# Patient Record
Sex: Female | Born: 1970 | Race: White | Hispanic: No | Marital: Married | Smoking: Current every day smoker
Health system: Southern US, Community
[De-identification: ages and names within clinical notes are randomized; demographics above are authoritative.]

## PROBLEM LIST (undated history)

## (undated) ENCOUNTER — Inpatient Hospital Stay (HOSPITAL_COMMUNITY): Payer: Self-pay

## (undated) DIAGNOSIS — T8859XA Other complications of anesthesia, initial encounter: Secondary | ICD-10-CM

## (undated) DIAGNOSIS — N879 Dysplasia of cervix uteri, unspecified: Secondary | ICD-10-CM

## (undated) DIAGNOSIS — O24419 Gestational diabetes mellitus in pregnancy, unspecified control: Secondary | ICD-10-CM

## (undated) DIAGNOSIS — Z8669 Personal history of other diseases of the nervous system and sense organs: Secondary | ICD-10-CM

## (undated) DIAGNOSIS — M1990 Unspecified osteoarthritis, unspecified site: Secondary | ICD-10-CM

## (undated) DIAGNOSIS — L719 Rosacea, unspecified: Secondary | ICD-10-CM

## (undated) DIAGNOSIS — Z720 Tobacco use: Secondary | ICD-10-CM

## (undated) DIAGNOSIS — L80 Vitiligo: Secondary | ICD-10-CM

## (undated) DIAGNOSIS — K141 Geographic tongue: Secondary | ICD-10-CM

## (undated) DIAGNOSIS — F429 Obsessive-compulsive disorder, unspecified: Secondary | ICD-10-CM

## (undated) DIAGNOSIS — A630 Anogenital (venereal) warts: Secondary | ICD-10-CM

## (undated) DIAGNOSIS — R011 Cardiac murmur, unspecified: Secondary | ICD-10-CM

## (undated) DIAGNOSIS — C801 Malignant (primary) neoplasm, unspecified: Secondary | ICD-10-CM

## (undated) DIAGNOSIS — T4145XA Adverse effect of unspecified anesthetic, initial encounter: Secondary | ICD-10-CM

## (undated) DIAGNOSIS — Z8489 Family history of other specified conditions: Secondary | ICD-10-CM

## (undated) DIAGNOSIS — F419 Anxiety disorder, unspecified: Secondary | ICD-10-CM

## (undated) DIAGNOSIS — R0981 Nasal congestion: Secondary | ICD-10-CM

## (undated) DIAGNOSIS — G4730 Sleep apnea, unspecified: Secondary | ICD-10-CM

## (undated) DIAGNOSIS — Z87442 Personal history of urinary calculi: Secondary | ICD-10-CM

## (undated) DIAGNOSIS — E559 Vitamin D deficiency, unspecified: Secondary | ICD-10-CM

## (undated) DIAGNOSIS — N6310 Unspecified lump in the right breast, unspecified quadrant: Secondary | ICD-10-CM

## (undated) HISTORY — PX: GYNECOLOGIC CRYOSURGERY: SHX857

## (undated) HISTORY — PX: OTHER SURGICAL HISTORY: SHX169

## (undated) HISTORY — PX: MICRODISCECTOMY LUMBAR: SUR864

## (undated) HISTORY — DX: Personal history of other diseases of the nervous system and sense organs: Z86.69

## (undated) HISTORY — DX: Anogenital (venereal) warts: A63.0

## (undated) HISTORY — DX: Vitiligo: L80

## (undated) HISTORY — DX: Obsessive-compulsive disorder, unspecified: F42.9

## (undated) HISTORY — DX: Cardiac murmur, unspecified: R01.1

## (undated) HISTORY — DX: Sleep apnea, unspecified: G47.30

## (undated) HISTORY — DX: Dysplasia of cervix uteri, unspecified: N87.9

## (undated) HISTORY — PX: REDUCTION MAMMAPLASTY: SUR839

## (undated) HISTORY — DX: Unspecified osteoarthritis, unspecified site: M19.90

## (undated) HISTORY — DX: Anxiety disorder, unspecified: F41.9

## (undated) HISTORY — PX: BREAST EXCISIONAL BIOPSY: SUR124

## (undated) HISTORY — DX: Vitamin D deficiency, unspecified: E55.9

## (undated) HISTORY — DX: Geographic tongue: K14.1

## (undated) HISTORY — DX: Tobacco use: Z72.0

## (undated) HISTORY — DX: Rosacea, unspecified: L71.9

## (undated) HISTORY — PX: BREAST SURGERY: SHX581

---
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
## 1998-08-26 HISTORY — PX: TONSILLECTOMY: SUR1361

## 2006-08-26 HISTORY — PX: ABDOMINOPLASTY: SUR9

## 2007-01-28 ENCOUNTER — Encounter: Admission: RE | Admit: 2007-01-28 | Discharge: 2007-01-28 | Payer: Self-pay | Admitting: Family Medicine

## 2007-02-02 ENCOUNTER — Encounter: Admission: RE | Admit: 2007-02-02 | Discharge: 2007-02-02 | Payer: Self-pay | Admitting: Family Medicine

## 2007-07-07 ENCOUNTER — Other Ambulatory Visit: Admission: RE | Admit: 2007-07-07 | Discharge: 2007-07-07 | Payer: Self-pay | Admitting: Obstetrics and Gynecology

## 2008-04-04 ENCOUNTER — Encounter: Admission: RE | Admit: 2008-04-04 | Discharge: 2008-04-04 | Payer: Self-pay | Admitting: Family Medicine

## 2008-07-13 ENCOUNTER — Other Ambulatory Visit: Admission: RE | Admit: 2008-07-13 | Discharge: 2008-07-13 | Payer: Self-pay | Admitting: Family Medicine

## 2008-08-09 ENCOUNTER — Encounter: Admission: RE | Admit: 2008-08-09 | Discharge: 2008-08-09 | Payer: Self-pay | Admitting: Family Medicine

## 2009-05-30 ENCOUNTER — Encounter: Admission: RE | Admit: 2009-05-30 | Discharge: 2009-05-30 | Payer: Self-pay | Admitting: Family Medicine

## 2009-08-03 ENCOUNTER — Other Ambulatory Visit: Admission: RE | Admit: 2009-08-03 | Discharge: 2009-08-03 | Payer: Self-pay | Admitting: Family Medicine

## 2009-08-14 ENCOUNTER — Ambulatory Visit: Payer: Self-pay | Admitting: Gynecology

## 2009-09-13 ENCOUNTER — Ambulatory Visit: Payer: Self-pay | Admitting: Gynecology

## 2009-10-11 ENCOUNTER — Ambulatory Visit: Payer: Self-pay | Admitting: Gynecology

## 2009-10-12 ENCOUNTER — Ambulatory Visit: Payer: Self-pay | Admitting: Gynecology

## 2010-01-31 ENCOUNTER — Ambulatory Visit: Payer: Self-pay | Admitting: Gynecology

## 2010-03-09 ENCOUNTER — Ambulatory Visit: Payer: Self-pay | Admitting: Gynecology

## 2010-03-22 ENCOUNTER — Ambulatory Visit: Payer: Self-pay | Admitting: Gynecology

## 2010-09-16 ENCOUNTER — Encounter: Payer: Self-pay | Admitting: Family Medicine

## 2010-10-01 ENCOUNTER — Encounter: Payer: Self-pay | Admitting: Gynecology

## 2010-10-01 ENCOUNTER — Other Ambulatory Visit (HOSPITAL_COMMUNITY)
Admission: RE | Admit: 2010-10-01 | Discharge: 2010-10-01 | Disposition: A | Discharge status: Home / Self Care | Payer: BC Managed Care – PPO | Source: Ambulatory Visit | Attending: Gynecology | Admitting: Gynecology

## 2010-10-01 ENCOUNTER — Other Ambulatory Visit: Payer: Self-pay | Admitting: Gynecology

## 2010-10-01 DIAGNOSIS — R5383 Other fatigue: Secondary | ICD-10-CM

## 2010-10-01 DIAGNOSIS — Z833 Family history of diabetes mellitus: Secondary | ICD-10-CM

## 2010-10-01 DIAGNOSIS — Z01419 Encounter for gynecological examination (general) (routine) without abnormal findings: Secondary | ICD-10-CM

## 2010-10-01 DIAGNOSIS — Z13220 Encounter for screening for lipoid disorders: Secondary | ICD-10-CM

## 2010-10-01 DIAGNOSIS — R5381 Other malaise: Secondary | ICD-10-CM

## 2010-10-01 DIAGNOSIS — Z124 Encounter for screening for malignant neoplasm of cervix: Secondary | ICD-10-CM | POA: Insufficient documentation

## 2010-11-26 ENCOUNTER — Other Ambulatory Visit: Payer: BC Managed Care – PPO

## 2011-03-25 ENCOUNTER — Encounter: Payer: Self-pay | Admitting: *Deleted

## 2011-03-26 ENCOUNTER — Ambulatory Visit (INDEPENDENT_AMBULATORY_CARE_PROVIDER_SITE_OTHER): Payer: BC Managed Care – PPO | Admitting: Gynecology

## 2011-03-26 ENCOUNTER — Encounter: Payer: Self-pay | Admitting: Gynecology

## 2011-03-26 DIAGNOSIS — Z113 Encounter for screening for infections with a predominantly sexual mode of transmission: Secondary | ICD-10-CM

## 2011-03-26 DIAGNOSIS — E559 Vitamin D deficiency, unspecified: Secondary | ICD-10-CM

## 2011-03-26 DIAGNOSIS — N898 Other specified noninflammatory disorders of vagina: Secondary | ICD-10-CM

## 2011-03-26 DIAGNOSIS — B373 Candidiasis of vulva and vagina: Secondary | ICD-10-CM

## 2011-03-26 DIAGNOSIS — K649 Unspecified hemorrhoids: Secondary | ICD-10-CM

## 2011-03-26 MED ORDER — FLUCONAZOLE 150 MG PO TABS: 150.0000 mg | ORAL_TABLET | Freq: Once | ORAL | Status: AC

## 2011-03-26 NOTE — Progress Notes (Signed)
Patient presentscomplaining of vaginal discharge for several days with itching think she has a yeast infection. She also requested STD screening. She has no known exposure but wants to be checked for STD. She also has been noted to have a low vitamin D asked if she could have her vitamin D level rechecked. She had been on 50,000 units weekly through the winter but then never followed up for a recheck. Lastly she is complaining of hemorrhoid comes and goes.  Exam today: External BUS the vagina with white discharge KOH wet prep done cervix grossly normal GC Chlamydia screen done IUD string not visualized bimanual uterus normal size midline mobile nontender adnexa without masses or tenderness rectal exam shows small hemorrhoidal skin tag digital rectal exam is normal.  Assessment #1 Vaginal discharge KOH were prepped is positive for yeast we'll treat with Diflucan 150x1 dose I gave her 3 refills as she does have recurrences.  #2 Patient requests STD screening no known exposure will check GC chlamydia RPR hep B hep C HIV. #3 IUD management IUD string was not visualized but it has not been visualized in the past and she had an ultrasound in February of last year that showed intrauterine placement. She remains amenorrheic. Do not feel any further evaluation necessary. #4 Complaints of hemorrhoid exam today shows a small hemorrhoidal tag no overt hemorrhoids. Patient notes that it seems to pop in and out is bothersome to her I offered her a general surgical referral and she wants to go ahead and do this I went ahead and arrange this today. She knows if she does not hear from [place] as far as a referral within 2 weeks to call us. #5 Patient's been known to have low vitamin D in the past has been taking a vitamin D supplement that I went ahead and recheck a vitamin D level today she'll followup with these results.

## 2011-03-27 ENCOUNTER — Encounter: Payer: Self-pay | Admitting: Gynecology

## 2011-03-27 ENCOUNTER — Encounter: Payer: Self-pay | Admitting: *Deleted

## 2011-03-27 ENCOUNTER — Other Ambulatory Visit: Payer: Self-pay | Admitting: Gynecology

## 2011-03-27 LAB — HIV ANTIBODY (ROUTINE TESTING W REFLEX): HIV: NONREACTIVE

## 2011-03-27 LAB — RPR

## 2011-03-27 LAB — HEPATITIS C ANTIBODY: HCV Ab: NEGATIVE

## 2011-03-27 LAB — HEPATITIS B SURFACE ANTIGEN: Hepatitis B Surface Ag: NEGATIVE

## 2011-03-27 LAB — VITAMIN D 25 HYDROXY (VIT D DEFICIENCY, FRACTURES): Vit D, 25-Hydroxy: 35 ng/mL (ref 30–89)

## 2011-03-27 NOTE — Progress Notes (Unsigned)
  Patient was notified of appointment with [facility].  She will see Dr. Zachery Dakins on 04/04/11 @3 :50pm.

## 2011-03-27 NOTE — Progress Notes (Signed)
PT INFORMED WITH THE BELOW RE:VIT.D

## 2011-04-04 ENCOUNTER — Ambulatory Visit (INDEPENDENT_AMBULATORY_CARE_PROVIDER_SITE_OTHER): Payer: Self-pay | Admitting: General Surgery

## 2011-04-16 ENCOUNTER — Ambulatory Visit (INDEPENDENT_AMBULATORY_CARE_PROVIDER_SITE_OTHER): Payer: BC Managed Care – PPO | Admitting: General Surgery

## 2011-05-13 ENCOUNTER — Telehealth: Payer: Self-pay | Admitting: *Deleted

## 2011-05-13 DIAGNOSIS — N898 Other specified noninflammatory disorders of vagina: Secondary | ICD-10-CM

## 2011-05-13 MED ORDER — METRONIDAZOLE 500 MG PO TABS: 500.0000 mg | ORAL_TABLET | Freq: Two times a day (BID) | ORAL | Status: AC

## 2011-05-13 NOTE — Telephone Encounter (Signed)
Pt informed with the below note and make appointment if s/s continue after taking medications.

## 2011-05-13 NOTE — Telephone Encounter (Signed)
Flagyl 500 mg twice a Pepin x7 days. Alcohol avoidance. Office visit if symptoms persist

## 2011-05-13 NOTE — Telephone Encounter (Signed)
Pt called c/o foul odor x 1 Goble. Pt would like rx for this, she states that you give this to her when she has recurrent infections. Please advise if pt can have rx for this. Last office visit 10/01/10.

## 2011-05-15 ENCOUNTER — Encounter (INDEPENDENT_AMBULATORY_CARE_PROVIDER_SITE_OTHER): Payer: Self-pay | Admitting: General Surgery

## 2011-05-15 ENCOUNTER — Ambulatory Visit (INDEPENDENT_AMBULATORY_CARE_PROVIDER_SITE_OTHER): Payer: BC Managed Care – PPO | Admitting: General Surgery

## 2011-05-15 VITALS — BP 122/82 | HR 60 | Temp 97.6°F | Resp 16 | Ht 64.0 in | Wt 172.0 lb

## 2011-05-15 DIAGNOSIS — K644 Residual hemorrhoidal skin tags: Secondary | ICD-10-CM

## 2011-05-15 NOTE — Progress Notes (Signed)
Subjective:     Patient ID: Mackenzie Meyers, female   DOB: 1971-07-15, 40 y.o.   MRN: [id number]  HPIThe patient is a 40 year old mother of 2 and said to her second pregnancy approximately 5 years ago she started having mild episodes of problems with hemorrhoids. At present she does have an episode of prolapse and hemorrhoids intermittently that usually spontaneously reduces. She is interested whether this could be surgically managed and I reviewed the booklet on hemorrhoids with her. She's never had a previous colonoscopy and bleeding is not a significant problem   Review of Systems Current Outpatient Prescriptions  Medication Sig Dispense Refill  . Citalopram Hydrobromide (CELEXA PO) Take by mouth.        . levonorgestrel (MIRENA) 20 MCG/24HR IUD 1 each by Intrauterine route once. Inserted 09/13/09       . Multiple Vitamin (MULTIVITAMIN) capsule Take 1 capsule by mouth daily.        . metroNIDAZOLE (FLAGYL) 500 MG tablet Take 1 tablet (500 mg total) by mouth 2 (two) times daily. For 7 days.  Avoid alcohol while taking  14 tablet  0   Allergies  Allergen Reactions  . Ampicillin   . Erythrocin    Past Surgical History  Procedure Date  . Cesarean section 2004 and 2007  . Tonsillectomy 2000  . Breast enhancement surgery 2008  . Abdominoplasty   . Intrauterine device insertion 08/2009    Mirena        Objective:   Physical Exam On physical exam predominantly to the anus she is not have any obvious external thrombosed own large hemorrhoids at a careful anoscopic exam she's got mild internal hemorrhoids in all 3 quadrants noted larger than the others and she says that the area of the left is probably the hemorrhoid the intermittently she says she does home of [place] [employer] straining that sometimes diuretics her hemorrhoids. Her present weight is about 170 pounds her maximum weight was after her pregnancy of about 200 to    Assessment:    Mild internal hemorrhoid with intermittent prolapse.  She does have a history of vaginal infection and has full prescription for Diflucan and was given a prescription 200 mg 2 tablets one takedown repeated approximately 4 days.   Plan:      Patient has a humeral booklet will try to avoid prolonged strain in keep her bowels regular and return to see [place] if she is having problems with intermittent prolapse and if this is an extra L. hemorrhoids it could possibly be removed here in the office and I don't see anything as far as a candidate for rubber band ligation at this time

## 2011-05-15 NOTE — Patient Instructions (Signed)
Regulate her stools to avoid constipation also avoid prolonged sitting on the toilet. If the hemorrhoid continues to intermittent prolapse but he reexamine here in the office. Possible all for surgery to correct the problem if it becomes worse

## 2011-10-08 ENCOUNTER — Encounter: Payer: BC Managed Care – PPO | Admitting: Gynecology

## 2011-10-15 ENCOUNTER — Encounter: Payer: BC Managed Care – PPO | Admitting: Gynecology

## 2011-10-17 ENCOUNTER — Other Ambulatory Visit (HOSPITAL_COMMUNITY)
Admission: RE | Admit: 2011-10-17 | Discharge: 2011-10-17 | Disposition: A | Discharge status: Home / Self Care | Payer: PRIVATE HEALTH INSURANCE | Source: Ambulatory Visit | Attending: Gynecology | Admitting: Gynecology

## 2011-10-17 ENCOUNTER — Encounter: Payer: Self-pay | Admitting: Gynecology

## 2011-10-17 ENCOUNTER — Ambulatory Visit (INDEPENDENT_AMBULATORY_CARE_PROVIDER_SITE_OTHER): Payer: PRIVATE HEALTH INSURANCE | Admitting: Gynecology

## 2011-10-17 VITALS — BP 120/74 | Ht 64.0 in | Wt 174.0 lb

## 2011-10-17 DIAGNOSIS — Z13220 Encounter for screening for lipoid disorders: Secondary | ICD-10-CM

## 2011-10-17 DIAGNOSIS — L80 Vitiligo: Secondary | ICD-10-CM

## 2011-10-17 DIAGNOSIS — E559 Vitamin D deficiency, unspecified: Secondary | ICD-10-CM

## 2011-10-17 DIAGNOSIS — Z01419 Encounter for gynecological examination (general) (routine) without abnormal findings: Secondary | ICD-10-CM | POA: Insufficient documentation

## 2011-10-17 DIAGNOSIS — F419 Anxiety disorder, unspecified: Secondary | ICD-10-CM | POA: Insufficient documentation

## 2011-10-17 DIAGNOSIS — Z113 Encounter for screening for infections with a predominantly sexual mode of transmission: Secondary | ICD-10-CM

## 2011-10-17 DIAGNOSIS — Z131 Encounter for screening for diabetes mellitus: Secondary | ICD-10-CM

## 2011-10-17 LAB — LIPID PANEL
Cholesterol: 163 mg/dL (ref 0–200)
HDL: 61 mg/dL (ref >39)
LDL Cholesterol: 93 mg/dL (ref 0–99)
Total CHOL/HDL Ratio: 2.7 Ratio
Triglycerides: 45 mg/dL (ref <150)
VLDL: 9 mg/dL (ref 0–40)

## 2011-10-17 LAB — CBC WITH DIFFERENTIAL/PLATELET
Basophils Absolute: 0 10*3/uL (ref 0.0–0.1)
Basophils Relative: 0 % (ref 0–1)
Eosinophils Absolute: 0.1 10*3/uL (ref 0.0–0.7)
Eosinophils Relative: 1 % (ref 0–5)
HCT: 44.3 % (ref 36.0–46.0)
Hemoglobin: 14.4 g/dL (ref 12.0–15.0)
Lymphocytes Relative: 23 % (ref 12–46)
Lymphs Abs: 2.1 10*3/uL (ref 0.7–4.0)
MCH: 29.8 pg (ref 26.0–34.0)
MCHC: 32.5 g/dL (ref 30.0–36.0)
MCV: 91.7 fL (ref 78.0–100.0)
Monocytes Absolute: 0.7 10*3/uL (ref 0.1–1.0)
Monocytes Relative: 8 % (ref 3–12)
Neutro Abs: 6.1 10*3/uL (ref 1.7–7.7)
Neutrophils Relative %: 68 % (ref 43–77)
Platelets: 273 10*3/uL (ref 150–400)
RBC: 4.83 MIL/uL (ref 3.87–5.11)
RDW: 14.2 % (ref 11.5–15.5)
WBC: 9 10*3/uL (ref 4.0–10.5)

## 2011-10-17 LAB — GLUCOSE, RANDOM: Glucose, Bld: 88 mg/dL (ref 70–99)

## 2011-10-17 MED ORDER — CITALOPRAM HYDROBROMIDE 20 MG PO TABS: 20.0000 mg | ORAL_TABLET | Freq: Every day | ORAL | Status: DC

## 2011-10-17 NOTE — Patient Instructions (Signed)
Call for lab work. Return in one year for annual gynecologic exam.  Consider Stop Smoking.  Help is available at [facility] smoking cessation program @ [email or web address] or 308 608 7637. OR 1-800-QUIT-NOW (715)869-7039) for free smoking cessation counseling.   Smoking Hazards Smoking cigarettes is extremely bad for your health. Tobacco smoke has over 200 known poisons in it. There are over 60 chemicals in tobacco smoke that cause cancer. Some of the chemicals found in cigarette smoke include:  Cyanide.  Benzene.  Formaldehyde.  Methanol (wood alcohol).  Acetylene (fuel used in welding torches).  Ammonia.  Cigarette smoke also contains the poisonous gases nitrogen oxide and carbon monoxide.  Cigarette smokers have an increased risk of many serious medical problems, including: Lung cancer.  Lung disease (such as pneumonia, bronchitis, and emphysema).  Heart attack and chest pain due to the heart not getting enough oxygen (angina).  Heart disease and peripheral blood vessel disease.  Hypertension.  Stroke.  Oral cancer (cancer of the lip, mouth, or voice box).  Bladder cancer.  Pancreatic cancer.  Cervical cancer.  Pregnancy complications, including premature birth.  Low birthweight babies.  Early menopause.  Lower estrogen level for women.  Infertility.  Facial wrinkles.  Blindness.  Increased risk of broken bones (fractures).  Senile dementia.  Stillbirths and smaller newborn babies, birth defects, and genetic damage to sperm.  Stomach ulcers and internal bleeding.  Children of smokers have an increased risk of the following, because of secondhand smoke exposure:  Sudden infant death syndrome (SIDS).  Respiratory infections.  Lung cancer.  Heart disease.  Ear infections.  Smoking causes approximately: 90% of all lung cancer deaths in men.  80% of all lung cancer deaths in women.  90% of deaths from chronic obstructive lung disease.  Compared with  nonsmokers, smoking increases the risk of: Coronary heart disease by 2 to 4 times.  Stroke by 2 to 4 times.  Men developing lung cancer by 23 times.  Women developing lung cancer by 13 times.  Dying from chronic obstructive lung diseases by 12 times.  Someone who smokes 2 packs a [place] loses about 8 years of his or her life. Even smoking lightly shortens your life expectancy by several years. You can greatly reduce the risk of medical problems for you and your family by stopping now. Smoking is the most preventable cause of death and disease in our society. Within days of quitting smoking, your circulation returns to normal, you decrease the risk of having a heart attack, and your lung capacity improves. There may be some increased phlegm in the first few days after quitting, and it may take months for your lungs to clear up completely. Quitting for 10 years cuts your lung cancer risk to almost that of a nonsmoker. WHY IS SMOKING ADDICTIVE? Nicotine is the chemical agent in tobacco that is capable of causing addiction or dependence.  When you smoke and inhale, nicotine is absorbed rapidly into the bloodstream through your lungs. Nicotine absorbed through the lungs is capable of creating a powerful addiction. Both inhaled and non-inhaled nicotine may be addictive.  Addiction studies of cigarettes and spit tobacco show that addiction to nicotine occurs mainly during the teen years, when young people begin using tobacco products.  WHAT ARE THE BENEFITS OF QUITTING?  There are many health benefits to quitting smoking.  Likelihood of developing cancer and heart disease decreases. Health improvements are seen almost immediately.  Blood pressure, pulse rate, and breathing patterns start returning to normal soon after  quitting.  People who quit may see an improvement in their overall quality of life.  Some people choose to quit all at once. Other options include nicotine replacement products, such as patches,  gum, and nasal sprays. Do not use these products without first checking with your caregiver. QUITTING SMOKING It is not easy to quit smoking. Nicotine is addicting, and longtime habits are hard to change. To start, you can write down all your reasons for quitting, tell your family and friends you want to quit, and ask for their help. Throw your cigarettes away, chew gum or cinnamon sticks, keep your hands busy, and drink extra water or juice. Go for walks and practice deep breathing to relax. Think of all the money you are saving: around $1,000 a year, for the average pack-a-Veloso smoker. Nicotine patches and gum have been shown to improve success at efforts to stop smoking. Zyban (bupropion) is an anti-depressant drug that can be prescribed to reduce nicotine withdrawal symptoms and to suppress the urge to smoke. Smoking is an addiction with both physical and psychological effects. Joining a stop-smoking support group can help you cope with the emotional issues. For more information and advice on programs to stop smoking, call your doctor, your local hospital, or these organizations: American Lung Association - 1-800-LUNGUSA  American Cancer Society - 1-800-ACS-2345  Document Released: 09/19/2004 Document Revised: 04/24/2011 Document Reviewed: 05/24/2009 [facility] Patient Information 2012 [place], [place].  Smoking Cessation This document explains the best ways for you to quit smoking and new treatments to help. It lists new medicines that can double or triple your chances of quitting and quitting for good. It also considers ways to avoid relapses and concerns you may have about quitting, including weight gain. NICOTINE: A POWERFUL ADDICTION If you have tried to quit smoking, you know how hard it can be. It is hard because nicotine is a very addictive drug. For some people, it can be as addictive as heroin or cocaine. Usually, people make 2 or 3 tries, or more, before finally being able to quit. Each time  you try to quit, you can learn about what helps and what hurts. Quitting takes hard work and a lot of effort, but you can quit smoking. QUITTING SMOKING IS ONE OF THE MOST IMPORTANT THINGS YOU WILL EVER DO.  You will live longer, feel better, and live better.   The impact on your body of quitting smoking is felt almost immediately:   Within 20 minutes, blood pressure decreases. Pulse returns to its normal level.   After 8 hours, carbon monoxide levels in the blood return to normal. Oxygen level increases.   After 24 hours, chance of heart attack starts to decrease. Breath, hair, and body stop smelling like smoke.   After 48 hours, damaged nerve endings begin to recover. Sense of taste and smell improve.   After 72 hours, the body is virtually free of nicotine. Bronchial tubes relax and breathing becomes easier.   After 2 to 12 weeks, lungs can hold more air. Exercise becomes easier and circulation improves.   Quitting will reduce your risk of having a heart attack, stroke, cancer, or lung disease:   After 1 year, the risk of coronary heart disease is cut in half.   After 5 years, the risk of stroke falls to the same as a nonsmoker.   After 10 years, the risk of lung cancer is cut in half and the risk of other cancers decreases significantly.   After 15 years, the  risk of coronary heart disease drops, usually to the level of a nonsmoker.   If you are pregnant, quitting smoking will improve your chances of having a healthy baby.   The people you live with, especially your children, will be healthier.   You will have extra money to spend on things other than cigarettes.  FIVE KEYS TO QUITTING Studies have shown that these 5 steps will help you quit smoking and quit for good. You have the best chances of quitting if you use them together: 1. Get ready.  2. Get support and encouragement.  3. Learn new skills and behaviors.  4. Get medicine to reduce your nicotine addiction and use  it correctly.  5. Be prepared for relapse or difficult situations. Be determined to continue trying to quit, even if you do not succeed at first.  1. GET READY  Set a quit date.   Change your environment.   Get rid of ALL cigarettes, ashtrays, matches, and lighters in your home, car, and place of work.   Do not let people smoke in your home.   Review your past attempts to quit. Think about what worked and what did not.   Once you quit, do not smoke. NOT EVEN A PUFF!  2. GET SUPPORT AND ENCOURAGEMENT Studies have shown that you have a better chance of being successful if you have help. You can get support in many ways.  Tell your family, friends, and coworkers that you are going to quit and need their support. Ask them not to smoke around you.   Talk to your caregivers (doctor, dentist, nurse, pharmacist, psychologist, and/or smoking counselor).   Get individual, group, or telephone counseling and support. The more counseling you have, the better your chances are of quitting. Programs are available at [employer] and health centers. Call your local health department for information about programs in your area.   Spiritual beliefs and practices may help some smokers quit.   Quit meters are Photographer that keep track of quit statistics, such as amount of "quit-time," cigarettes not smoked, and money saved.   Many smokers find one or more of the many self-help books available useful in helping them quit and stay off tobacco.  3. LEARN NEW SKILLS AND BEHAVIORS  Try to distract yourself from urges to smoke. Talk to someone, go for a walk, or occupy your time with a task.   When you first try to quit, change your routine. Take a different route to work. Drink tea instead of coffee. Eat breakfast in a different place.   Do something to reduce your stress. Take a hot bath, exercise, or read a book.   Plan something enjoyable to do every [place]. Reward  yourself for not smoking.   Explore interactive web-based programs that specialize in helping you quit.  4. GET MEDICINE AND USE IT CORRECTLY Medicines can help you stop smoking and decrease the urge to smoke. Combining medicine with the above behavioral methods and support can quadruple your chances of successfully quitting smoking. The U.S. Food and Drug Administration (FDA) has approved 7 medicines to help you quit smoking. These medicines fall into 3 categories.  Nicotine replacement therapy (delivers nicotine to your body without the negative effects and risks of smoking):   Nicotine gum: Available over-the-counter.   Nicotine lozenges: Available over-the-counter.   Nicotine inhaler: Available by prescription.   Nicotine nasal spray: Available by prescription.   Nicotine skin patches (transdermal): Available by  prescription and over-the-counter.   Antidepressant medicine (helps people abstain from smoking, but how this works is unknown):   Bupropion sustained-release (SR) tablets: Available by prescription.   Nicotinic receptor partial agonist (simulates the effect of nicotine in your brain):   Varenicline tartrate tablets: Available by prescription.   Ask your caregiver for advice about which medicines to use and how to use them. Carefully read the information on the package.   Everyone who is trying to quit may benefit from using a medicine. If you are pregnant or trying to become pregnant, nursing an infant, you are under age 77, or you smoke fewer than 10 cigarettes per [place], talk to your caregiver before taking any nicotine replacement medicines.   You should stop using a nicotine replacement product and call your caregiver if you experience nausea, dizziness, weakness, vomiting, fast or irregular heartbeat, mouth problems with the lozenge or gum, or redness or swelling of the skin around the patch that does not go away.   Do not use any other product containing nicotine  while using a nicotine replacement product.   Talk to your caregiver before using these products if you have diabetes, heart disease, asthma, stomach ulcers, you had a recent heart attack, you have high blood pressure that is not controlled with medicine, a history of irregular heartbeat, or you have been prescribed medicine to help you quit smoking.  5. BE PREPARED FOR RELAPSE OR DIFFICULT SITUATIONS  Most relapses occur within the first 3 months after quitting. Do not be discouraged if you start smoking again. Remember, most people try several times before they finally quit.   You may have symptoms of withdrawal because your body is used to nicotine. You may crave cigarettes, be irritable, feel very hungry, cough often, get headaches, or have difficulty concentrating.   The withdrawal symptoms are only temporary. They are strongest when you first quit, but they will go away within 10 to 14 days.  Here are some difficult situations to watch for:  Alcohol. Avoid drinking alcohol. Drinking lowers your chances of successfully quitting.   Caffeine. Try to reduce the amount of caffeine you consume. It also lowers your chances of successfully quitting.   Other smokers. Being around smoking can make you want to smoke. Avoid smokers.   Weight gain. Many smokers will gain weight when they quit, usually less than 10 pounds. Eat a healthy diet and stay active. Do not let weight gain distract you from your main goal, quitting smoking. Some medicines that help you quit smoking may also help delay weight gain. You can always lose the weight gained after you quit.   Bad mood or depression. There are a lot of ways to improve your mood other than smoking.  If you are having problems with any of these situations, talk to your caregiver. SPECIAL SITUATIONS AND CONDITIONS Studies suggest that everyone can quit smoking. Your situation or condition can give you a special reason to quit.  Pregnant women/new  mothers: By quitting, you protect your baby's health and your own.   Hospitalized patients: By quitting, you reduce health problems and help healing.   Heart attack patients: By quitting, you reduce your risk of a second heart attack.   Lung, head, and neck cancer patients: By quitting, you reduce your chance of a second cancer.   Parents of children and adolescents: By quitting, you protect your children from illnesses caused by secondhand smoke.  QUESTIONS TO THINK ABOUT Think about the following questions before  you try to stop smoking. You may want to talk about your answers with your caregiver.  Why do you want to quit?   If you tried to quit in the past, what helped and what did not?   What will be the most difficult situations for you after you quit? How will you plan to handle them?   Who can help you through the tough times? Your family? Friends? Caregiver?   What pleasures do you get from smoking? What ways can you still get pleasure if you quit?  Here are some questions to ask your caregiver:  How can you help me to be successful at quitting?   What medicine do you think would be best for me and how should I take it?   What should I do if I need more help?   What is smoking withdrawal like? How can I get information on withdrawal?  Quitting takes hard work and a lot of effort, but you can quit smoking. FOR MORE INFORMATION  Smokefree.gov (http://www.davis-[place].com/) provides free, accurate, evidence-based information and professional assistance to help support the immediate and long-term needs of people trying to quit smoking. Document Released: 08/06/2001 Document Revised: 04/24/2011 Document Reviewed: 05/29/2009 [facility] Patient Information 2012 [place], [place].

## 2011-10-17 NOTE — Progress Notes (Signed)
Mackenzie Meyers March 01, 1971 [id number]        41 y.o.  for annual exam.  Doing well.  Past medical history,surgical history, medications, allergies, family history and social history were all reviewed and documented in the EPIC chart. ROS:  Was performed and pertinent positives and negatives are included in the history.  Exam: [name] chaperone present Filed Vitals:   10/17/11 1048  BP: 120/74   General appearance  Normal Skin vitiligo changes Head/Neck normal with no cervical or supraclavicular adenopathy thyroid normal Lungs  clear Cardiac RR, without RMG Abdominal  soft, nontender, without masses, organomegaly or hernia Breasts  examined lying and sitting.  Left without masses, retractions, discharge or axillary adenopathy. Well healed lift scars.  Right with nodularity in the tail of Spence, without retractions discharge or axillary adenopathy. Well-healed lift scars. Pelvic  Ext/BUS/vagina  normal   Cervix  normal  Pap done, IUD string not visualized  Uterus  anteverted, normal size, shape and contour, midline and mobile nontender   Adnexa  Without masses or tenderness    Anus and perineum  normal   Rectovaginal  normal sphincter tone without palpated masses or tenderness.    Assessment/Plan:  41 y.o. female for annual exam.    1. IUD. Patient has Mirena IUD placed 09/13/2009 and is amenorrheic. I did not see the string as I have not seen in the past. She had an ultrasound that documented intrauterine placement. I reviewed this with her we'll plan expectant management as nothing has changed symptom-wise. 2. Right breast nodularity. Patient has stable nodularity in the right tail of Spence. 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 It has remained stable on self breast exam and she will continue to follow as long it remains stable then we'll follow this. She'll repeat her mammogram which is due at age 51  per radiology recommendation on last mammogram. 3. Pap smear. Pap was done today. 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 Assuming this Pap is normal then I discussed possible decreased frequency was every 3 years per current screening guidelines. 4. Anxiety. Patient was diagnosed with anxiety by her primary and is on Celexa. She asked about refill this as she's doing well with this I refilled her 20 mg daily x1 year. 5. STD screening. Patient asked for STD screening. She has no known exposure history but just wants to be screened. Baseline GC Chlamydia HIV RPR hepatitis B. Hepatitis C ordered. 6. Vitiligo. Patient asked about checking a TSH as this apparently associated with vitiligo and I ordered this for her. 7. Vitamin D deficiency. She is known to have a low vitamin D has not extra vitamin D now. I'm the level ordered. 8. Health maintenance. Baseline CBC glucose urinalysis and lipid profile ordered in addition to the above labs. Assuming patient continues well from a gynecologic standpoint she will see me in a year, sooner as needed.    Dara Lords MD, 11:16 AM 10/17/2011

## 2011-10-18 LAB — URINALYSIS W MICROSCOPIC + REFLEX CULTURE
Bacteria, UA: NONE SEEN
Bilirubin Urine: NEGATIVE
Casts: NONE SEEN
Crystals: NONE SEEN
Glucose, UA: NEGATIVE mg/dL
Hgb urine dipstick: NEGATIVE
Ketones, ur: NEGATIVE mg/dL
Leukocytes, UA: NEGATIVE
Nitrite: NEGATIVE
Protein, ur: NEGATIVE mg/dL
Specific Gravity, Urine: 1.008 (ref 1.005–1.030)
Squamous Epithelial / LPF: NONE SEEN
Urobilinogen, UA: 0.2 mg/dL (ref 0.0–1.0)
pH: 7 (ref 5.0–8.0)

## 2011-10-18 LAB — VITAMIN D 25 HYDROXY (VIT D DEFICIENCY, FRACTURES): Vit D, 25-Hydroxy: 56 ng/mL (ref 30–89)

## 2011-10-18 LAB — HIV ANTIBODY (ROUTINE TESTING W REFLEX): HIV: NONREACTIVE

## 2011-10-18 LAB — TSH: TSH: 1.239 u[IU]/mL (ref 0.350–4.500)

## 2011-10-18 LAB — GC/CHLAMYDIA PROBE AMP, GENITAL
Chlamydia, DNA Probe: NEGATIVE
GC Probe Amp, Genital: NEGATIVE

## 2011-10-18 LAB — RPR

## 2011-10-18 LAB — HEPATITIS B SURFACE ANTIGEN: Hepatitis B Surface Ag: NEGATIVE

## 2011-10-18 LAB — HEPATITIS C ANTIBODY: HCV Ab: NEGATIVE

## 2011-10-25 DIAGNOSIS — Z87442 Personal history of urinary calculi: Secondary | ICD-10-CM

## 2011-10-25 HISTORY — DX: Personal history of urinary calculi: Z87.442

## 2011-11-04 ENCOUNTER — Emergency Department (HOSPITAL_COMMUNITY)
Admission: EM | Admit: 2011-11-04 | Discharge: 2011-11-05 | Disposition: A | Discharge status: Home / Self Care | Payer: PRIVATE HEALTH INSURANCE | Attending: Emergency Medicine | Admitting: Emergency Medicine

## 2011-11-04 ENCOUNTER — Encounter (HOSPITAL_COMMUNITY): Payer: Self-pay | Admitting: Emergency Medicine

## 2011-11-04 ENCOUNTER — Emergency Department (HOSPITAL_COMMUNITY): Payer: PRIVATE HEALTH INSURANCE

## 2011-11-04 DIAGNOSIS — R109 Unspecified abdominal pain: Secondary | ICD-10-CM | POA: Insufficient documentation

## 2011-11-04 DIAGNOSIS — N201 Calculus of ureter: Secondary | ICD-10-CM | POA: Insufficient documentation

## 2011-11-04 MED ORDER — SODIUM CHLORIDE 0.9 % IV BOLUS (SEPSIS)
1000.0000 mL | Freq: Once | INTRAVENOUS | Status: AC
Start: 2011-11-04 — End: 2011-11-05
  Administered 2011-11-05: 1000 mL via INTRAVENOUS

## 2011-11-04 MED ORDER — HYDROMORPHONE HCL PF 1 MG/ML IJ SOLN
1.0000 mg | Freq: Once | INTRAMUSCULAR | Status: AC
  Administered 2011-11-05: 1 mg via INTRAVENOUS
  Filled 2011-11-04: qty 1

## 2011-11-04 MED ORDER — ONDANSETRON HCL 4 MG/2ML IJ SOLN
4.0000 mg | Freq: Once | INTRAMUSCULAR | Status: AC
  Administered 2011-11-05: 4 mg via INTRAVENOUS
  Filled 2011-11-04: qty 2

## 2011-11-04 NOTE — ED Notes (Signed)
Pt states she started having flank pain and diarrhea around 3pm today  Pt states the vomiting started around 5pm  No hx of kidney stones  Pt states the onset of pain was sudden and sharp  Pain is on the right  Actively vomiting in triage

## 2011-11-05 LAB — URINALYSIS, ROUTINE W REFLEX MICROSCOPIC
Bilirubin Urine: NEGATIVE
Glucose, UA: 250 mg/dL — AB
Ketones, ur: 15 mg/dL — AB
Leukocytes, UA: NEGATIVE
Nitrite: NEGATIVE
Protein, ur: NEGATIVE mg/dL
Specific Gravity, Urine: 1.013 (ref 1.005–1.030)
Urobilinogen, UA: 0.2 mg/dL (ref 0.0–1.0)
pH: 7 (ref 5.0–8.0)

## 2011-11-05 LAB — BASIC METABOLIC PANEL
BUN: 15 mg/dL (ref 6–23)
CO2: 22 mEq/L (ref 19–32)
Calcium: 10 mg/dL (ref 8.4–10.5)
Chloride: 99 mEq/L (ref 96–112)
Creatinine, Ser: 0.82 mg/dL (ref 0.50–1.10)
GFR calc Af Amer: >90 mL/min (ref >90)
GFR calc non Af Amer: 90 mL/min — ABNORMAL LOW (ref >90)
Glucose, Bld: 213 mg/dL — ABNORMAL HIGH (ref 70–99)
Potassium: 3.2 mEq/L — ABNORMAL LOW (ref 3.5–5.1)
Sodium: 138 mEq/L (ref 135–145)

## 2011-11-05 LAB — CBC
HCT: 42.5 % (ref 36.0–46.0)
Hemoglobin: 14.6 g/dL (ref 12.0–15.0)
MCH: 30 pg (ref 26.0–34.0)
MCHC: 34.4 g/dL (ref 30.0–36.0)
MCV: 87.3 fL (ref 78.0–100.0)
Platelets: 282 10*3/uL (ref 150–400)
RBC: 4.87 MIL/uL (ref 3.87–5.11)
RDW: 13.4 % (ref 11.5–15.5)
WBC: 20 10*3/uL — ABNORMAL HIGH (ref 4.0–10.5)

## 2011-11-05 LAB — PREGNANCY, URINE: Preg Test, Ur: NEGATIVE

## 2011-11-05 LAB — URINE MICROSCOPIC-ADD ON

## 2011-11-05 MED ORDER — OXYCODONE-ACETAMINOPHEN 5-325 MG PO TABS: 1.0000 | ORAL_TABLET | ORAL | Status: AC | PRN

## 2011-11-05 MED ORDER — ONDANSETRON 8 MG PO TBDP: 8.0000 mg | ORAL_TABLET | Freq: Three times a day (TID) | ORAL | Status: AC | PRN

## 2011-11-05 MED ORDER — KETOROLAC TROMETHAMINE 30 MG/ML IJ SOLN
30.0000 mg | Freq: Once | INTRAMUSCULAR | Status: AC
  Administered 2011-11-05: 30 mg via INTRAVENOUS
  Filled 2011-11-05: qty 1

## 2011-11-05 NOTE — Discharge Instructions (Signed)

## 2011-11-05 NOTE — ED Provider Notes (Signed)
History     CSN: [id number]  Arrival date & time 11/04/11  2142   First MD Initiated Contact with Patient 11/04/11 2312      No chief complaint on file.    The history is provided by the patient.   Patient presents with severe right flank pain with associated nausea and vomiting.  She reports for flank pain radiates around to the right side of her abdomen.  She has no prior history kidney stones.  She reports the pain onset was acute.  Pain is moderate to severe at this time.  She was vomiting in the triage bay.  She also reports a relative sense of diarrhea.  She's had no fevers or chills.  She denies dysuria and urinary frequency.  Nothing worsens her symptoms.  Nothing improves her symptoms.  Her symptoms are constant.   Past Medical History  Diagnosis Date  . Rectal bleeding   . Rectal pain   . Hemorrhoids   . 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 Vitiligo   . Anxiety     OCD    Past Surgical History  Procedure Date  . Cesarean section 2004 and 2007  . Tonsillectomy 2000  . Abdominoplasty   . Augmentation mammaplasty   . Intrauterine device insertion 09/13/2009  . Cryosurgery cervix age 73    Family History  Problem Relation Age of Onset  . Lung cancer Maternal Grandmother   . Cancer Maternal Grandfather   . Cancer Paternal Grandfather     History  Substance Use Topics  . Smoking status: Current Everyday Smoker -- 1.0 packs/Proto for 24 years    Types: Cigarettes  . Smokeless tobacco: Never Used  . Alcohol Use: Yes     rare    OB History    Grav Para Term Preterm Abortions TAB SAB Ect Mult Living                  Review of Systems  All other systems reviewed and are negative.    Allergies  Ampicillin and Erythromycin  Home Medications   Current Outpatient Rx  Name Route Sig Dispense Refill  . VITAMIN D PO Oral Take 1,000 Units by mouth.    Marland Kitchen CITALOPRAM HYDROBROMIDE 20 MG PO TABS Oral Take 1 tablet (20 mg total) by mouth daily. 30 tablet 11  .  LEVONORGESTREL 20 MCG/24HR IU IUD Intrauterine 1 each by Intrauterine route once. Inserted 09/13/09     . LYSINE PO Oral Take by mouth.    . METRONIDAZOLE 500 MG PO TABS Oral Take 500 mg by mouth 2 (two) times daily. For 14 days.    . MULTIVITAMINS PO CAPS Oral Take 1 capsule by mouth daily.      Marland Kitchen ONDANSETRON 8 MG PO TBDP Oral Take 1 tablet (8 mg total) by mouth every 8 (eight) hours as needed for nausea. 10 tablet 0  . OXYCODONE-ACETAMINOPHEN 5-325 MG PO TABS Oral Take 1 tablet by mouth every 4 (four) hours as needed for pain. 25 tablet 0    BP 107/54  Pulse 75  Temp(Src) 98 F (36.7 C) (Oral)  Resp 18  SpO2 99%  Physical Exam  Nursing note and vitals reviewed. Constitutional: She is oriented to person, place, and time. She appears well-developed and well-nourished. No distress.       Uncomfortable appearing  HENT:  Head: Normocephalic and atraumatic.  Eyes: EOM are normal.  Neck: Normal range of motion.  Cardiovascular: Normal rate, regular rhythm and normal heart  sounds.   Pulmonary/Chest: Effort normal and breath sounds normal.  Abdominal: Soft. She exhibits no distension. There is no tenderness.  Genitourinary:       No CVA tenderness  Musculoskeletal: Normal range of motion.  Neurological: She is alert and oriented to person, place, and time.  Skin: Skin is warm and dry.  Psychiatric: She has a normal mood and affect. Judgment normal.    ED Course  Procedures (including critical care time)  Labs Reviewed  CBC - Abnormal; Notable for the following:    WBC 20.0 (*)    All other components within normal limits  BASIC METABOLIC PANEL - Abnormal; Notable for the following:    Potassium 3.2 (*)    Glucose, Bld 213 (*)    GFR calc non Af Amer 90 (*)    All other components within normal limits  URINALYSIS, ROUTINE W REFLEX MICROSCOPIC - Abnormal; Notable for the following:    Glucose, UA 250 (*)    Hgb urine dipstick LARGE (*)    Ketones, ur 15 (*)    All other  components within normal limits  URINE MICROSCOPIC-ADD ON - Abnormal; Notable for the following:    Squamous Epithelial / LPF FEW (*)    All other components within normal limits  PREGNANCY, URINE   Ct Abdomen Pelvis Wo Contrast  11/05/2011  *RADIOLOGY REPORT*  Clinical Data: Right flank pain  CT ABDOMEN AND PELVIS WITHOUT CONTRAST  Technique:  Multidetector CT imaging of the abdomen and pelvis was performed following the standard protocol without intravenous contrast.  Comparison: None.  Findings: 4 mm right ureteral vesicle junction calculus.  Tiny calculus in the upper pole of the right kidney.  Mild right hydronephrosis.  Liver, gallbladder, spleen, pancreas, adrenal glands are within normal limits.  No free fluid.  Normal appendix.  IUD is in the endometrium extending to the fundus.  Adnexa are within normal limits.  IMPRESSION: Right hydronephrosis and 4 mm right ureteral vesicle junction calculus.  Right nephrolithiasis.  Original Report Authenticated By: Donavan Burnet, M.D.   I personally reviewed the CT scan   1. Ureteral stone       MDM  4 mm right ureteral stone.  Pain control the time of discharge.  DC home with urology followup.  Patient understands return to the ER for new or worsening symptoms.  Her white count 20,000 is noted that her urine demonstrates no evidence of infection she is well appearing.  Urine cultures been sent        Lyanne Co, MD 11/05/11 [phone]

## 2011-11-05 NOTE — ED Notes (Signed)
Pt. Discharged to home, pt. Alert and oriented, NAD noted, pt. Ambulatory gait steady

## 2012-08-28 ENCOUNTER — Telehealth: Payer: Self-pay | Admitting: *Deleted

## 2012-08-28 ENCOUNTER — Encounter: Payer: Self-pay | Admitting: Gynecology

## 2012-08-28 ENCOUNTER — Ambulatory Visit (INDEPENDENT_AMBULATORY_CARE_PROVIDER_SITE_OTHER): Payer: Self-pay | Admitting: Gynecology

## 2012-08-28 DIAGNOSIS — N6310 Unspecified lump in the right breast, unspecified quadrant: Secondary | ICD-10-CM

## 2012-08-28 DIAGNOSIS — N63 Unspecified lump in unspecified breast: Secondary | ICD-10-CM

## 2012-08-28 NOTE — Telephone Encounter (Signed)
Orders placed for the below 

## 2012-08-28 NOTE — Patient Instructions (Addendum)
Office will contact you to arrange mammogram and ultrasound. 

## 2012-08-28 NOTE — Telephone Encounter (Signed)
Message copied by Aura Camps on Fri Aug 28, 2012  3:26 PM ------      Message from: Keenan Bachelor      Created: Fri Aug 28, 2012  2:51 PM                   ----- Message -----         From: Dara Lords, MD         Sent: 08/28/2012   2:42 PM           To: Theodoro Doing, CMA            Schedule diagnostic mammogram and ultrasound at breast center reference right tail of Spence mass. Attempted aspiration negative.

## 2012-08-28 NOTE — Progress Notes (Signed)
Patient ID: Mackenzie Meyers, female   DOB: 1971/08/03, 42 y.o.   MRN: [id number] Patient presents with a one-month history of right breast pain. It is in the lateral aspect of her breast where she's had some scar tissue from her augmentation surgery but notes that it seems to be more palpable and now is tender. No history of this before.  It does seem to come and go. No nipple discharge. She is not menstruating due to her Mirena IUD  Exam with Berenice Bouton Both breasts examined lying and sitting. Left without masses retractions discharge adenopathy. Right with 2-3 cm firm nodular mass tail of Spence mobile no overlying skin changes.  No nipple discharge axillary adenopathy.  Overlying skin was cleansed with alcohol, infiltrated with 1% lidocaine and attempted aspiration times several passes unsuccessful. The area felt solid on passing of the needle.  Physical Exam  Pulmonary/Chest:      Assessment and plan: Firm nodular density right tail of Spence feels solid on attempted needle aspiration. We'll start with diagnostic mammography and ultrasound. Various scenarios and possibilities reviewed to include carcinoma.  Patient will follow up for studies and then we will triage based on results. She is coming due for her annual in February and knows to schedule this.

## 2012-09-01 NOTE — Telephone Encounter (Signed)
appointment on 09/04/12 @ 8:30 am.

## 2012-09-04 ENCOUNTER — Ambulatory Visit
Admission: RE | Admit: 2012-09-04 | Discharge: 2012-09-04 | Disposition: A | Discharge status: Home / Self Care | Payer: No Typology Code available for payment source | Source: Ambulatory Visit | Attending: Gynecology | Admitting: Gynecology

## 2012-09-04 DIAGNOSIS — N6310 Unspecified lump in the right breast, unspecified quadrant: Secondary | ICD-10-CM

## 2012-09-07 ENCOUNTER — Ambulatory Visit (INDEPENDENT_AMBULATORY_CARE_PROVIDER_SITE_OTHER): Payer: No Typology Code available for payment source | Admitting: Gynecology

## 2012-09-07 ENCOUNTER — Encounter: Payer: Self-pay | Admitting: Gynecology

## 2012-09-07 ENCOUNTER — Telehealth: Payer: Self-pay

## 2012-09-07 DIAGNOSIS — N61 Mastitis without abscess: Secondary | ICD-10-CM

## 2012-09-07 DIAGNOSIS — N63 Unspecified lump in unspecified breast: Secondary | ICD-10-CM

## 2012-09-07 MED ORDER — DOXYCYCLINE HYCLATE 100 MG PO CAPS: 100.0000 mg | ORAL_CAPSULE | Freq: Two times a day (BID) | ORAL | Status: DC

## 2012-09-07 MED ORDER — FLUCONAZOLE 150 MG PO TABS: 150.0000 mg | ORAL_TABLET | Freq: Once | ORAL | Status: DC

## 2012-09-07 NOTE — Patient Instructions (Signed)
Follow up with general surgeon as arranged

## 2012-09-07 NOTE — Telephone Encounter (Signed)
Message copied by Keenan Bachelor on Mon Sep 07, 2012  3:35 PM ------      Message from: Dara Lords      Created: Mon Sep 07, 2012  3:03 PM       Schedule an appointment with Gen. Surgeon reference right breast mass. Ultrasound mammogram suggestive of fat necrosis/scarring. Mass is symptomatic to the patient and now has evidence of mastitis. Currently on antibiotics.

## 2012-09-07 NOTE — Telephone Encounter (Signed)
Patient called. Informed of appointment time/date.

## 2012-09-07 NOTE — Telephone Encounter (Signed)
Appointment scheduled with Dr. Jamey Ripa for Friday Jan 31 11:30am.  Left message for patient to call home recorder.

## 2012-09-07 NOTE — Progress Notes (Signed)
Patient ID: Mackenzie Meyers, female   DOB: [birth year], 42 y.o.   MRN: [id number] Patient presents having recently been evaluated for right breast lump 08/28/2012. Attempted aspiration negative. She had follow up mammogram/ultrasound 3 days ago which was consistent with scarring and fat necrosis. Patient notes over the last Ruby or 2 this area seems to be more tender, larger and more red and the skin overlying. No fever chills or axillary tenderness.  Exam with Selena Batten assistant Right breast examined lying and sitting. Firm nodular for a 5 cm mass right tail of Spence several fingerbreadth off the areola. Overlying skin is faintly red. No nipple discharge or axillary adenopathy. Physical Exam  Pulmonary/Chest:       Assessment and plan: Right breast mass on ultrasound/mammogram suggestive of scarring and fat necrosis. Is symptomatic to the patient as well as now demonstrates changes of possible mastitis. A little late from the attempted aspiration. Circumstantially related to the mammogram ultrasound procedures. We'll cover with Vibramycin 100 mg twice a Ta x7 days and have general surgery evaluate for excision. I also prescribed Diflucan 150 mg she is prone to yeast infections with antibiotics.

## 2012-09-25 ENCOUNTER — Encounter (INDEPENDENT_AMBULATORY_CARE_PROVIDER_SITE_OTHER): Payer: Self-pay | Admitting: Surgery

## 2012-09-25 ENCOUNTER — Ambulatory Visit (INDEPENDENT_AMBULATORY_CARE_PROVIDER_SITE_OTHER): Payer: PRIVATE HEALTH INSURANCE | Admitting: Surgery

## 2012-09-25 VITALS — BP 110/78 | HR 76 | Resp 16 | Ht 64.0 in | Wt 183.0 lb

## 2012-09-25 DIAGNOSIS — N6310 Unspecified lump in the right breast, unspecified quadrant: Secondary | ICD-10-CM

## 2012-09-25 DIAGNOSIS — N63 Unspecified lump in unspecified breast: Secondary | ICD-10-CM

## 2012-09-25 NOTE — Progress Notes (Signed)
Mackenzie Meyers DOB: 04-23-1971 MRN: [id number]                                                                                      DATE: 09/25/2012  PCP: Mackenzie Acre, MD Referring Provider: Dara Lords, MD  IMPRESSION:  Right breast mass, lower outer quadrant, likely fat necrosis  PLAN:   I discussed this with the patient. This is symptomatic and I have recommended that we go ahead with excision. I don't think an additional biopsy is necessary as it looks benign on imaging studies it has been present for quite a while, just more symptomatic recently. I discussed risks and complications, and answered questions. We will get this set up to be done at her convenience.                 CC:  Chief Complaint  Patient presents with  . Breast Mass    right    HPI:  Mackenzie Meyers is a 42 y.o.  female who presents for evaluation of right breast mass. She's had a prior "lift "bilaterally done about 5 years ago. There is apparently an area in the lower outer quadrant of the right that was firm and thought to be scar tissue. However about a month to 6 weeks ago it became more prominent and more symptomatic. A needle aspiration was done but no fluid was obtained. It got much more tender and swollen after that. Mammogram and ultrasound were done and is consistent with benign fat necrosis and inflammatory changes. Because of her symptoms surgical consultation was requested by her gynecologist.  PMH:  has a past medical history of Heart murmur and Arthritis.  PSH:   has past surgical history that includes Cesarean section (2004 and 2007); Tonsillectomy (2000); Abdominoplasty (2008); Intrauterine device insertion (09/13/2009); cryosurgery cervix (age 7); and Augmentation mammaplasty (2008).  ALLERGIES:   Allergies  Allergen Reactions  . Ampicillin Hives  . Erythromycin Nausea And Vomiting    MEDICATIONS: Current outpatient prescriptions:levonorgestrel (MIRENA) 20 MCG/24HR IUD, 1 each  by Intrauterine route once. Inserted 09/13/09 , Disp: , Rfl:   ROS: She has filled out our 12 point review of systems and it is negative except for some easy bruisability.Marland Kitchen EXAM:   VITAL SIGNS: BP 110/78  Pulse 76  Resp 16  Ht 5\' 4"  (1.626 m)  Wt 183 lb (83.008 kg)  BMI 31.41 kg/m2 GENERAL:  The patient is alert, oriented, and generally healthy-appearing, NAD. Mood and affect are normal.  HEENT:  The head is normocephalic, the eyes nonicteric, the pupils were round regular and equal. EOMs are normal. Pharynx normal. Dentition good.  NECK:  The neck is supple and there are no masses or thyromegaly.  LUNGS: Normal respirations and clear to auscultation.  HEART: Regular rhythm, with no murmurs rubs or gallops. Pulses are intact carotid dorsalis pedis and posterior tibial. No significant varicosities are noted.  BREASTS:  the breasts are status post "breast lift". They're slightly irregular. In the right breast there is an oblong area that parallels the inframammary incision. This is in the lateral aspect. It is about 3 cm above the incision. It is about  5 cm long and about 2 cm wide. It is somewhat hard and I think consistent with fat necrosis. It is a little bit tender. It is mobile.  ABDOMEN: Soft, flat, and nontender. No masses or organomegaly is noted. No hernias are noted. Bowel sounds are normal.  EXTREMITIES:  Good range of motion, no edema.   DATA REVIEWED:  I have reviewed the office notes there are and apical as well as the mammogram and ultrasound    Mackenzie Meyers 09/25/2012  CC: Mackenzie Meyers, Mackenzie Coombes, MD, Mackenzie Acre, MD

## 2012-09-25 NOTE — Patient Instructions (Addendum)
We will schedule surgery to removed the mass (lump) in your right breast

## 2012-09-26 DIAGNOSIS — N6310 Unspecified lump in the right breast, unspecified quadrant: Secondary | ICD-10-CM

## 2012-09-26 HISTORY — DX: Unspecified lump in the right breast, unspecified quadrant: N63.10

## 2012-10-12 ENCOUNTER — Encounter (HOSPITAL_BASED_OUTPATIENT_CLINIC_OR_DEPARTMENT_OTHER): Payer: Self-pay | Admitting: *Deleted

## 2012-10-12 DIAGNOSIS — R0981 Nasal congestion: Secondary | ICD-10-CM

## 2012-10-12 HISTORY — DX: Nasal congestion: R09.81

## 2012-10-15 ENCOUNTER — Ambulatory Visit (HOSPITAL_BASED_OUTPATIENT_CLINIC_OR_DEPARTMENT_OTHER): Payer: PRIVATE HEALTH INSURANCE | Admitting: Anesthesiology

## 2012-10-15 ENCOUNTER — Encounter (HOSPITAL_BASED_OUTPATIENT_CLINIC_OR_DEPARTMENT_OTHER)
Admission: RE | Disposition: A | Discharge status: Home / Self Care | Payer: Self-pay | Source: Ambulatory Visit | Attending: Surgery

## 2012-10-15 ENCOUNTER — Encounter (HOSPITAL_BASED_OUTPATIENT_CLINIC_OR_DEPARTMENT_OTHER): Payer: Self-pay

## 2012-10-15 ENCOUNTER — Ambulatory Visit (HOSPITAL_BASED_OUTPATIENT_CLINIC_OR_DEPARTMENT_OTHER)
Admission: RE | Admit: 2012-10-15 | Discharge: 2012-10-15 | Disposition: A | Discharge status: Home / Self Care | Payer: PRIVATE HEALTH INSURANCE | Source: Ambulatory Visit | Attending: Surgery | Admitting: Surgery

## 2012-10-15 ENCOUNTER — Encounter (HOSPITAL_BASED_OUTPATIENT_CLINIC_OR_DEPARTMENT_OTHER): Payer: Self-pay | Admitting: Anesthesiology

## 2012-10-15 DIAGNOSIS — N641 Fat necrosis of breast: Secondary | ICD-10-CM

## 2012-10-15 DIAGNOSIS — Z79899 Other long term (current) drug therapy: Secondary | ICD-10-CM | POA: Insufficient documentation

## 2012-10-15 DIAGNOSIS — N6310 Unspecified lump in the right breast, unspecified quadrant: Secondary | ICD-10-CM

## 2012-10-15 DIAGNOSIS — R920 Mammographic microcalcification found on diagnostic imaging of breast: Secondary | ICD-10-CM

## 2012-10-15 HISTORY — PX: BREAST BIOPSY: SHX20

## 2012-10-15 HISTORY — DX: Nasal congestion: R09.81

## 2012-10-15 HISTORY — DX: Unspecified lump in the right breast, unspecified quadrant: N63.10

## 2012-10-15 HISTORY — DX: Personal history of urinary calculi: Z87.442

## 2012-10-15 SURGERY — BREAST BIOPSY
Anesthesia: General | Site: Breast | Laterality: Right | Wound class: Clean

## 2012-10-15 MED ORDER — LIDOCAINE HCL (CARDIAC) 20 MG/ML IV SOLN
INTRAVENOUS | Status: DC | PRN
  Administered 2012-10-15: 60 mg via INTRAVENOUS

## 2012-10-15 MED ORDER — OXYCODONE HCL 5 MG PO TABS: 5.0000 mg | ORAL_TABLET | Freq: Once | ORAL | Status: DC | PRN

## 2012-10-15 MED ORDER — CHLORHEXIDINE GLUCONATE 4 % EX LIQD: 1.0000 "application " | Freq: Once | CUTANEOUS | Status: DC

## 2012-10-15 MED ORDER — HYDROCODONE-ACETAMINOPHEN 5-325 MG PO TABS
1.0000 | ORAL_TABLET | ORAL | Status: DC | PRN
Start: 2012-10-15 — End: 2012-11-05

## 2012-10-15 MED ORDER — LACTATED RINGERS IV SOLN
INTRAVENOUS | Status: DC
  Administered 2012-10-15: 10 mL/h via INTRAVENOUS
  Administered 2012-10-15: 13:00:00 via INTRAVENOUS

## 2012-10-15 MED ORDER — OXYCODONE HCL 5 MG/5ML PO SOLN: 5.0000 mg | Freq: Once | ORAL | Status: DC | PRN

## 2012-10-15 MED ORDER — FENTANYL CITRATE 0.05 MG/ML IJ SOLN: 50.0000 ug | INTRAMUSCULAR | Status: DC | PRN

## 2012-10-15 MED ORDER — MIDAZOLAM HCL 2 MG/2ML IJ SOLN: 1.0000 mg | INTRAMUSCULAR | Status: DC | PRN

## 2012-10-15 MED ORDER — PROPOFOL 10 MG/ML IV BOLUS
INTRAVENOUS | Status: DC | PRN
  Administered 2012-10-15: 150 mg via INTRAVENOUS

## 2012-10-15 MED ORDER — DEXAMETHASONE SODIUM PHOSPHATE 4 MG/ML IJ SOLN
INTRAMUSCULAR | Status: DC | PRN
  Administered 2012-10-15: 10 mg via INTRAVENOUS

## 2012-10-15 MED ORDER — ONDANSETRON HCL 4 MG/2ML IJ SOLN
INTRAMUSCULAR | Status: DC | PRN
  Administered 2012-10-15: 4 mg via INTRAVENOUS

## 2012-10-15 MED ORDER — MIDAZOLAM HCL 5 MG/5ML IJ SOLN
INTRAMUSCULAR | Status: DC | PRN
  Administered 2012-10-15: 2 mg via INTRAVENOUS

## 2012-10-15 MED ORDER — HYDROMORPHONE HCL PF 1 MG/ML IJ SOLN
0.2500 mg | INTRAMUSCULAR | Status: DC | PRN
  Administered 2012-10-15 (×3): 0.5 mg via INTRAVENOUS

## 2012-10-15 MED ORDER — FENTANYL CITRATE 0.05 MG/ML IJ SOLN
INTRAMUSCULAR | Status: DC | PRN
  Administered 2012-10-15: 100 ug via INTRAVENOUS
  Administered 2012-10-15: 50 ug via INTRAVENOUS

## 2012-10-15 MED ORDER — CIPROFLOXACIN IN D5W 400 MG/200ML IV SOLN
400.0000 mg | INTRAVENOUS | Status: AC
  Administered 2012-10-15: 400 mg via INTRAVENOUS

## 2012-10-15 MED ORDER — BUPIVACAINE HCL (PF) 0.25 % IJ SOLN
INTRAMUSCULAR | Status: DC | PRN
  Administered 2012-10-15: 30 mL

## 2012-10-15 SURGICAL SUPPLY — 51 items
ADH SKN CLS APL DERMABOND .7 (GAUZE/BANDAGES/DRESSINGS) ×1
APPLICATOR COTTON TIP 6IN STRL (MISCELLANEOUS) IMPLANT
BINDER BREAST LRG (GAUZE/BANDAGES/DRESSINGS) IMPLANT
BINDER BREAST MEDIUM (GAUZE/BANDAGES/DRESSINGS) IMPLANT
BINDER BREAST XLRG (GAUZE/BANDAGES/DRESSINGS) IMPLANT
BINDER BREAST XXLRG (GAUZE/BANDAGES/DRESSINGS) IMPLANT
BLADE HEX COATED 2.75 (ELECTRODE) ×2 IMPLANT
BLADE SURG 15 STRL LF DISP TIS (BLADE) ×1 IMPLANT
BLADE SURG 15 STRL SS (BLADE) ×2
CANISTER SUCTION 1200CC (MISCELLANEOUS) ×2 IMPLANT
CHLORAPREP W/TINT 26ML (MISCELLANEOUS) ×2 IMPLANT
CLIP TI MEDIUM 6 (CLIP) IMPLANT
CLIP TI WIDE RED SMALL 6 (CLIP) IMPLANT
CLOTH BEACON ORANGE TIMEOUT ST (SAFETY) ×2 IMPLANT
COVER MAYO STAND STRL (DRAPES) ×2 IMPLANT
COVER TABLE BACK 60X90 (DRAPES) ×2 IMPLANT
DECANTER SPIKE VIAL GLASS SM (MISCELLANEOUS) IMPLANT
DERMABOND ADVANCED (GAUZE/BANDAGES/DRESSINGS) ×1
DERMABOND ADVANCED .7 DNX12 (GAUZE/BANDAGES/DRESSINGS) ×1 IMPLANT
DEVICE DUBIN W/COMP PLATE 8390 (MISCELLANEOUS) IMPLANT
DRAPE LAPAROTOMY TRNSV 102X78 (DRAPE) ×2 IMPLANT
DRAPE SURG 17X23 STRL (DRAPES) IMPLANT
DRAPE UTILITY XL STRL (DRAPES) ×2 IMPLANT
ELECT REM PT RETURN 9FT ADLT (ELECTROSURGICAL) ×2
ELECTRODE REM PT RTRN 9FT ADLT (ELECTROSURGICAL) ×1 IMPLANT
GLOVE BIO SURGEON STRL SZ 6.5 (GLOVE) ×1 IMPLANT
GLOVE BIO SURGEON STRL SZ7 (GLOVE) ×1 IMPLANT
GLOVE BIOGEL PI IND STRL 7.0 (GLOVE) IMPLANT
GLOVE BIOGEL PI INDICATOR 7.0 (GLOVE) ×1
GLOVE EUDERMIC 7 POWDERFREE (GLOVE) ×2 IMPLANT
GOWN PREVENTION PLUS XLARGE (GOWN DISPOSABLE) ×5 IMPLANT
KIT MARKER MARGIN INK (KITS) IMPLANT
NDL HYPO 25X1 1.5 SAFETY (NEEDLE) ×1 IMPLANT
NEEDLE HYPO 25X1 1.5 SAFETY (NEEDLE) ×2 IMPLANT
NS IRRIG 1000ML POUR BTL (IV SOLUTION) ×1 IMPLANT
PACK BASIN DAY SURGERY FS (CUSTOM PROCEDURE TRAY) ×2 IMPLANT
PENCIL BUTTON HOLSTER BLD 10FT (ELECTRODE) ×2 IMPLANT
SHEET MEDIUM DRAPE 40X70 STRL (DRAPES) ×2 IMPLANT
SLEEVE SCD COMPRESS KNEE MED (MISCELLANEOUS) ×2 IMPLANT
SPONGE GAUZE 4X4 12PLY (GAUZE/BANDAGES/DRESSINGS) IMPLANT
SPONGE INTESTINAL PEANUT (DISPOSABLE) IMPLANT
SPONGE LAP 4X18 X RAY DECT (DISPOSABLE) ×2 IMPLANT
STAPLER VISISTAT 35W (STAPLE) IMPLANT
SUT MNCRL AB 4-0 PS2 18 (SUTURE) ×2 IMPLANT
SUT SILK 0 TIES 10X30 (SUTURE) IMPLANT
SUT VICRYL 3-0 CR8 SH (SUTURE) ×2 IMPLANT
SYR CONTROL 10ML LL (SYRINGE) ×2 IMPLANT
TOWEL OR NON WOVEN STRL DISP B (DISPOSABLE) ×2 IMPLANT
TUBE CONNECTING 20X1/4 (TUBING) ×2 IMPLANT
WATER STERILE IRR 1000ML POUR (IV SOLUTION) ×2 IMPLANT
YANKAUER SUCT BULB TIP NO VENT (SUCTIONS) ×2 IMPLANT

## 2012-10-15 NOTE — Interval H&P Note (Signed)
History and Physical Interval Note:  10/15/2012 2:27 PM  Mackenzie Meyers  has presented today for surgery, with the diagnosis of right breast mass  The various methods of treatment have been discussed with the patient and family. After consideration of risks, benefits and other options for treatment, the patient has consented to  Procedure(s) with comments: BREAST BIOPSY (Right) - removal right breast mass as a surgical intervention .  The patient's history has been reviewed, patient examined, no change in status, stable for surgery.  I have reviewed the patient's chart and labs.  Questions were answered to the patient's satisfaction.   The right breast is marked as the operative side  [name] J

## 2012-10-15 NOTE — H&P (View-Only) (Signed)
NAME: Mackenzie Meyers DOB: 07/05/1971 MRN: [id number]                                                                                      DATE: 09/25/2012  PCP: NNODI, ADAKU, MD Referring Provider: Fontaine, Timothy P, MD  IMPRESSION:  Right breast mass, lower outer quadrant, likely fat necrosis  PLAN:   I discussed this with the patient. This is symptomatic and I have recommended that we go ahead with excision. I don't think an additional biopsy is necessary as it looks benign on imaging studies it has been present for quite a while, just more symptomatic recently. I discussed risks and complications, and answered questions. We will get this set up to be done at her convenience.                 CC:  Chief Complaint  Patient presents with  . Breast Mass    right    HPI:  Mackenzie Meyers is a 41 y.o.  female who presents for evaluation of right breast mass. She's had a prior "lift "bilaterally done about 5 years ago. There is apparently an area in the lower outer quadrant of the right that was firm and thought to be scar tissue. However about a month to 6 weeks ago it became more prominent and more symptomatic. A needle aspiration was done but no fluid was obtained. It got much more tender and swollen after that. Mammogram and ultrasound were done and is consistent with benign fat necrosis and inflammatory changes. Because of her symptoms surgical consultation was requested by her gynecologist.  PMH:  has a past medical history of Heart murmur and Arthritis.  PSH:   has past surgical history that includes Cesarean section (2004 and 2007); Tonsillectomy (2000); Abdominoplasty (2008); Intrauterine device insertion (09/13/2009); cryosurgery cervix (age 21); and Augmentation mammaplasty (2008).  ALLERGIES:   Allergies  Allergen Reactions  . Ampicillin Hives  . Erythromycin Nausea And Vomiting    MEDICATIONS: Current outpatient prescriptions:levonorgestrel (MIRENA) 20 MCG/24HR IUD, 1 each  by Intrauterine route once. Inserted 09/13/09 , Disp: , Rfl:   ROS: She has filled out our 12 point review of systems and it is negative except for some easy bruisability.. EXAM:   VITAL SIGNS: BP 110/78  Pulse 76  Resp 16  Ht 5' 4" (1.626 m)  Wt 183 lb (83.008 kg)  BMI 31.41 kg/m2 GENERAL:  The patient is alert, oriented, and generally healthy-appearing, NAD. Mood and affect are normal.  HEENT:  The head is normocephalic, the eyes nonicteric, the pupils were round regular and equal. EOMs are normal. Pharynx normal. Dentition good.  NECK:  The neck is supple and there are no masses or thyromegaly.  LUNGS: Normal respirations and clear to auscultation.  HEART: Regular rhythm, with no murmurs rubs or gallops. Pulses are intact carotid dorsalis pedis and posterior tibial. No significant varicosities are noted.  BREASTS:  the breasts are status post "breast lift". They're slightly irregular. In the right breast there is an oblong area that parallels the inframammary incision. This is in the lateral aspect. It is about 3 cm above the incision. It is about   5 cm long and about 2 cm wide. It is somewhat hard and I think consistent with fat necrosis. It is a little bit tender. It is mobile.  ABDOMEN: Soft, flat, and nontender. No masses or organomegaly is noted. No hernias are noted. Bowel sounds are normal.  EXTREMITIES:  Good range of motion, no edema.   DATA REVIEWED:  I have reviewed the office notes there are and apical as well as the mammogram and ultrasound    Karalynn Cottone J 09/25/2012  CC: Fontaine, Timothy P, MD, NNODI, ADAKU, MD        

## 2012-10-15 NOTE — Anesthesia Postprocedure Evaluation (Signed)
  Anesthesia Post-op Note  Patient: Mackenzie Meyers  Procedure(s) Performed: Procedure(s) with comments: BREAST BIOPSY (Right) - removal right breast mass  Patient Location: PACU  Anesthesia Type:General  Level of Consciousness: awake, alert  and oriented  Airway and Oxygen Therapy: Patient Spontanous Breathing  Post-op Pain: mild  Post-op Assessment: Post-op Vital signs reviewed, Patient's Cardiovascular Status Stable, Respiratory Function Stable, Patent Airway and No signs of Nausea or vomiting  Post-op Vital Signs: Reviewed and stable  Complications: No apparent anesthesia complications

## 2012-10-15 NOTE — Op Note (Signed)
KAYRON KALMAR  1971/06/27  [id number]  10/15/2012   Preoperative diagnosis: Right breast mass, probable fat necrosis  Postoperative diagnosis: Same  Procedure: Removal of right breast mass  Surgeon: Currie Paris, MD, FACS  Assistant: Ralene Muskrat, PA-S  Anesthesia: General  Clinical History and Indications: this patient presents for a guidewire localized excision of a palpable right  breast mass in the lower outer quadrant thought to be fat necrosis. She is s/p reduction many years ago.  Description of procedure: The patient was seen in the holding area and the plans for the procedure reviewed. The right breast was marked as the operative side.   The patient was taken to the operating room and the mass identified and marked prior to anesthesia.after satisfactory general anesthesia had been obtained the right breast was prepped and draped and the timeout was performed.  The incision was made over themass. Skin flaps were raised and using cautery the area was completely excised. Bleeders were controlled with either cautery or sutures as needed.  After achieving hemostasis, the incision was closed with 3-0 Vicryl, 4-0 Monocryl subcuticular, and Dermabond. 30 cc of 0.25% plain marcaine was used to help with post op pain management  The patient tolerated the procedure well. There were no operative complications. All counts were correct.   EBL: Minimal  Currie Paris, MD, FACS 10/15/2012 3:25 PM

## 2012-10-15 NOTE — Anesthesia Preprocedure Evaluation (Signed)
Anesthesia Evaluation  Patient identified by MRN, date of birth, ID band Patient awake    Reviewed: Allergy & Precautions, H&P , NPO status , Patient's Chart, lab work & pertinent test results  History of Anesthesia Complications (+) PONV  Airway Mallampati: I TM Distance: >3 FB Neck ROM: Full    Dental no notable dental hx. (+) Teeth Intact and Dental Advisory Given   Pulmonary neg pulmonary ROS,  breath sounds clear to auscultation  Pulmonary exam normal       Cardiovascular negative cardio ROS  Rhythm:Regular Rate:Normal     Neuro/Psych negative neurological ROS  negative psych ROS   GI/Hepatic negative GI ROS, Neg liver ROS,   Endo/Other  negative endocrine ROS  Renal/GU negative Renal ROS  negative genitourinary   Musculoskeletal   Abdominal   Peds  Hematology negative hematology ROS (+)   Anesthesia Other Findings   Reproductive/Obstetrics negative OB ROS                           Anesthesia Physical Anesthesia Plan  ASA: II  Anesthesia Plan: General   Post-op Pain Management:    Induction: Intravenous  Airway Management Planned: LMA  Additional Equipment:   Intra-op Plan:   Post-operative Plan: Extubation in OR  Informed Consent: I have reviewed the patients History and Physical, chart, labs and discussed the procedure including the risks, benefits and alternatives for the proposed anesthesia with the patient or authorized representative who has indicated his/her understanding and acceptance.   Dental advisory given  Plan Discussed with: CRNA  Anesthesia Plan Comments:         Anesthesia Quick Evaluation

## 2012-10-15 NOTE — Transfer of Care (Signed)
Immediate Anesthesia Transfer of Care Note  Patient: Mackenzie Meyers  Procedure(s) Performed: Procedure(s) with comments: BREAST BIOPSY (Right) - removal right breast mass  Patient Location: PACU  Anesthesia Type:General  Level of Consciousness: awake, alert  and oriented  Airway & Oxygen Therapy: Patient Spontanous Breathing and Patient connected to face mask oxygen  Post-op Assessment: Report given to PACU RN, Post -op Vital signs reviewed and stable and Patient moving all extremities  Post vital signs: Reviewed and stable  Complications: No apparent anesthesia complications

## 2012-10-15 NOTE — Anesthesia Procedure Notes (Signed)
Procedure Name: LMA Insertion Date/Time: 10/15/2012 2:42 PM Performed by: Meyer Russel Pre-anesthesia Checklist: Patient identified, Emergency Drugs available, Suction available and Patient being monitored Patient Re-evaluated:Patient Re-evaluated prior to inductionOxygen Delivery Method: Circle System Utilized Preoxygenation: Pre-oxygenation with 100% oxygen Intubation Type: IV induction Ventilation: Mask ventilation without difficulty LMA: LMA inserted LMA Size: 4.0 Number of attempts: 1 Airway Equipment and Method: bite block Placement Confirmation: positive ETCO2 and breath sounds checked- equal and bilateral Tube secured with: Tape Dental Injury: Teeth and Oropharynx as per pre-operative assessment

## 2012-10-16 ENCOUNTER — Encounter (HOSPITAL_BASED_OUTPATIENT_CLINIC_OR_DEPARTMENT_OTHER): Payer: Self-pay | Admitting: Surgery

## 2012-10-20 ENCOUNTER — Telehealth (INDEPENDENT_AMBULATORY_CARE_PROVIDER_SITE_OTHER): Payer: Self-pay | Admitting: General Surgery

## 2012-10-20 NOTE — Telephone Encounter (Signed)
Patient made aware of path results. Will follow up at appt and call with any questions prior.  

## 2012-10-20 NOTE — Telephone Encounter (Signed)
Message copied by Liliana Cline on Tue Oct 20, 2012  9:03 AM ------      Message from: Currie Paris      Created: Tue Oct 20, 2012  6:05 AM       Tell her path is benign and as expected - all fat necrosis as expected ------

## 2012-10-27 ENCOUNTER — Encounter: Payer: Self-pay | Admitting: Gynecology

## 2012-10-27 ENCOUNTER — Ambulatory Visit (INDEPENDENT_AMBULATORY_CARE_PROVIDER_SITE_OTHER): Payer: PRIVATE HEALTH INSURANCE | Admitting: Gynecology

## 2012-10-27 VITALS — BP 112/70 | Ht 65.0 in | Wt 180.0 lb

## 2012-10-27 DIAGNOSIS — Z13220 Encounter for screening for lipoid disorders: Secondary | ICD-10-CM

## 2012-10-27 DIAGNOSIS — E559 Vitamin D deficiency, unspecified: Secondary | ICD-10-CM

## 2012-10-27 DIAGNOSIS — Z01419 Encounter for gynecological examination (general) (routine) without abnormal findings: Secondary | ICD-10-CM

## 2012-10-27 DIAGNOSIS — Z30431 Encounter for routine checking of intrauterine contraceptive device: Secondary | ICD-10-CM

## 2012-10-27 DIAGNOSIS — Z113 Encounter for screening for infections with a predominantly sexual mode of transmission: Secondary | ICD-10-CM

## 2012-10-27 LAB — COMPREHENSIVE METABOLIC PANEL
ALT: 15 U/L (ref 0–35)
AST: 16 U/L (ref 0–37)
Albumin: 4.3 g/dL (ref 3.5–5.2)
Alkaline Phosphatase: 58 U/L (ref 39–117)
BUN: 9 mg/dL (ref 6–23)
CO2: 29 mEq/L (ref 19–32)
Calcium: 9.5 mg/dL (ref 8.4–10.5)
Chloride: 104 mEq/L (ref 96–112)
Creat: 0.68 mg/dL (ref 0.50–1.10)
Glucose, Bld: 84 mg/dL (ref 70–99)
Potassium: 4.1 mEq/L (ref 3.5–5.3)
Sodium: 139 mEq/L (ref 135–145)
Total Bilirubin: 0.5 mg/dL (ref 0.3–1.2)
Total Protein: 6.7 g/dL (ref 6.0–8.3)

## 2012-10-27 LAB — LIPID PANEL
Cholesterol: 186 mg/dL (ref 0–200)
HDL: 55 mg/dL (ref >39)
LDL Cholesterol: 116 mg/dL — ABNORMAL HIGH (ref 0–99)
Total CHOL/HDL Ratio: 3.4 Ratio
Triglycerides: 75 mg/dL (ref <150)
VLDL: 15 mg/dL (ref 0–40)

## 2012-10-27 LAB — CBC WITH DIFFERENTIAL/PLATELET
Basophils Absolute: 0 10*3/uL (ref 0.0–0.1)
Basophils Relative: 1 % (ref 0–1)
Eosinophils Absolute: 0.1 10*3/uL (ref 0.0–0.7)
Eosinophils Relative: 1 % (ref 0–5)
HCT: 41.5 % (ref 36.0–46.0)
Hemoglobin: 14.2 g/dL (ref 12.0–15.0)
Lymphocytes Relative: 25 % (ref 12–46)
Lymphs Abs: 2.1 10*3/uL (ref 0.7–4.0)
MCH: 28.9 pg (ref 26.0–34.0)
MCHC: 34.2 g/dL (ref 30.0–36.0)
MCV: 84.5 fL (ref 78.0–100.0)
Monocytes Absolute: 0.7 10*3/uL (ref 0.1–1.0)
Monocytes Relative: 8 % (ref 3–12)
Neutro Abs: 5.4 10*3/uL (ref 1.7–7.7)
Neutrophils Relative %: 65 % (ref 43–77)
Platelets: 279 10*3/uL (ref 150–400)
RBC: 4.91 MIL/uL (ref 3.87–5.11)
RDW: 13.1 % (ref 11.5–15.5)
WBC: 8.3 10*3/uL (ref 4.0–10.5)

## 2012-10-27 LAB — RPR

## 2012-10-27 NOTE — Progress Notes (Signed)
Mackenzie Meyers April 30, 1971 [id number]        42 y.o.  [id number] for annual exam.  Several issues noted below.  Past medical history,surgical history, medications, allergies, family history and social history were all reviewed and documented in the EPIC chart. ROS:  Was performed and pertinent positives and negatives are included in the history.  Exam: Mackenzie Meyers Filed Vitals:   10/27/12 1153  BP: 112/70  Height: 5\' 5"  (1.651 m)  Weight: 180 lb (81.647 kg)   General appearance  Normal Skin grossly normal Head/Neck normal with no cervical or supraclavicular adenopathy thyroid normal Lungs  clear Cardiac RR, without RMG Abdominal  soft, nontender, without masses, organomegaly or hernia Breasts  without masses, retractions, discharge or axillary adenopathy.  Well-healed bilateral reduction scars. Fresh intact incision line right breast. Pelvic  Ext/BUS/vagina  normal   Cervix  normal GC chlamydia screen  Uterus  anteverted, normal size, shape and contour, midline and mobile nontender   Adnexa  Without masses or tenderness    Anus and perineum  normal   Rectovaginal  normal sphincter tone without palpated masses or tenderness.    Assessment/Plan:  Mackenzie y.o. Mackenzie Meyers female for annual exam.   1. Mirena IUD 08/2009. Amenorrheic doing well. IUD string not visualized as in the past. Patient has previous  ultrasound documenting intrauterine placement and is comfortable with continued monitoring. 2. STD screening. Patient requests STD screening. No known exposure but wants screened. HIV RPR hepatitis B hepatitis C GC and Chlamydia screen done. 3. Pap smear 2013. No Pap done today. History of cryosurgery at age 48 with normal Pap smears since then. Plan repeat at 3 year interval. 4. Mammography January 2014. Recent excision of benign breast mass in her right breast. Has follow up appointment with the general surgeons next week. 5. History of vitamin D deficiency in the past. Check vitamin D  level today. 6. Health maintenance.  CBC comprehensive metabolic panel lipid profile urinalysis ordered along with her STD and vitamin D bloodwork.    Dara Lords MD, 12:21 PM 10/27/2012

## 2012-10-27 NOTE — Patient Instructions (Signed)
Followup for lab results on My Chart Followup in one year for annual exam.

## 2012-10-28 LAB — URINALYSIS W MICROSCOPIC + REFLEX CULTURE
Bacteria, UA: NONE SEEN
Bilirubin Urine: NEGATIVE
Casts: NONE SEEN
Crystals: NONE SEEN
Glucose, UA: NEGATIVE mg/dL
Hgb urine dipstick: NEGATIVE
Ketones, ur: NEGATIVE mg/dL
Leukocytes, UA: NEGATIVE
Nitrite: NEGATIVE
Protein, ur: NEGATIVE mg/dL
Specific Gravity, Urine: 1.01 (ref 1.005–1.030)
Squamous Epithelial / LPF: NONE SEEN
Urobilinogen, UA: 0.2 mg/dL (ref 0.0–1.0)
pH: 6.5 (ref 5.0–8.0)

## 2012-10-28 LAB — VITAMIN D 25 HYDROXY (VIT D DEFICIENCY, FRACTURES): Vit D, 25-Hydroxy: 31 ng/mL (ref 30–89)

## 2012-10-28 LAB — HEPATITIS B SURFACE ANTIGEN: Hepatitis B Surface Ag: NEGATIVE

## 2012-10-28 LAB — GC/CHLAMYDIA PROBE AMP
CT Probe RNA: NEGATIVE
GC Probe RNA: NEGATIVE

## 2012-10-28 LAB — HEPATITIS C ANTIBODY: HCV Ab: NEGATIVE

## 2012-10-28 LAB — HIV ANTIBODY (ROUTINE TESTING W REFLEX): HIV: NONREACTIVE

## 2012-10-30 ENCOUNTER — Encounter (INDEPENDENT_AMBULATORY_CARE_PROVIDER_SITE_OTHER): Payer: PRIVATE HEALTH INSURANCE | Admitting: Surgery

## 2012-11-05 ENCOUNTER — Encounter (INDEPENDENT_AMBULATORY_CARE_PROVIDER_SITE_OTHER): Payer: Self-pay | Admitting: Surgery

## 2012-11-05 ENCOUNTER — Ambulatory Visit (INDEPENDENT_AMBULATORY_CARE_PROVIDER_SITE_OTHER): Payer: PRIVATE HEALTH INSURANCE | Admitting: Surgery

## 2012-11-05 VITALS — BP 120/78 | HR 68 | Resp 16 | Ht 65.0 in | Wt 183.0 lb

## 2012-11-05 DIAGNOSIS — N63 Unspecified lump in unspecified breast: Secondary | ICD-10-CM

## 2012-11-05 DIAGNOSIS — N6310 Unspecified lump in the right breast, unspecified quadrant: Secondary | ICD-10-CM

## 2012-11-05 NOTE — Progress Notes (Signed)
NAME: Mackenzie Meyers                                            DOB: 09-14-1970 DATE: 11/05/2012                                                  MRN: [id number]  CC:  Chief Complaint  Patient presents with  . Routine Post Op    removal right breast mass 10/15/2012    HPI: This patient comes in for post op follow-up .Sheunderwent removal of right breast mass on 10/15/12. She feels that she is doing well.Main issue has been constipation.   PE:  VITAL SIGNS: BP 120/78  Pulse 68  Resp 16  Ht 5\' 5"  (1.651 m)  Wt 183 lb (83.008 kg)  BMI 30.45 kg/m2  General: The patient appears to be healthy, NAD Breast: Incision healing nicely  DATA REVIEWED: Diagnosis Breast, excision, Right - BENIGN BREAST PARENCHYMA WITH EXTENSIVE FAT NECROSIS AND ASSOCIATED CALCIFICATION. - NO ATYPIA, HYPERPLASIA OR MALIGNANCY IDENTIFIED. Zandra Abts MD Pathologist, Electronic Signature (Case signed 10/19/2012)  IMPRESSION: The patient is doing well S/P removal of left breast mas.    PLAN: RTC PRN Ok to resume all normal activities Gave her a copy of the path and discussed it with her

## 2012-11-05 NOTE — Patient Instructions (Signed)
Try some Miralax for the constipation We will see you again on an as needed basis. Please call the office at [phone] if you have any questions or concerns. Thank you for allowing [place] to take care of you.

## 2012-11-10 ENCOUNTER — Encounter (INDEPENDENT_AMBULATORY_CARE_PROVIDER_SITE_OTHER): Payer: PRIVATE HEALTH INSURANCE | Admitting: Surgery

## 2012-11-24 ENCOUNTER — Telehealth: Payer: Self-pay | Admitting: *Deleted

## 2012-11-24 MED ORDER — FLUCONAZOLE 150 MG PO TABS: 150.0000 mg | ORAL_TABLET | Freq: Once | ORAL | Status: DC

## 2012-11-24 NOTE — Telephone Encounter (Signed)
Pt is calling c/o yeast infection itching white discharge and irritation, pt has Mirena IUD and states she has recurrent yeast infection with IUD in past. Pt is requesting rx for relief. Last seen on 10/27/12 for annual. Please advise

## 2012-11-24 NOTE — Telephone Encounter (Signed)
Pt informed with the below note. 

## 2012-11-24 NOTE — Telephone Encounter (Signed)
Diflucan 150 mg x1, order written

## 2013-06-15 ENCOUNTER — Encounter: Payer: Self-pay | Admitting: Gynecology

## 2013-06-15 ENCOUNTER — Ambulatory Visit (INDEPENDENT_AMBULATORY_CARE_PROVIDER_SITE_OTHER): Payer: No Typology Code available for payment source | Admitting: Gynecology

## 2013-06-15 DIAGNOSIS — R1031 Right lower quadrant pain: Secondary | ICD-10-CM

## 2013-06-15 DIAGNOSIS — T8389XA Other specified complication of genitourinary prosthetic devices, implants and grafts, initial encounter: Secondary | ICD-10-CM

## 2013-06-15 DIAGNOSIS — G8929 Other chronic pain: Secondary | ICD-10-CM

## 2013-06-15 LAB — CBC WITH DIFFERENTIAL/PLATELET
Basophils Absolute: 0.1 10*3/uL (ref 0.0–0.1)
Basophils Relative: 1 % (ref 0–1)
Eosinophils Absolute: 0.1 10*3/uL (ref 0.0–0.7)
Eosinophils Relative: 1 % (ref 0–5)
HCT: 41.8 % (ref 36.0–46.0)
Hemoglobin: 14.4 g/dL (ref 12.0–15.0)
Lymphocytes Relative: 25 % (ref 12–46)
Lymphs Abs: 1.9 10*3/uL (ref 0.7–4.0)
MCH: 28.3 pg (ref 26.0–34.0)
MCHC: 34.4 g/dL (ref 30.0–36.0)
MCV: 82.3 fL (ref 78.0–100.0)
Monocytes Absolute: 0.6 10*3/uL (ref 0.1–1.0)
Monocytes Relative: 8 % (ref 3–12)
Neutro Abs: 4.9 10*3/uL (ref 1.7–7.7)
Neutrophils Relative %: 65 % (ref 43–77)
Platelets: 315 10*3/uL (ref 150–400)
RBC: 5.08 MIL/uL (ref 3.87–5.11)
RDW: 13.6 % (ref 11.5–15.5)
WBC: 7.7 10*3/uL (ref 4.0–10.5)

## 2013-06-15 NOTE — Patient Instructions (Signed)
Follow up for lab results and ultrasound as scheduled. 

## 2013-06-15 NOTE — Progress Notes (Signed)
Patient presents with one-month history of bloating, right lower quadrant discomfort that comes and goes, burping and fatigue. Patient relates similar symptoms when she was pregnant before. Has Mirena IUD that was placed 08/2009. Having occasional spotting.  Exam with [name] assistant Abdomen soft nontender without masses guarding rebound or organomegaly. Pelvic external BUS vagina normal. Cervix normal IUD string not visualized. Uterus anteverted normal size midline mobile nontender. Adnexa without masses or tenderness.  Assessment and plan: 1. Above symptomatology. Check basic lab work to include hCG comprehensive metabolic panel CBC TSH prolactin urinalysis and baseline ultrasound. Differential to include pregnancy, ovarian cyst, IUD related, other etiology. If pregnant need to c pull the IUD discussed. If not pregnant and all of the above studies are normal then she is planning to followup with her primary for further evaluation. Doubt IUD related at this point given that she's had no symptoms up until this time. 2. Questions about pursuing pregnancy next year. Discussed need to have IUD out several months before hand. Preconceptual multivitamin with folic acid. Increased risks of infertility associated with advancing maternal age. Increased risk of maternal disease such as hypertension diabetes and increasing fetal risks to include increased risk of SAB and increased risk of chromosomal abnormalities including trisomy 21,18 and 13. Availabilities prenatal diagnostic testing available to include serum, ultrasound, amniocentesis and [facility]. Not actively being followed for any medical issues or taking any significant medications.

## 2013-06-16 LAB — COMPREHENSIVE METABOLIC PANEL
ALT: 13 U/L (ref 0–35)
AST: 13 U/L (ref 0–37)
Albumin: 4.2 g/dL (ref 3.5–5.2)
Alkaline Phosphatase: 59 U/L (ref 39–117)
BUN: 12 mg/dL (ref 6–23)
CO2: 26 mEq/L (ref 19–32)
Calcium: 9.3 mg/dL (ref 8.4–10.5)
Chloride: 103 mEq/L (ref 96–112)
Creat: 0.88 mg/dL (ref 0.50–1.10)
Glucose, Bld: 86 mg/dL (ref 70–99)
Potassium: 4.2 mEq/L (ref 3.5–5.3)
Sodium: 138 mEq/L (ref 135–145)
Total Bilirubin: 0.5 mg/dL (ref 0.3–1.2)
Total Protein: 6.9 g/dL (ref 6.0–8.3)

## 2013-06-16 LAB — URINALYSIS W MICROSCOPIC + REFLEX CULTURE
Bilirubin Urine: NEGATIVE
Casts: NONE SEEN
Crystals: NONE SEEN
Glucose, UA: NEGATIVE mg/dL
Hgb urine dipstick: NEGATIVE
Ketones, ur: NEGATIVE mg/dL
Leukocytes, UA: NEGATIVE
Nitrite: NEGATIVE
Protein, ur: NEGATIVE mg/dL
Specific Gravity, Urine: 1.01 (ref 1.005–1.030)
Urobilinogen, UA: 0.2 mg/dL (ref 0.0–1.0)
pH: 6.5 (ref 5.0–8.0)

## 2013-06-16 LAB — HCG, SERUM, QUALITATIVE: Preg, Serum: NEGATIVE

## 2013-06-16 LAB — TSH: TSH: 1.873 u[IU]/mL (ref 0.350–4.500)

## 2013-06-16 LAB — PROLACTIN: Prolactin: 7.5 ng/mL

## 2013-06-17 LAB — URINE CULTURE
Colony Count: NO GROWTH
Organism ID, Bacteria: NO GROWTH

## 2013-06-18 ENCOUNTER — Encounter: Payer: Self-pay | Admitting: Gynecology

## 2013-06-18 ENCOUNTER — Ambulatory Visit (INDEPENDENT_AMBULATORY_CARE_PROVIDER_SITE_OTHER): Payer: No Typology Code available for payment source | Admitting: Gynecology

## 2013-06-18 ENCOUNTER — Ambulatory Visit (INDEPENDENT_AMBULATORY_CARE_PROVIDER_SITE_OTHER): Payer: No Typology Code available for payment source

## 2013-06-18 DIAGNOSIS — R1031 Right lower quadrant pain: Secondary | ICD-10-CM

## 2013-06-18 DIAGNOSIS — N949 Unspecified condition associated with female genital organs and menstrual cycle: Secondary | ICD-10-CM

## 2013-06-18 DIAGNOSIS — G8929 Other chronic pain: Secondary | ICD-10-CM

## 2013-06-18 DIAGNOSIS — R102 Pelvic and perineal pain: Secondary | ICD-10-CM

## 2013-06-18 DIAGNOSIS — N83209 Unspecified ovarian cyst, unspecified side: Secondary | ICD-10-CM

## 2013-06-18 DIAGNOSIS — T8389XA Other specified complication of genitourinary prosthetic devices, implants and grafts, initial encounter: Secondary | ICD-10-CM

## 2013-06-18 NOTE — Patient Instructions (Signed)
Followup in 2-3 months for ultrasound. Sooner if your pain persists or worsens.

## 2013-06-18 NOTE — Progress Notes (Signed)
Patient follows up for ultrasound. Uterus overall normal in size. Endometrial echo 4.4 mm. IUD visualized within the endometrial cavity. Left ovary normal. Right ovary with thin-walled vascular cyst 22 mm mean with low level echoes consistent with hemorrhagic cyst possible endometrioma.  Assessment and plan: Right-sided pain probably due to the cyst. Other symptoms such as her pain bloating fatigue probably unrelated. Patient has appointment to see her primary in followup for this. Discussed possibilities for endometrioma versus functional. Recommend repeat ultrasound in 2-3 months. If persist or enlarge possible laparoscopy to remove. If pain persists/worsens followup sooner and consider surgery at that point. Patient's comfortable with the plan and the importance of followup.

## 2013-07-01 ENCOUNTER — Other Ambulatory Visit: Payer: Self-pay

## 2013-08-11 ENCOUNTER — Ambulatory Visit (INDEPENDENT_AMBULATORY_CARE_PROVIDER_SITE_OTHER): Payer: No Typology Code available for payment source | Admitting: Gynecology

## 2013-08-11 ENCOUNTER — Encounter: Payer: Self-pay | Admitting: Gynecology

## 2013-08-11 ENCOUNTER — Ambulatory Visit (INDEPENDENT_AMBULATORY_CARE_PROVIDER_SITE_OTHER): Payer: No Typology Code available for payment source

## 2013-08-11 DIAGNOSIS — N949 Unspecified condition associated with female genital organs and menstrual cycle: Secondary | ICD-10-CM

## 2013-08-11 DIAGNOSIS — N83209 Unspecified ovarian cyst, unspecified side: Secondary | ICD-10-CM

## 2013-08-11 DIAGNOSIS — R102 Pelvic and perineal pain: Secondary | ICD-10-CM

## 2013-08-11 NOTE — Patient Instructions (Signed)
Followup March 2015 for annual exam, sooner if any issues.

## 2013-08-11 NOTE — Progress Notes (Signed)
Patient presents for followup ultrasound. Had ultrasound in October with right-sided pain showing a 22 mm hemorrhagic-appearing right ovarian cyst. Questionable endometrioma. Patient was asked to return in several months for repeat ultrasound. Patient notes that her pain has resolved.  Ultrasound shows uterus normal size. Endometrial echo 8.2 mm. IUD visualized within normal position. Right and left ovaries visualized and normal. Prior right ovarian cyst resolved.  Assessment and plan: Resolved hemorrhagic right ovarian cyst. Patient doing well. We'll followup March 2015 when she is due for her annual. Sooner if any issues.

## 2013-09-28 ENCOUNTER — Telehealth: Payer: Self-pay

## 2013-09-28 NOTE — Telephone Encounter (Signed)
Patient called inquiring about her blood type. I called her back to let her know the labs go back to 2012 in the system and I do not have a blood type on her.  I told her [employer] will type her for free when she donates blood or I could check with Dr. Loetta Rough for an order and she could come by and have it drawn if she needs it. (I left message on her voice mail at her request.)

## 2013-10-24 DIAGNOSIS — E559 Vitamin D deficiency, unspecified: Secondary | ICD-10-CM

## 2013-10-24 HISTORY — DX: Vitamin D deficiency, unspecified: E55.9

## 2013-10-29 ENCOUNTER — Ambulatory Visit (INDEPENDENT_AMBULATORY_CARE_PROVIDER_SITE_OTHER): Payer: BC Managed Care – PPO | Admitting: Gynecology

## 2013-10-29 ENCOUNTER — Encounter: Payer: Self-pay | Admitting: Gynecology

## 2013-10-29 VITALS — BP 118/76 | Ht 64.0 in | Wt 198.0 lb

## 2013-10-29 DIAGNOSIS — N898 Other specified noninflammatory disorders of vagina: Secondary | ICD-10-CM

## 2013-10-29 DIAGNOSIS — Z30431 Encounter for routine checking of intrauterine contraceptive device: Secondary | ICD-10-CM

## 2013-10-29 DIAGNOSIS — Z01419 Encounter for gynecological examination (general) (routine) without abnormal findings: Secondary | ICD-10-CM

## 2013-10-29 LAB — WET PREP FOR TRICH, YEAST, CLUE
Clue Cells Wet Prep HPF POC: NONE SEEN
Trich, Wet Prep: NONE SEEN
WBC, Wet Prep HPF POC: NONE SEEN
Yeast Wet Prep HPF POC: NONE SEEN

## 2013-10-29 LAB — LIPID PANEL
Cholesterol: 162 mg/dL (ref 0–200)
HDL: 47 mg/dL (ref >39)
LDL Cholesterol: 97 mg/dL (ref 0–99)
Total CHOL/HDL Ratio: 3.4 Ratio
Triglycerides: 91 mg/dL (ref <150)
VLDL: 18 mg/dL (ref 0–40)

## 2013-10-29 NOTE — Patient Instructions (Signed)
Followup for removal of your IUD at your choice. Start on multivitamin with folate acid such as prenatal vitamin months before you anticipate pregnancy trial Followup for your mammogram now Followup in one year for annual exam in one year if you do not achieve pregnancy in the interim  You may obtain a copy of any labs that were done today by logging onto MyChart as outlined in the instructions provided with your AVS (after visit summary). The office will not call with normal lab results but certainly if there are any significant abnormalities then we will contact you.

## 2013-10-29 NOTE — Progress Notes (Signed)
Mackenzie Meyers Jan 20, 1971 [id number]        43 y.o.  [id number] for annual exam.  Several issues noted below.  Past medical history,surgical history, problem list, medications, allergies, family history and social history were all reviewed and documented in the EPIC chart.  ROS:  Performed and pertinent positives and negatives are included in the history, assessment and plan .  Exam: [name] assistant Filed Vitals:   10/29/13 0848  BP: 118/76  Height: 5\' 4"  (1.626 m)  Weight: 198 lb (89.812 kg)   General appearance  Normal Skin grossly normal Head/Neck normal with no cervical or supraclavicular adenopathy thyroid normal Lungs  clear Cardiac RR, without RMG Abdominal  soft, nontender, without masses, organomegaly or hernia Breasts  examined lying and sitting without masses, retractions, discharge or axillary adenopathy. Well-healed bilateral reduction scars Pelvic  Ext/BUS/vagina slight white discharge  Cervix normal, IUD string not visualized  Uterus anteverted, normal size, shape and contour, midline and mobile nontender   Adnexa  Without masses or tenderness    Anus and perineum  Normal   Rectovaginal  Normal sphincter tone without palpated masses or tenderness.    Assessment/Plan:  43 y.o. [id number] female for annual exam amenorrheic, Mirena IUD.   1. Mirena IUD 08/2009. Amenorrheic. IUD string not visualized as in the past. Recent ultrasound documents intrauterine placement. Patient contemplating having removed in June for pregnancy trial. Will followup for this. Starting on a multivitamin with folate acid preconceptionally encouraged. 2. Slight discharge noted by patient. Wet prep negative. The patient will monitor if persists/worsens she'll call. Otherwise we'll follow at present. 3. Pap smear 2013. No Pap smear done today. History of cryosurgery age 43 with normal Pap smears since then. Plan repeat Pap smear next year 3 year interval. 4. Mammography due now patient is to schedule  this. SBE monthly reviewed. 5. History of vitamin D deficiency. Check vitamin D level today. She is on vitamin D supplementation. 6. Health maintenance. Comprehensive metabolic panel, CBC, TSH, prolactin recently done in the fall. Check lipid profile TSH and vitamin D today. Followup for IUD removal as patient chooses otherwise 1 year for annual exam.   Note: This document was prepared with digital dictation and possible smart phrase technology. Any transcriptional errors that result from this process are unintentional.   [name] MD, 9:21 AM 10/29/2013

## 2013-10-30 LAB — URINALYSIS W MICROSCOPIC + REFLEX CULTURE
Bilirubin Urine: NEGATIVE
Casts: NONE SEEN
Crystals: NONE SEEN
Glucose, UA: NEGATIVE mg/dL
Hgb urine dipstick: NEGATIVE
Ketones, ur: NEGATIVE mg/dL
Leukocytes, UA: NEGATIVE
Nitrite: NEGATIVE
Protein, ur: NEGATIVE mg/dL
Specific Gravity, Urine: 1.02 (ref 1.005–1.030)
Urobilinogen, UA: 0.2 mg/dL (ref 0.0–1.0)
pH: 6.5 (ref 5.0–8.0)

## 2013-10-30 LAB — VITAMIN D 25 HYDROXY (VIT D DEFICIENCY, FRACTURES): Vit D, 25-Hydroxy: 29 ng/mL — ABNORMAL LOW (ref 30–89)

## 2013-11-01 ENCOUNTER — Other Ambulatory Visit: Payer: Self-pay

## 2013-11-01 DIAGNOSIS — Z1231 Encounter for screening mammogram for malignant neoplasm of breast: Secondary | ICD-10-CM

## 2013-11-02 ENCOUNTER — Encounter: Payer: Self-pay | Admitting: Gynecology

## 2013-11-12 ENCOUNTER — Ambulatory Visit: Payer: PRIVATE HEALTH INSURANCE

## 2013-11-15 ENCOUNTER — Telehealth: Payer: Self-pay | Admitting: Gynecology

## 2013-11-15 NOTE — Telephone Encounter (Signed)
11/15/13-Spoke w/ pt ref benefits to remove and replace IUD. It is covered at 100% under preventative services. Pt advised that she only wants the old one removed and not have new one inserted. Told her that an office visit would not be covered then at 100%/wl

## 2013-11-26 ENCOUNTER — Ambulatory Visit
Admission: RE | Admit: 2013-11-26 | Discharge: 2013-11-26 | Disposition: A | Discharge status: Home / Self Care | Payer: BC Managed Care – PPO | Source: Ambulatory Visit

## 2013-11-26 DIAGNOSIS — Z1231 Encounter for screening mammogram for malignant neoplasm of breast: Secondary | ICD-10-CM

## 2014-01-10 LAB — OB RESULTS CONSOLE GBS: GBS: NEGATIVE

## 2014-02-08 ENCOUNTER — Encounter: Payer: Self-pay | Admitting: Gynecology

## 2014-02-08 ENCOUNTER — Ambulatory Visit (INDEPENDENT_AMBULATORY_CARE_PROVIDER_SITE_OTHER): Payer: BC Managed Care – PPO | Admitting: Gynecology

## 2014-02-08 DIAGNOSIS — Z30432 Encounter for removal of intrauterine contraceptive device: Secondary | ICD-10-CM

## 2014-02-08 DIAGNOSIS — N882 Stricture and stenosis of cervix uteri: Secondary | ICD-10-CM

## 2014-02-08 NOTE — Progress Notes (Signed)
Mackenzie Meyers 03/18/1971 [id number]        43 y.o.  [id number] presents to have her IUD removed. She and her husband are planning pregnancy. She is on extra folic acid.  Past medical history,surgical history, problem list, medications, allergies, family history and social history were all reviewed and documented in the EPIC chart.  Directed ROS with pertinent positives and negatives documented in the history of present illness/assessment and plan.  Exam: [name] assistant General appearance  Normal Abdomen soft nontender without masses or guarding Pelvic external BUS vagina normal. Cervix normal with cervical os slightly stenotic. String not visualized. Uterus anteverted normal size mobile nontender. Adnexa without masses or tenderness.  Procedure: Due to the cervical stenosis and need to dilate the cervix to admit the [facility] forcep a paracervical block using 1% lidocaine was placed under sterile technique. 8 cc total. Single-tooth tenaculum anterior lip stabilization. Cervix was dilated with a disposable gradated dilator and subsequently the [place] forceps was introduced into the cervical os open and closed and the Mirena IUD string was grasped and the IUD removed, shown to the patient and discarded.   Assessment/Plan:  43 y.o. [id number] for IUD removal as above. Recommended starting on a prenatal vitamin now. Does not smoke. Is not on any current medications. General pregnancy recommendations reviewed to include avoiding alcohol. Issues of increased chromosomal abnormalities associated with advanced maternal age also discussed the availability of prenatal diagnostic techniques reviewed. She understands we do not do obstetrics and will followup with an obstetrician when she achieves pregnancy.   Note: This document was prepared with digital dictation and possible smart phrase technology. Any transcriptional errors that result from this process are unintentional.   [name] MD, 9:53 AM  02/08/2014

## 2014-02-08 NOTE — Patient Instructions (Signed)
Start on a prenatal vitamin daily. Research and chose an obstetrical group to followup with once you achieve pregnancy

## 2014-02-14 ENCOUNTER — Ambulatory Visit: Payer: BC Managed Care – PPO | Admitting: Gynecology

## 2014-05-12 ENCOUNTER — Telehealth: Payer: Self-pay | Admitting: Family Medicine

## 2014-05-13 ENCOUNTER — Ambulatory Visit (INDEPENDENT_AMBULATORY_CARE_PROVIDER_SITE_OTHER): Payer: BC Managed Care – PPO | Admitting: Family

## 2014-05-13 ENCOUNTER — Ambulatory Visit: Payer: Self-pay | Admitting: Family

## 2014-05-13 ENCOUNTER — Encounter: Payer: Self-pay | Admitting: Family

## 2014-05-13 ENCOUNTER — Encounter (INDEPENDENT_AMBULATORY_CARE_PROVIDER_SITE_OTHER): Payer: Self-pay

## 2014-05-13 VITALS — BP 124/76 | HR 84 | Temp 97.4°F | Ht 64.0 in | Wt 196.6 lb

## 2014-05-13 DIAGNOSIS — R252 Cramp and spasm: Secondary | ICD-10-CM

## 2014-05-13 DIAGNOSIS — M546 Pain in thoracic spine: Secondary | ICD-10-CM

## 2014-05-13 MED ORDER — MELOXICAM 15 MG PO TABS: 15.0000 mg | ORAL_TABLET | Freq: Every day | ORAL | Status: DC

## 2014-05-13 MED ORDER — CYCLOBENZAPRINE HCL 10 MG PO TABS: 10.0000 mg | ORAL_TABLET | Freq: Three times a day (TID) | ORAL | Status: DC | PRN

## 2014-05-13 NOTE — Telephone Encounter (Signed)
Patient has [place] and is currently taking prenatal vitamins but no other meds. She is having some back spasms.

## 2014-05-13 NOTE — Patient Instructions (Signed)
Back Pain, Adult Low back pain is very common. About 1 in 5 people have back pain.The cause of low back pain is rarely dangerous. The pain often gets better over time.About half of people with a sudden onset of back pain feel better in just 2 weeks. About 8 in 10 people feel better by 6 weeks.  CAUSES Some common causes of back pain include:  Strain of the muscles or ligaments supporting the spine.  Wear and tear (degeneration) of the spinal discs.  Arthritis.  Direct injury to the back. DIAGNOSIS Most of the time, the direct cause of low back pain is not known.However, back pain can be treated effectively even when the exact cause of the pain is unknown.Answering your caregiver's questions about your overall health and symptoms is one of the most accurate ways to make sure the cause of your pain is not dangerous. If your caregiver needs more information, he or she may order lab work or imaging tests (X-rays or MRIs).However, even if imaging tests show changes in your back, this usually does not require surgery. HOME CARE INSTRUCTIONS For many people, back pain returns.Since low back pain is rarely dangerous, it is often a condition that people can learn to manageon their own.   Remain active. It is stressful on the back to sit or stand in one place. Do not sit, drive, or stand in one place for more than 30 minutes at a time. Take short walks on level surfaces as soon as pain allows.Try to increase the length of time you walk each [place].  Do not stay in bed.Resting more than 1 or 2 days can delay your recovery.  Do not avoid exercise or work.Your body is made to move.It is not dangerous to be active, even though your back may hurt.Your back will likely heal faster if you return to being active before your pain is gone.  Pay attention to your body when you bend and lift. Many people have less discomfortwhen lifting if they bend their knees, keep the load close to their bodies,and  avoid twisting. Often, the most comfortable positions are those that put less stress on your recovering back.  Find a comfortable position to sleep. Use a firm mattress and lie on your side with your knees slightly bent. If you lie on your back, put a pillow under your knees.  Only take over-the-counter or prescription medicines as directed by your caregiver. Over-the-counter medicines to reduce pain and inflammation are often the most helpful.Your caregiver may prescribe muscle relaxant drugs.These medicines help dull your pain so you can more quickly return to your normal activities and healthy exercise.  Put ice on the injured area.  Put ice in a plastic bag.  Place a towel between your skin and the bag.  Leave the ice on for 15-20 minutes, 03-04 times a [place] for the first 2 to 3 days. After that, ice and heat may be alternated to reduce pain and spasms.  Ask your caregiver about trying back exercises and gentle massage. This may be of some benefit.  Avoid feeling anxious or stressed.Stress increases muscle tension and can worsen back pain.It is important to recognize when you are anxious or stressed and learn ways to manage it.Exercise is a great option. SEEK MEDICAL CARE IF:  You have pain that is not relieved with rest or medicine.  You have pain that does not improve in 1 week.  You have new symptoms.  You are generally not feeling well. SEEK   IMMEDIATE MEDICAL CARE IF:   You have pain that radiates from your back into your legs.  You develop new bowel or bladder control problems.  You have unusual weakness or numbness in your arms or legs.  You develop nausea or vomiting.  You develop abdominal pain.  You feel faint. Document Released: 08/12/2005 Document Revised: 02/11/2012 Document Reviewed: 12/14/2013 ExitCare Patient Information 2015 [employer]. This information is not intended to replace advice given to you by your health care provider. Make sure you  discuss any questions you have with your health care provider.  

## 2014-05-13 NOTE — Progress Notes (Signed)
   Subjective:    Patient ID: Mackenzie Meyers, female    DOB: 1971-02-02, 43 y.o.   MRN: [id number]  Back Pain This is a chronic problem. The current episode started more than 1 year ago. The problem occurs intermittently. The problem has been waxing and waning since onset. The pain is present in the thoracic spine. The quality of the pain is described as cramping and stabbing ([place]). The pain does not radiate. The pain is at a severity of 8/10. The pain is moderate. The symptoms are aggravated by twisting, lying down and standing. Pertinent negatives include no bladder incontinence, bowel incontinence, headaches, leg pain, perianal numbness or tingling. She has tried muscle relaxant and ice for the symptoms. The treatment provided mild relief.      Review of Systems  Constitutional: Negative.   HENT: Negative.   Eyes: Negative.   Respiratory: Negative.  Negative for shortness of breath.   Cardiovascular: Negative.  Negative for palpitations.  Gastrointestinal: Negative.  Negative for bowel incontinence.  Endocrine: Negative.   Genitourinary: Negative.  Negative for bladder incontinence.  Musculoskeletal: Positive for back pain.  Neurological: Negative.  Negative for tingling and headaches.  Hematological: Negative.   Psychiatric/Behavioral: Negative.   All other systems reviewed and are negative.      Objective:   Physical Exam  Vitals reviewed. Constitutional: She is oriented to person, place, and time. She appears well-developed and well-nourished. No distress.  Eyes: Pupils are equal, round, and reactive to light.  Neck: Normal range of motion. Neck supple. No thyromegaly present.  Cardiovascular: Normal rate, regular rhythm, normal heart sounds and intact distal pulses.   No murmur heard. Pulmonary/Chest: Effort normal and breath sounds normal. No respiratory distress. She has no wheezes.  Abdominal: Soft. Bowel sounds are normal. She exhibits no distension. There is no  tenderness.  Musculoskeletal: Normal range of motion. She exhibits no edema and no tenderness.  Neurological: She is alert and oriented to person, place, and time. She has normal reflexes. No cranial nerve deficit.  Skin: Skin is warm and dry.  Psychiatric: She has a normal mood and affect. Her behavior is normal. Judgment and thought content normal.      BP 124/76  Pulse 84  Temp(Src) 97.4 F (36.3 C) (Oral)  Ht $R'5\' 4"'YB$  (1.626 m)  Wt 196 lb 9.6 oz (89.177 kg)  BMI 33.73 kg/m2  LMP 04/23/2014     Assessment & Plan:  1. Right-sided thoracic back pain -Rest -Ice - meloxicam (MOBIC) 15 MG tablet; Take 1 tablet (15 mg total) by mouth daily.  Dispense: 30 tablet; Refill: 3 - cyclobenzaprine (FLEXERIL) 10 MG tablet; Take 1 tablet (10 mg total) by mouth 3 (three) times daily as needed for muscle spasms.  Dispense: 30 tablet; Refill: 1 - BMP8+EGFR  2. Muscle cramp - BMP8+EGFR - Magnesium   Evelina Dun, FNP

## 2014-05-14 LAB — BMP8+EGFR
BUN/Creatinine Ratio: 12 (ref 9–23)
BUN: 11 mg/dL (ref 6–24)
CO2: 25 mmol/L (ref 18–29)
Calcium: 10.2 mg/dL (ref 8.7–10.2)
Chloride: 103 mmol/L (ref 97–108)
Creatinine, Ser: 0.91 mg/dL (ref 0.57–1.00)
GFR calc Af Amer: 91 mL/min/{1.73_m2} (ref >59)
GFR calc non Af Amer: 79 mL/min/{1.73_m2} (ref >59)
Glucose: 101 mg/dL — ABNORMAL HIGH (ref 65–99)
Potassium: 5.1 mmol/L (ref 3.5–5.2)
Sodium: 146 mmol/L — ABNORMAL HIGH (ref 134–144)

## 2014-05-14 LAB — MAGNESIUM: Magnesium: 2.2 mg/dL (ref 1.6–2.6)

## 2014-06-27 ENCOUNTER — Encounter: Payer: Self-pay | Admitting: Family

## 2014-06-30 ENCOUNTER — Encounter: Payer: Self-pay | Admitting: *Deleted

## 2014-06-30 ENCOUNTER — Encounter: Payer: BC Managed Care – PPO | Attending: Obstetrics | Admitting: *Deleted

## 2014-06-30 VITALS — Ht 64.5 in | Wt 199.2 lb

## 2014-06-30 DIAGNOSIS — O2600 Excessive weight gain in pregnancy, unspecified trimester: Secondary | ICD-10-CM

## 2014-06-30 DIAGNOSIS — Z713 Dietary counseling and surveillance: Secondary | ICD-10-CM | POA: Diagnosis not present

## 2014-06-30 NOTE — Progress Notes (Signed)
  Medical Nutrition Therapy:  Appt start time: 1300 end time:  1400.   Assessment:  Primary concerns today: Mackenzie Meyers is here for nutrition counseling pertaining to excessive weight gain during pregnancy.  Currently 7 and [redacted] weeks gestation.  G4P2.  In her previous pregnancies she gained 60 pounds each time and the infants were not born LGA.  She did not receive any nutrition counseling in her previous pregnancies.  She reports gaining 35 pounds in the past 2 years from stress and medical challenges. Pregravid weight 195 pounds, currently 199 pounds.   [name] does the grocery shopping and she reports cooking.  She is in school full-time.  She reports using a variety of cooking methods.  She has composed a binger of all her meals.  Has been eating more recently: [employer] (12" tuna sandwich), McDonald's, Wendy's (large burger with large fry and sweet tea) because she's not feeling well enough to cook: 7 times/month. She eats at the kitchen table for supper, but might watch tv during lunch and reports being a fast eater.  Hasn't gotten second portions since pregnancy, but brefore the time she gets second portions 50% of the time.  She overeats pasta usually.  Now that she's pregnant, she feels bloated easily and feels like she's eating less than before.  No reflux or constipation; has some diarrhea last night, but that's not normal.  Does complain of bloating.   Denies emotional eating  Preferred Learning Style:   No preference indicated   Learning Readiness:   Ready   MEDICATIONS: prenatal vitamin   DIETARY INTAKE:  Usual eating pattern includes 2-3 meals and 1-3 snacks per [place].  Everyday foods include proteins, starches, sometimes fruit and vegetables.  Avoided foods include none.    24-hr recall:  B ( AM): bacon and eggs; feta quiche.  Skips often Snk ( AM): fruit or nuts or whole wheat fig newtons or crackers  L ( PM): soup and sandwich Snk ( PM): Nabs or fruit or crackers or buttered toast D  ( PM): glazed pork chops and spinach pie; soup and cornbread; shepard's pie Snk ( PM): used to eat ice cream daily, but not as much lately Beverages: used to drink dr pepper and sweet tea, decaff coffee with creamer; currently no soda, but does drink decaff tea and is trying to drink water or gingerale  Usual physical activity: none outside of ADLs  Estimated energy needs: 1800 calories 200 g carbohydrates 135 g protein 50 g fat    Nutritional Diagnosis:  NB-1.1 Food and nutrition-related knowledge deficit As related to proper balance of fats, carbohydrates, and proteins.  As evidenced by patient self-report.    Intervention:  Nutrition counseling provided.  Discussed general nutrition recommendations for pregnancy: avoid unpasteurized products, heat deli meats thoroughly, limit high mercury fish, avoid alcohol and caffeine.  Discussed weight gain guidelines: no need to eat additional calories during first 2 trimesters.  Discussed MyPlate recommendations for meal planning: 1/4 meal starches, proteins, fruits, and vegetables.  Recommended increased calcium-rich dairy and decreasing sugary-beverages.  Discussed mindful eating: eat more slowly and without distractions in order to limit risk for overeating.  Also recommended daily physical activity of 20 minutes walking/Welcome  Teaching Method Utilized:  Visual Auditory   Barriers to learning/adherence to lifestyle change: none  Demonstrated degree of understanding via:  Teach Back   Monitoring/Evaluation:  Dietary intake, exercise, and body weight in 2 month(s).

## 2014-07-29 LAB — OB RESULTS CONSOLE HIV ANTIBODY (ROUTINE TESTING): HIV: NONREACTIVE

## 2014-07-29 LAB — OB RESULTS CONSOLE ANTIBODY SCREEN: Antibody Screen: NEGATIVE

## 2014-07-29 LAB — OB RESULTS CONSOLE RUBELLA ANTIBODY, IGM: Rubella: IMMUNE

## 2014-07-29 LAB — OB RESULTS CONSOLE HEPATITIS B SURFACE ANTIGEN: Hepatitis B Surface Ag: NEGATIVE

## 2014-07-29 LAB — OB RESULTS CONSOLE ABO/RH: RH Type: POSITIVE

## 2014-07-29 LAB — OB RESULTS CONSOLE RPR: RPR: NONREACTIVE

## 2014-11-19 LAB — OB RESULTS CONSOLE RPR: RPR: NONREACTIVE

## 2014-12-14 ENCOUNTER — Encounter: Payer: BLUE CROSS/BLUE SHIELD | Attending: Obstetrics and Gynecology

## 2014-12-14 VITALS — Ht 64.0 in | Wt 217.3 lb

## 2014-12-14 DIAGNOSIS — Z713 Dietary counseling and surveillance: Secondary | ICD-10-CM | POA: Insufficient documentation

## 2014-12-14 DIAGNOSIS — R7309 Other abnormal glucose: Secondary | ICD-10-CM

## 2014-12-14 DIAGNOSIS — R7302 Impaired glucose tolerance (oral): Secondary | ICD-10-CM | POA: Diagnosis present

## 2014-12-14 NOTE — Progress Notes (Signed)
  Patient was seen on 12/14/14 for Gestational Diabetes self-management class at the [facility]. The following learning objectives were met by the patient during this course:   States the definition of Gestational Diabetes  States why dietary management is important in controlling blood glucose  Describes the effects each nutrient has on blood glucose levels  Demonstrates ability to create a balanced meal plan  Demonstrates carbohydrate counting   States when to check blood glucose levels  Demonstrates proper blood glucose monitoring techniques  States the effect of stress and exercise on blood glucose levels  States the importance of limiting caffeine and abstaining from alcohol and smoking  Blood glucose monitor given:  One Touch Ultra Mini Self Monitoring Kit Lot # [id number] x Exp: 05/16/15 Blood glucose reading: 123 mg/dl  Patient instructed to monitor glucose levels: FBS: 60 - <90 1 hour: <140 2 hour: <120  *Patient received handouts:  Nutrition Diabetes and Pregnancy  Carbohydrate Counting List  Patient will be seen for follow-up as needed.

## 2014-12-29 DIAGNOSIS — Z0289 Encounter for other administrative examinations: Secondary | ICD-10-CM

## 2015-01-11 ENCOUNTER — Other Ambulatory Visit: Payer: Self-pay | Admitting: Obstetrics and Gynecology

## 2015-01-11 LAB — OB RESULTS CONSOLE GBS: GBS: NEGATIVE

## 2015-01-17 ENCOUNTER — Other Ambulatory Visit: Payer: Self-pay | Admitting: Obstetrics & Gynecology

## 2015-01-30 ENCOUNTER — Inpatient Hospital Stay (HOSPITAL_COMMUNITY)
Admission: AD | Admit: 2015-01-30 | Discharge: 2015-01-30 | Disposition: A | Discharge status: Home / Self Care | Payer: BLUE CROSS/BLUE SHIELD | Source: Ambulatory Visit | Attending: Obstetrics and Gynecology | Admitting: Obstetrics and Gynecology

## 2015-01-30 ENCOUNTER — Encounter (HOSPITAL_COMMUNITY): Payer: Self-pay | Admitting: *Deleted

## 2015-01-30 DIAGNOSIS — O368131 Decreased fetal movements, third trimester, fetus 1: Secondary | ICD-10-CM | POA: Diagnosis not present

## 2015-01-30 DIAGNOSIS — Z87891 Personal history of nicotine dependence: Secondary | ICD-10-CM | POA: Diagnosis not present

## 2015-01-30 DIAGNOSIS — O368130 Decreased fetal movements, third trimester, not applicable or unspecified: Secondary | ICD-10-CM | POA: Insufficient documentation

## 2015-01-30 DIAGNOSIS — Z3689 Encounter for other specified antenatal screening: Secondary | ICD-10-CM

## 2015-01-30 DIAGNOSIS — Z3A38 38 weeks gestation of pregnancy: Secondary | ICD-10-CM | POA: Diagnosis not present

## 2015-01-30 NOTE — MAU Note (Signed)
Pt reports she has only felt fetal movement one time today, contractions q 10 minutes off/on today.

## 2015-01-30 NOTE — Discharge Instructions (Signed)
Third Trimester of Pregnancy The third trimester is from week 29 through week 42, months 7 through 9. The third trimester is a time when the fetus is growing rapidly. At the end of the ninth month, the fetus is about 20 inches in length and weighs 6-10 pounds.  BODY CHANGES Your body goes through many changes during pregnancy. The changes vary from woman to woman.   Your weight will continue to increase. You can expect to gain 25-35 pounds (11-16 kg) by the end of the pregnancy.  You may begin to get stretch marks on your hips, abdomen, and breasts.  You may urinate more often because the fetus is moving lower into your pelvis and pressing on your bladder.  You may develop or continue to have heartburn as a result of your pregnancy.  You may develop constipation because certain hormones are causing the muscles that push waste through your intestines to slow down.  You may develop hemorrhoids or swollen, bulging veins (varicose veins).  You may have pelvic pain because of the weight gain and pregnancy hormones relaxing your joints between the bones in your pelvis. Backaches may result from overexertion of the muscles supporting your posture.  You may have changes in your hair. These can include thickening of your hair, rapid growth, and changes in texture. Some women also have hair loss during or after pregnancy, or hair that feels dry or thin. Your hair will most likely return to normal after your baby is born.  Your breasts will continue to grow and be tender. A yellow discharge may leak from your breasts called colostrum.  Your belly button may stick out.  You may feel short of breath because of your expanding uterus.  You may notice the fetus "dropping," or moving lower in your abdomen.  You may have a bloody mucus discharge. This usually occurs a few days to a week before labor begins.  Your cervix becomes thin and soft (effaced) near your due date. WHAT TO EXPECT AT YOUR PRENATAL  EXAMS  You will have prenatal exams every 2 weeks until week 36. Then, you will have weekly prenatal exams. During a routine prenatal visit:  You will be weighed to make sure you and the fetus are growing normally.  Your blood pressure is taken.  Your abdomen will be measured to track your baby's growth.  The fetal heartbeat will be listened to.  Any test results from the previous visit will be discussed.  You may have a cervical check near your due date to see if you have effaced. At around 36 weeks, your caregiver will check your cervix. At the same time, your caregiver will also perform a test on the secretions of the vaginal tissue. This test is to determine if a type of bacteria, Group B streptococcus, is present. Your caregiver will explain this further. Your caregiver may ask you:  What your birth plan is.  How you are feeling.  If you are feeling the baby move.  If you have had any abnormal symptoms, such as leaking fluid, bleeding, severe headaches, or abdominal cramping.  If you have any questions. Other tests or screenings that may be performed during your third trimester include:  Blood tests that check for low iron levels (anemia).  Fetal testing to check the health, activity level, and growth of the fetus. Testing is done if you have certain medical conditions or if there are problems during the pregnancy. FALSE LABOR You may feel small, irregular contractions that   eventually go away. These are called Braxton Hicks contractions, or false labor. Contractions may last for hours, days, or even weeks before true labor sets in. If contractions come at regular intervals, intensify, or become painful, it is best to be seen by your caregiver.  SIGNS OF LABOR   Menstrual-like cramps.  Contractions that are 5 minutes apart or less.  Contractions that start on the top of the uterus and spread down to the lower abdomen and back.  A sense of increased pelvic pressure or back  pain.  A watery or bloody mucus discharge that comes from the vagina. If you have any of these signs before the 37th week of pregnancy, call your caregiver right away. You need to go to the hospital to get checked immediately. HOME CARE INSTRUCTIONS   Avoid all smoking, herbs, alcohol, and unprescribed drugs. These chemicals affect the formation and growth of the baby.  Follow your caregiver's instructions regarding medicine use. There are medicines that are either safe or unsafe to take during pregnancy.  Exercise only as directed by your caregiver. Experiencing uterine cramps is a good sign to stop exercising.  Continue to eat regular, healthy meals.  Wear a good support bra for breast tenderness.  Do not use hot tubs, steam rooms, or saunas.  Wear your seat belt at all times when driving.  Avoid raw meat, uncooked cheese, cat litter boxes, and soil used by cats. These carry germs that can cause birth defects in the baby.  Take your prenatal vitamins.  Try taking a stool softener (if your caregiver approves) if you develop constipation. Eat more high-fiber foods, such as fresh vegetables or fruit and whole grains. Drink plenty of fluids to keep your urine clear or pale yellow.  Take warm sitz baths to soothe any pain or discomfort caused by hemorrhoids. Use hemorrhoid cream if your caregiver approves.  If you develop varicose veins, wear support hose. Elevate your feet for 15 minutes, 3-4 times a Klauer. Limit salt in your diet.  Avoid heavy lifting, wear low heal shoes, and practice good posture.  Rest a lot with your legs elevated if you have leg cramps or low back pain.  Visit your dentist if you have not gone during your pregnancy. Use a soft toothbrush to brush your teeth and be gentle when you floss.  A sexual relationship may be continued unless your caregiver directs you otherwise.  Do not travel far distances unless it is absolutely necessary and only with the approval  of your caregiver.  Take prenatal classes to understand, practice, and ask questions about the labor and delivery.  Make a trial run to the hospital.  Pack your hospital bag.  Prepare the baby's nursery.  Continue to go to all your prenatal visits as directed by your caregiver. SEEK MEDICAL CARE IF:  You are unsure if you are in labor or if your water has broken.  You have dizziness.  You have mild pelvic cramps, pelvic pressure, or nagging pain in your abdominal area.  You have persistent nausea, vomiting, or diarrhea.  You have a bad smelling vaginal discharge.  You have pain with urination. SEEK IMMEDIATE MEDICAL CARE IF:   You have a fever.  You are leaking fluid from your vagina.  You have spotting or bleeding from your vagina.  You have severe abdominal cramping or pain.  You have rapid weight loss or gain.  You have shortness of breath with chest pain.  You notice sudden or extreme swelling   of your face, hands, ankles, feet, or legs.  You have not felt your baby move in over an hour.  You have severe headaches that do not go away with medicine.  You have vision changes. Document Released: 08/06/2001 Document Revised: 08/17/2013 Document Reviewed: 10/13/2012 ExitCare Patient Information 2015 [employer]. This information is not intended to replace advice given to you by your health care provider. Make sure you discuss any questions you have with your health care provider.  

## 2015-01-30 NOTE — MAU Provider Note (Signed)
History     CSN: [id number]  Arrival date and time: 01/30/15 2148   First Provider Initiated Contact with Patient 01/30/15 2250      No chief complaint on file.  HPI  Mackenzie Meyers is a 44 y.o. [id number] at [redacted]w[redacted]d who presents today with decreased fetal movement. She states that she had not felt the baby move all [place]. She reports some occasional contractions, and lower back pain. She denies any vaginal bleeding or LOF. She is having pain along her pubic bone as well. She is scheduled for a repeat c-section on 02/07/15. She reports that since being here, and being on the monitor the fetus has been active.   Past Medical History  Diagnosis Date  . Heart murmur     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  . Arthritis     hands  . Anxiety   . Breast mass, right 09/2012  . PONV (postoperative nausea and vomiting)     hx. post-op nausea  . Sinus congestion 10/12/2012  . History of kidney stones 10/2011  . Vitiligo   . Cervical dysplasia age 34  . Vitamin D deficiency 10/2013    Value 29  . GDM (gestational diabetes mellitus)     Past Surgical History  Procedure Laterality Date  . Cesarean section  2004, 2007  . Tonsillectomy  2000  . Abdominoplasty  2008  . Intrauterine device insertion  09/13/2009    Mirena  . Breast biopsy Right 10/15/2012    Procedure: BREAST BIOPSY;  Surgeon: Haywood Lasso, MD;  Location: [facility];  Service: General;  Laterality: Right;  removal right breast mass  . Breast surgery      Lift  . Gynecologic cryosurgery  age 8  . Dialation and cutterage      Family History  Problem Relation Age of Onset  . Cancer Maternal Grandfather     lymphoma  . Cancer Paternal Grandfather     liver  . Lung cancer Maternal Grandmother     and maternal uncle  . Cancer Maternal Uncle     Lung cancer  . Hyperlipidemia Other     History  Substance Use Topics  . Smoking status: Former Smoker    Quit date: 08/17/2012  . Smokeless  tobacco: Never Used  . Alcohol Use: Yes     Comment: occasionally    Allergies:  Allergies  Allergen Reactions  . Ampicillin Hives  . Erythromycin Nausea And Vomiting  . Other Hives    SAUERKRAUT  . Ciprofloxacin Hives and Rash    Prescriptions prior to admission  Medication Sig Dispense Refill Last Dose  . acetaminophen (TYLENOL) 500 MG tablet Take 1,000 mg by mouth every 6 (six) hours as needed for mild pain, moderate pain or headache.   Past Week at Unknown time  . Cholecalciferol (VITAMIN D PO) Take 2,000 Units by mouth every morning.    01/30/2015 at Unknown time  . glyBURIDE (DIABETA) 2.5 MG tablet Take 2.5 mg by mouth every morning. Gestational diabetes, taken in combination with the 5 mg tablet.   01/30/2015 at Unknown time  . glyBURIDE (DIABETA) 5 MG tablet Take 5 mg by mouth at bedtime. Gestational diabetes, used in combination with the 2.5 mg tablet   01/29/2015 at Unknown time  . loratadine (CLARITIN) 10 MG tablet Take 10 mg by mouth daily as needed for allergies.   Past Week at Unknown time  . omeprazole (PRILOSEC) 20 MG capsule Take 20  mg by mouth daily.   01/30/2015 at Unknown time  . oxymetazoline (AFRIN) 0.05 % nasal spray Place 2 sprays into both nostrils daily as needed for congestion.    01/30/2015 at Unknown time  . Prenatal Vit-Fe Fumarate-FA (PRENATAL VITAMIN PO) Take 1 tablet by mouth daily.    01/29/2015 at Unknown time  . ranitidine (ZANTAC) 150 MG tablet Take 150 mg by mouth daily as needed for heartburn.   Past Month at Unknown time  . sertraline (ZOLOFT) 50 MG tablet Take 50 mg by mouth daily. For post-partum depression     . Witch Hazel (PREPARATION H TOTABLES WIPES EX) Apply 1 application topically 4 (four) times daily. Use with bowel movement for hemorrhoids   01/30/2015 at Unknown time  . cyclobenzaprine (FLEXERIL) 10 MG tablet Take 1 tablet (10 mg total) by mouth 3 (three) times daily as needed for muscle spasms. (Patient not taking: Reported on 01/23/2015) 30 tablet 1  Not Taking at Unknown time  . meloxicam (MOBIC) 15 MG tablet Take 1 tablet (15 mg total) by mouth daily. (Patient not taking: Reported on 01/23/2015) 30 tablet 3 Completed Course at Unknown time    Review of Systems  Constitutional: Negative for fever.  Gastrointestinal: Negative for nausea, vomiting, abdominal pain, diarrhea and constipation.  Genitourinary: Negative for dysuria, urgency and frequency.   Physical Exam   Blood pressure 139/73, pulse 93, temperature 98.2 F (36.8 C), temperature source Oral, resp. rate 18, height 5\' 4"  (1.626 m), weight 97.07 kg (214 lb), last menstrual period 04/23/2014, SpO2 100 %.  Physical Exam  Nursing note and vitals reviewed. Constitutional: She is oriented to person, place, and time. She appears well-developed and well-nourished. No distress.  Cardiovascular: Normal rate.   Respiratory: Effort normal.  GI: Soft. There is no tenderness. There is no rebound.  Neurological: She is alert and oriented to person, place, and time.  Skin: Skin is warm and dry.  Psychiatric: She has a normal mood and affect.    FHT: 135, moderate with 15x15 accels, no decels Toco: rare UCs  MAU Course  Procedures  MDM 2325: D/W Dr. Ouida Sills, ok for dc home.   Assessment and Plan   1. Decreased fetal movement, third trimester, fetus 1   2. NST (non-stress test) reactive    DC home Comfort measures reviewed  3rd Trimester precautions  Labor precautions  Fetal kick counts RX: none  Return to MAU as needed FU with OB as planned  Follow-up Information    Follow up with Olga Millers, MD.   Specialty:  Obstetrics and Gynecology   Why:  As scheduled   Contact information:   [address] [place] [address] [phone]         Mathis Bud 01/30/2015, 10:51 PM

## 2015-02-06 ENCOUNTER — Encounter (HOSPITAL_COMMUNITY)
Admission: RE | Admit: 2015-02-06 | Discharge: 2015-02-06 | Disposition: A | Discharge status: Home / Self Care | Payer: BLUE CROSS/BLUE SHIELD | Source: Ambulatory Visit | Attending: Obstetrics | Admitting: Obstetrics

## 2015-02-06 ENCOUNTER — Encounter (HOSPITAL_COMMUNITY): Payer: Self-pay

## 2015-02-06 LAB — CBC
HCT: 34.2 % — ABNORMAL LOW (ref 36.0–46.0)
Hemoglobin: 11.1 g/dL — ABNORMAL LOW (ref 12.0–15.0)
MCH: 24.8 pg — ABNORMAL LOW (ref 26.0–34.0)
MCHC: 32.5 g/dL (ref 30.0–36.0)
MCV: 76.5 fL — ABNORMAL LOW (ref 78.0–100.0)
Platelets: 267 10*3/uL (ref 150–400)
RBC: 4.47 MIL/uL (ref 3.87–5.11)
RDW: 15.5 % (ref 11.5–15.5)
WBC: 10.5 10*3/uL (ref 4.0–10.5)

## 2015-02-06 LAB — TYPE AND SCREEN
ABO/RH(D): A POS
Antibody Screen: NEGATIVE

## 2015-02-06 LAB — BASIC METABOLIC PANEL
Anion gap: 5 (ref 5–15)
BUN: 12 mg/dL (ref 6–20)
CO2: 25 mmol/L (ref 22–32)
Calcium: 8.8 mg/dL — ABNORMAL LOW (ref 8.9–10.3)
Chloride: 107 mmol/L (ref 101–111)
Creatinine, Ser: 0.62 mg/dL (ref 0.44–1.00)
GFR calc Af Amer: >60 mL/min (ref >60)
GFR calc non Af Amer: >60 mL/min (ref >60)
Glucose, Bld: 89 mg/dL (ref 65–99)
Potassium: 4.2 mmol/L (ref 3.5–5.1)
Sodium: 137 mmol/L (ref 135–145)

## 2015-02-06 LAB — ABO/RH: ABO/RH(D): A POS

## 2015-02-06 LAB — RPR: RPR Ser Ql: NONREACTIVE

## 2015-02-06 MED ORDER — GENTAMICIN SULFATE 40 MG/ML IJ SOLN
INTRAVENOUS | Status: DC
  Filled 2015-02-06: qty 9

## 2015-02-06 NOTE — Patient Instructions (Addendum)
   Your procedure is scheduled on: June 14 AT 1200  Enter through the Main Entrance of [facility] at: [place] up the phone at the desk and dial [phone] and inform us of your arrival.  Please call this number if you have any problems the morning of surgery: [phone]  Remember: Do not eat food after midnight: June 13 (MONDAY) Do not drink clear liquids after: 8AM Take these medicines the morning of surgery with a SIP OF WATER: DO NOT TAKE GLYBURIDE DOS  Do not wear jewelry, make-up, or FINGER nail polish No metal in your hair or on your body. Do not wear lotions, powders, perfumes.  You may wear deodorant.  Do not bring valuables to the hospital. Contacts, dentures or bridgework may not be worn into surgery.  Leave suitcase in the car. After Surgery it may be brought to your room. For patients being admitted to the hospital, checkout time is 11:00am the [place] of discharge.    Patients discharged on the [place] of surgery will not be allowed to drive home.

## 2015-02-07 ENCOUNTER — Inpatient Hospital Stay (HOSPITAL_COMMUNITY): Payer: BLUE CROSS/BLUE SHIELD | Admitting: Anesthesiology

## 2015-02-07 ENCOUNTER — Encounter (HOSPITAL_COMMUNITY)
Admission: AD | Disposition: A | Discharge status: Home / Self Care | Payer: Self-pay | Source: Ambulatory Visit | Attending: Obstetrics

## 2015-02-07 ENCOUNTER — Encounter (HOSPITAL_COMMUNITY): Payer: Self-pay

## 2015-02-07 ENCOUNTER — Inpatient Hospital Stay (HOSPITAL_COMMUNITY)
Admission: AD | Admit: 2015-02-07 | Discharge: 2015-02-09 | DRG: 766 | Disposition: A | Discharge status: Home / Self Care | Payer: BLUE CROSS/BLUE SHIELD | Source: Ambulatory Visit | Attending: Obstetrics | Admitting: Obstetrics

## 2015-02-07 ENCOUNTER — Inpatient Hospital Stay (HOSPITAL_COMMUNITY): Admission: RE | Admit: 2015-02-07 | Payer: BLUE CROSS/BLUE SHIELD | Source: Ambulatory Visit | Admitting: Obstetrics

## 2015-02-07 DIAGNOSIS — O24420 Gestational diabetes mellitus in childbirth, diet controlled: Secondary | ICD-10-CM | POA: Diagnosis present

## 2015-02-07 DIAGNOSIS — O09523 Supervision of elderly multigravida, third trimester: Secondary | ICD-10-CM | POA: Diagnosis not present

## 2015-02-07 DIAGNOSIS — Z87891 Personal history of nicotine dependence: Secondary | ICD-10-CM | POA: Diagnosis not present

## 2015-02-07 DIAGNOSIS — Z880 Allergy status to penicillin: Secondary | ICD-10-CM | POA: Diagnosis not present

## 2015-02-07 DIAGNOSIS — Z87442 Personal history of urinary calculi: Secondary | ICD-10-CM

## 2015-02-07 DIAGNOSIS — Z87410 Personal history of cervical dysplasia: Secondary | ICD-10-CM

## 2015-02-07 DIAGNOSIS — Z3A39 39 weeks gestation of pregnancy: Secondary | ICD-10-CM | POA: Diagnosis present

## 2015-02-07 DIAGNOSIS — O3421 Maternal care for scar from previous cesarean delivery: Secondary | ICD-10-CM | POA: Diagnosis present

## 2015-02-07 DIAGNOSIS — Z98891 History of uterine scar from previous surgery: Secondary | ICD-10-CM

## 2015-02-07 LAB — GLUCOSE, CAPILLARY: Glucose-Capillary: 78 mg/dL (ref 65–99)

## 2015-02-07 SURGERY — Surgical Case
Anesthesia: Spinal | Site: Abdomen

## 2015-02-07 MED ORDER — SIMETHICONE 80 MG PO CHEW
80.0000 mg | CHEWABLE_TABLET | ORAL | Status: DC
Start: 2015-02-08 — End: 2015-02-09
  Administered 2015-02-07 – 2015-02-09 (×2): 80 mg via ORAL
  Filled 2015-02-07 (×2): qty 1

## 2015-02-07 MED ORDER — KETOROLAC TROMETHAMINE 30 MG/ML IJ SOLN: 30.0000 mg | Freq: Four times a day (QID) | INTRAMUSCULAR | Status: AC | PRN

## 2015-02-07 MED ORDER — DIPHENHYDRAMINE HCL 50 MG/ML IJ SOLN: 12.5000 mg | INTRAMUSCULAR | Status: DC | PRN

## 2015-02-07 MED ORDER — PANTOPRAZOLE SODIUM 40 MG PO TBEC
40.0000 mg | DELAYED_RELEASE_TABLET | Freq: Every day | ORAL | Status: DC
  Administered 2015-02-09: 40 mg via ORAL
  Filled 2015-02-07 (×2): qty 1

## 2015-02-07 MED ORDER — MORPHINE SULFATE (PF) 0.5 MG/ML IJ SOLN
INTRAMUSCULAR | Status: DC | PRN
  Administered 2015-02-07: .2 mg via EPIDURAL

## 2015-02-07 MED ORDER — LACTATED RINGERS IV SOLN
INTRAVENOUS | Status: DC
  Administered 2015-02-07: 20:00:00 via INTRAVENOUS

## 2015-02-07 MED ORDER — MENTHOL 3 MG MT LOZG: 1.0000 | LOZENGE | OROMUCOSAL | Status: DC | PRN

## 2015-02-07 MED ORDER — ACETAMINOPHEN 325 MG PO TABS
650.0000 mg | ORAL_TABLET | ORAL | Status: DC | PRN
  Administered 2015-02-08 – 2015-02-09 (×3): 650 mg via ORAL
  Filled 2015-02-07 (×4): qty 2

## 2015-02-07 MED ORDER — ONDANSETRON HCL 4 MG/2ML IJ SOLN
4.0000 mg | Freq: Once | INTRAMUSCULAR | Status: AC | PRN
  Filled 2015-02-07: qty 2

## 2015-02-07 MED ORDER — DEXTROSE 5 % IV SOLN
INTRAVENOUS | Status: DC | PRN
  Administered 2015-02-07: 100 mL via INTRAVENOUS

## 2015-02-07 MED ORDER — FENTANYL CITRATE (PF) 100 MCG/2ML IJ SOLN: 25.0000 ug | INTRAMUSCULAR | Status: DC | PRN

## 2015-02-07 MED ORDER — LANOLIN HYDROUS EX OINT: 1.0000 "application " | TOPICAL_OINTMENT | CUTANEOUS | Status: DC | PRN

## 2015-02-07 MED ORDER — PRENATAL MULTIVITAMIN CH
1.0000 | ORAL_TABLET | Freq: Every day | ORAL | Status: DC
  Administered 2015-02-08: 1 via ORAL
  Filled 2015-02-07 (×3): qty 1

## 2015-02-07 MED ORDER — NALOXONE HCL 0.4 MG/ML IJ SOLN: 0.4000 mg | INTRAMUSCULAR | Status: DC | PRN

## 2015-02-07 MED ORDER — KETOROLAC TROMETHAMINE 30 MG/ML IJ SOLN
30.0000 mg | Freq: Four times a day (QID) | INTRAMUSCULAR | Status: AC | PRN
  Administered 2015-02-07: 30 mg via INTRAVENOUS
  Filled 2015-02-07: qty 1

## 2015-02-07 MED ORDER — NALBUPHINE HCL 10 MG/ML IJ SOLN: 5.0000 mg | Freq: Once | INTRAMUSCULAR | Status: AC | PRN

## 2015-02-07 MED ORDER — PHENYLEPHRINE 8 MG IN D5W 100 ML (0.08MG/ML) PREMIX OPTIME
INJECTION | INTRAVENOUS | Status: DC | PRN
  Administered 2015-02-07: 60 ug/min via INTRAVENOUS

## 2015-02-07 MED ORDER — FENTANYL CITRATE (PF) 100 MCG/2ML IJ SOLN
INTRAMUSCULAR | Status: DC | PRN
  Administered 2015-02-07: 10 ug via INTRAVENOUS

## 2015-02-07 MED ORDER — SCOPOLAMINE 1 MG/3DAYS TD PT72
MEDICATED_PATCH | TRANSDERMAL | Status: AC
  Administered 2015-02-07: 1.5 mg via TRANSDERMAL
  Filled 2015-02-07: qty 1

## 2015-02-07 MED ORDER — NALOXONE HCL 1 MG/ML IJ SOLN: 1.0000 ug/kg/h | INTRAMUSCULAR | Status: DC | PRN

## 2015-02-07 MED ORDER — LACTATED RINGERS IV SOLN
INTRAVENOUS | Status: DC | PRN
  Administered 2015-02-07: 12:00:00 via INTRAVENOUS

## 2015-02-07 MED ORDER — SIMETHICONE 80 MG PO CHEW
80.0000 mg | CHEWABLE_TABLET | Freq: Three times a day (TID) | ORAL | Status: DC
  Administered 2015-02-07 – 2015-02-09 (×4): 80 mg via ORAL
  Filled 2015-02-07 (×5): qty 1

## 2015-02-07 MED ORDER — OXYTOCIN 40 UNITS IN LACTATED RINGERS INFUSION - SIMPLE MED: 62.5000 mL/h | INTRAVENOUS | Status: AC

## 2015-02-07 MED ORDER — OXYCODONE-ACETAMINOPHEN 5-325 MG PO TABS: 2.0000 | ORAL_TABLET | ORAL | Status: DC | PRN

## 2015-02-07 MED ORDER — SENNOSIDES-DOCUSATE SODIUM 8.6-50 MG PO TABS
2.0000 | ORAL_TABLET | ORAL | Status: DC
Start: 2015-02-08 — End: 2015-02-09
  Administered 2015-02-07 – 2015-02-09 (×2): 2 via ORAL
  Filled 2015-02-07 (×2): qty 2

## 2015-02-07 MED ORDER — TETANUS-DIPHTH-ACELL PERTUSSIS 5-2.5-18.5 LF-MCG/0.5 IM SUSP: 0.5000 mL | Freq: Once | INTRAMUSCULAR | Status: DC

## 2015-02-07 MED ORDER — IBUPROFEN 600 MG PO TABS
600.0000 mg | ORAL_TABLET | Freq: Four times a day (QID) | ORAL | Status: DC
  Administered 2015-02-07 – 2015-02-09 (×6): 600 mg via ORAL
  Filled 2015-02-07 (×7): qty 1

## 2015-02-07 MED ORDER — NALBUPHINE HCL 10 MG/ML IJ SOLN
5.0000 mg | INTRAMUSCULAR | Status: DC | PRN
  Administered 2015-02-07 (×2): 5 mg via INTRAVENOUS
  Filled 2015-02-07 (×2): qty 1

## 2015-02-07 MED ORDER — OXYTOCIN 10 UNIT/ML IJ SOLN
40.0000 [IU] | INTRAVENOUS | Status: DC | PRN
  Administered 2015-02-07: 40 [IU] via INTRAVENOUS

## 2015-02-07 MED ORDER — SERTRALINE HCL 50 MG PO TABS
50.0000 mg | ORAL_TABLET | Freq: Every day | ORAL | Status: DC
  Filled 2015-02-07 (×3): qty 1

## 2015-02-07 MED ORDER — OXYCODONE-ACETAMINOPHEN 5-325 MG PO TABS: 1.0000 | ORAL_TABLET | ORAL | Status: DC | PRN

## 2015-02-07 MED ORDER — MEPERIDINE HCL 25 MG/ML IJ SOLN: 6.2500 mg | INTRAMUSCULAR | Status: DC | PRN

## 2015-02-07 MED ORDER — DIPHENHYDRAMINE HCL 25 MG PO CAPS: 25.0000 mg | ORAL_CAPSULE | Freq: Four times a day (QID) | ORAL | Status: DC | PRN

## 2015-02-07 MED ORDER — SCOPOLAMINE 1 MG/3DAYS TD PT72: 1.0000 | MEDICATED_PATCH | Freq: Once | TRANSDERMAL | Status: DC

## 2015-02-07 MED ORDER — NALBUPHINE HCL 10 MG/ML IJ SOLN
INTRAMUSCULAR | Status: AC
  Administered 2015-02-07: 5 mg via INTRAVENOUS
  Filled 2015-02-07: qty 1

## 2015-02-07 MED ORDER — LACTATED RINGERS IV SOLN
INTRAVENOUS | Status: DC
  Administered 2015-02-07 (×2): via INTRAVENOUS

## 2015-02-07 MED ORDER — ONDANSETRON HCL 4 MG/2ML IJ SOLN: 4.0000 mg | Freq: Three times a day (TID) | INTRAMUSCULAR | Status: DC | PRN

## 2015-02-07 MED ORDER — NALBUPHINE HCL 10 MG/ML IJ SOLN
5.0000 mg | INTRAMUSCULAR | Status: DC | PRN
  Administered 2015-02-07: 5 mg via SUBCUTANEOUS

## 2015-02-07 MED ORDER — BUPIVACAINE IN DEXTROSE 0.75-8.25 % IT SOLN
INTRATHECAL | Status: DC | PRN
  Administered 2015-02-07: 1.8 mg via INTRATHECAL

## 2015-02-07 MED ORDER — ONDANSETRON HCL 4 MG/2ML IJ SOLN
INTRAMUSCULAR | Status: DC | PRN
  Administered 2015-02-07: 4 mg via INTRAVENOUS

## 2015-02-07 MED ORDER — DIBUCAINE 1 % RE OINT: 1.0000 "application " | TOPICAL_OINTMENT | RECTAL | Status: DC | PRN

## 2015-02-07 MED ORDER — WITCH HAZEL-GLYCERIN EX PADS: 1.0000 "application " | MEDICATED_PAD | CUTANEOUS | Status: DC | PRN

## 2015-02-07 MED ORDER — DIPHENHYDRAMINE HCL 25 MG PO CAPS
25.0000 mg | ORAL_CAPSULE | ORAL | Status: DC | PRN
  Administered 2015-02-08: 25 mg via ORAL
  Filled 2015-02-07: qty 1

## 2015-02-07 MED ORDER — SCOPOLAMINE 1 MG/3DAYS TD PT72
1.0000 | MEDICATED_PATCH | Freq: Once | TRANSDERMAL | Status: DC
  Administered 2015-02-07: 1.5 mg via TRANSDERMAL
  Filled 2015-02-07: qty 1

## 2015-02-07 MED ORDER — SODIUM CHLORIDE 0.9 % IJ SOLN: 3.0000 mL | INTRAMUSCULAR | Status: DC | PRN

## 2015-02-07 MED ORDER — SIMETHICONE 80 MG PO CHEW: 80.0000 mg | CHEWABLE_TABLET | ORAL | Status: DC | PRN

## 2015-02-07 SURGICAL SUPPLY — 34 items
APL SKNCLS STERI-STRIP NONHPOA (GAUZE/BANDAGES/DRESSINGS) ×1
BENZOIN TINCTURE PRP APPL 2/3 (GAUZE/BANDAGES/DRESSINGS) ×2 IMPLANT
CLAMP CORD UMBIL (MISCELLANEOUS) IMPLANT
CLOTH BEACON ORANGE TIMEOUT ST (SAFETY) ×2 IMPLANT
DRAPE SHEET LG 3/4 BI-LAMINATE (DRAPES) IMPLANT
DRSG OPSITE POSTOP 4X10 (GAUZE/BANDAGES/DRESSINGS) ×2 IMPLANT
DURAPREP 26ML APPLICATOR (WOUND CARE) ×2 IMPLANT
ELECT REM PT RETURN 9FT ADLT (ELECTROSURGICAL) ×2
ELECTRODE REM PT RTRN 9FT ADLT (ELECTROSURGICAL) ×1 IMPLANT
EXTRACTOR VACUUM KIWI (MISCELLANEOUS) IMPLANT
GLOVE BIO SURGEON STRL SZ 6 (GLOVE) ×2 IMPLANT
GLOVE INDICATOR 6.5 STRL GRN (GLOVE) ×2 IMPLANT
GOWN STRL REUS W/TWL LRG LVL3 (GOWN DISPOSABLE) ×4 IMPLANT
KIT ABG SYR 3ML LUER SLIP (SYRINGE) IMPLANT
LIQUID BAND (GAUZE/BANDAGES/DRESSINGS) IMPLANT
NDL HYPO 25X5/8 SAFETYGLIDE (NEEDLE) IMPLANT
NEEDLE HYPO 25X5/8 SAFETYGLIDE (NEEDLE) IMPLANT
NS IRRIG 1000ML POUR BTL (IV SOLUTION) ×2 IMPLANT
PACK C SECTION WH (CUSTOM PROCEDURE TRAY) ×2 IMPLANT
PAD OB MATERNITY 4.3X12.25 (PERSONAL CARE ITEMS) ×2 IMPLANT
RTRCTR C-SECT PINK 25CM LRG (MISCELLANEOUS) ×1 IMPLANT
STRIP CLOSURE SKIN 1/2X4 (GAUZE/BANDAGES/DRESSINGS) ×2 IMPLANT
SUT MNCRL 0 VIOLET CTX 36 (SUTURE) ×2 IMPLANT
SUT MNCRL AB 3-0 PS2 27 (SUTURE) ×2 IMPLANT
SUT MONOCRYL 0 CTX 36 (SUTURE) ×2
SUT PLAIN 0 NONE (SUTURE) IMPLANT
SUT PLAIN 2 0 (SUTURE) ×2
SUT PLAIN ABS 2-0 CT1 27XMFL (SUTURE) ×1 IMPLANT
SUT VIC AB 0 CTX 36 (SUTURE) ×4
SUT VIC AB 0 CTX36XBRD ANBCTRL (SUTURE) ×2 IMPLANT
SUT VIC AB 2-0 CT1 27 (SUTURE) ×2
SUT VIC AB 2-0 CT1 TAPERPNT 27 (SUTURE) ×1 IMPLANT
TOWEL OR 17X24 6PK STRL BLUE (TOWEL DISPOSABLE) ×2 IMPLANT
TRAY FOLEY CATH SILVER 14FR (SET/KITS/TRAYS/PACK) ×2 IMPLANT

## 2015-02-07 NOTE — MAU Note (Signed)
Notified Dr. Rogue Bussing patient G4P2 scheduled C/S for 12:00 by Dr. Carlis Abbott, cervix closed/thick/posterior, uterine contractions 3 to 4 minutes, no bleeding, no LOF, per Dr. Rogue Bussing can be discharged and keep her 12:00 appointment.

## 2015-02-07 NOTE — H&P (Signed)
44 y.o. [id number] @ [redacted]w[redacted]d presents for RCS. History of prior c/s x 2. She underwent an abdominoplasty following her most recent c/s. Pregnancy has been complicated by GDMA1. Otherwise has good fetal movement and no bleeding.  She c/o ctx since 0530 this AM.  Was seen in MAU 2 hrs ago and cvx was closed.  Ctx are spacing.   Past Medical History  Diagnosis Date  . Heart murmur     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  . Arthritis     hands  . Anxiety   . Breast mass, right 09/2012  . PONV (postoperative nausea and vomiting)     hx. post-op nausea  . Sinus congestion 10/12/2012  . History of kidney stones 10/2011  . Vitiligo   . Cervical dysplasia age 83  . Vitamin D deficiency 10/2013    Value 29  . GDM (gestational diabetes mellitus)     Past Surgical History  Procedure Laterality Date  . Cesarean section  2004, 2007  . Tonsillectomy  2000  . Abdominoplasty  2008  . Intrauterine device insertion  09/13/2009    Mirena  . Breast biopsy Right 10/15/2012    Procedure: BREAST BIOPSY;  Surgeon: Haywood Lasso, MD;  Location: [facility];  Service: General;  Laterality: Right;  removal right breast mass  . Breast surgery      Lift  . Gynecologic cryosurgery  age 21  . Dialation and cutterage      OB History  Gravida Para Term Preterm AB SAB TAB Ectopic Multiple Living  4 2 2  1 1    2     # Outcome Date GA Lbr Len/2nd Weight Sex Delivery Anes PTL Lv  4 Current           3 SAB           2 Term           1 Term               History   Social History  . Marital Status: Married    Spouse Name: N/A  . Number of Children: N/A  . Years of Education: N/A   Occupational History  . Not on file.   Social History Main Topics  . Smoking status: Former Smoker    Quit date: 08/17/2012  . Smokeless tobacco: Never Used  . Alcohol Use: Yes     Comment: occasionally  . Drug Use: No  . Sexual Activity:    Partners: Male     Comment: Mirena inserted 09-13-09    Other Topics Concern  . Not on file   Social History Narrative   Ampicillin; Erythromycin; Other; and Ciprofloxacin    Prenatal Transfer Tool  Maternal Diabetes: Yes:  Diabetes Type:  Diet controlled Genetic Screening: Normal Maternal Ultrasounds/Referrals: Normal Fetal Ultrasounds or other Referrals:  None Maternal Substance Abuse:  No Significant Maternal Medications:  None Significant Maternal Lab Results: Lab values include: Group B Strep negative  ABO, Rh: --/--/A POS, A POS (06/13 0840) Antibody: NEG (06/13 0840) Rubella:  Immune RPR: Non Reactive (06/13 0840)  HBsAg: Negative (12/04 0000)  HIV: Non-reactive (12/04 0000)  GBS: Negative (05/18 0000)    Other PNC: GDMA1.    Filed Vitals:   02/07/15 1129  BP: 127/70  Pulse: 83  Temp: 98.8 F (37.1 C)  Resp: 16     General:  NAD Ex:  trace edema SVE:  Closed per RN in MAU FHTs:  140s,    A/P   44 y.o. [redacted]w[redacted]d  [id number] presents for RCS at term. Plan for Mirena IUD after 6 wks for contraception GDMA1: BG prior to surgery 78  Discussed risks of cesarean delivery to include: infection, bleeding, damage to surrounding structures, including bowel, bladder, tubes, ovaries, nerves, vessels, baby, need for additional surgical procedures, blood clot, need for blood transfusion. All questions answered. Consent signed.  Severe PCN allergy: clinda/gent perio-operatively  [place]

## 2015-02-07 NOTE — Anesthesia Postprocedure Evaluation (Signed)
  Anesthesia Post-op Note  Patient: Mackenzie Meyers  Procedure(s) Performed: Procedure(s) (LRB): CESAREAN SECTION (N/A)  Patient Location: PACU  Anesthesia Type: Spinal  Level of Consciousness: awake and alert   Airway and Oxygen Therapy: Patient Spontanous Breathing  Post-op Pain: mild  Post-op Assessment: Post-op Vital signs reviewed, Patient's Cardiovascular Status Stable, Respiratory Function Stable, Patent Airway and No signs of Nausea or vomiting  Last Vitals:  Filed Vitals:   02/07/15 1442  BP:   Pulse: 75  Temp: 36.4 C  Resp: 23    Post-op Vital Signs: stable   Complications: No apparent anesthesia complications

## 2015-02-07 NOTE — Discharge Instructions (Signed)
Fetal Movement Counts °Patient Name: __________________________________________________ Patient Due Date: ____________________ °Performing a fetal movement count is highly recommended in high-risk pregnancies, but it is good for every pregnant woman to do. Your health care provider may ask you to start counting fetal movements at 28 weeks of the pregnancy. Fetal movements often increase: °· After eating a full meal. °· After physical activity. °· After eating or drinking something sweet or cold. °· At rest. °Pay attention to when you feel the baby is most active. This will help you notice a pattern of your baby's sleep and wake cycles and what factors contribute to an increase in fetal movement. It is important to perform a fetal movement count at the same time each Caul when your baby is normally most active.  °HOW TO COUNT FETAL MOVEMENTS °1. Find a quiet and comfortable area to sit or lie down on your left side. Lying on your left side provides the best blood and oxygen circulation to your baby. °2. Write down the Barkey and time on a sheet of paper or in a journal. °3. Start counting kicks, flutters, swishes, rolls, or jabs in a 2-hour period. You should feel at least 10 movements within 2 hours. °4. If you do not feel 10 movements in 2 hours, wait 2-3 hours and count again. Look for a change in the pattern or not enough counts in 2 hours. °SEEK MEDICAL CARE IF: °· You feel less than 10 counts in 2 hours, tried twice. °· There is no movement in over an hour. °· The pattern is changing or taking longer each Logiudice to reach 10 counts in 2 hours. °· You feel the baby is not moving as he or she usually does. °Date: ____________ Movements: ____________ Start time: ____________ Finish time: ____________  °Date: ____________ Movements: ____________ Start time: ____________ Finish time: ____________ °Date: ____________ Movements: ____________ Start time: ____________ Finish time: ____________ °Date: ____________ Movements:  ____________ Start time: ____________ Finish time: ____________ °Date: ____________ Movements: ____________ Start time: ____________ Finish time: ____________ °Date: ____________ Movements: ____________ Start time: ____________ Finish time: ____________ °Date: ____________ Movements: ____________ Start time: ____________ Finish time: ____________ °Date: ____________ Movements: ____________ Start time: ____________ Finish time: ____________  °Date: ____________ Movements: ____________ Start time: ____________ Finish time: ____________ °Date: ____________ Movements: ____________ Start time: ____________ Finish time: ____________ °Date: ____________ Movements: ____________ Start time: ____________ Finish time: ____________ °Date: ____________ Movements: ____________ Start time: ____________ Finish time: ____________ °Date: ____________ Movements: ____________ Start time: ____________ Finish time: ____________ °Date: ____________ Movements: ____________ Start time: ____________ Finish time: ____________ °Date: ____________ Movements: ____________ Start time: ____________ Finish time: ____________  °Date: ____________ Movements: ____________ Start time: ____________ Finish time: ____________ °Date: ____________ Movements: ____________ Start time: ____________ Finish time: ____________ °Date: ____________ Movements: ____________ Start time: ____________ Finish time: ____________ °Date: ____________ Movements: ____________ Start time: ____________ Finish time: ____________ °Date: ____________ Movements: ____________ Start time: ____________ Finish time: ____________ °Date: ____________ Movements: ____________ Start time: ____________ Finish time: ____________ °Date: ____________ Movements: ____________ Start time: ____________ Finish time: ____________  °Date: ____________ Movements: ____________ Start time: ____________ Finish time: ____________ °Date: ____________ Movements: ____________ Start time: ____________ Finish  time: ____________ °Date: ____________ Movements: ____________ Start time: ____________ Finish time: ____________ °Date: ____________ Movements: ____________ Start time: ____________ Finish time: ____________ °Date: ____________ Movements: ____________ Start time: ____________ Finish time: ____________ °Date: ____________ Movements: ____________ Start time: ____________ Finish time: ____________ °Date: ____________ Movements: ____________ Start time: ____________ Finish time: ____________  °Date: ____________ Movements: ____________ Start time: ____________ Finish   time: ____________ °Date: ____________ Movements: ____________ Start time: ____________ Finish time: ____________ °Date: ____________ Movements: ____________ Start time: ____________ Finish time: ____________ °Date: ____________ Movements: ____________ Start time: ____________ Finish time: ____________ °Date: ____________ Movements: ____________ Start time: ____________ Finish time: ____________ °Date: ____________ Movements: ____________ Start time: ____________ Finish time: ____________ °Date: ____________ Movements: ____________ Start time: ____________ Finish time: ____________  °Date: ____________ Movements: ____________ Start time: ____________ Finish time: ____________ °Date: ____________ Movements: ____________ Start time: ____________ Finish time: ____________ °Date: ____________ Movements: ____________ Start time: ____________ Finish time: ____________ °Date: ____________ Movements: ____________ Start time: ____________ Finish time: ____________ °Date: ____________ Movements: ____________ Start time: ____________ Finish time: ____________ °Date: ____________ Movements: ____________ Start time: ____________ Finish time: ____________ °Date: ____________ Movements: ____________ Start time: ____________ Finish time: ____________  °Date: ____________ Movements: ____________ Start time: ____________ Finish time: ____________ °Date: ____________  Movements: ____________ Start time: ____________ Finish time: ____________ °Date: ____________ Movements: ____________ Start time: ____________ Finish time: ____________ °Date: ____________ Movements: ____________ Start time: ____________ Finish time: ____________ °Date: ____________ Movements: ____________ Start time: ____________ Finish time: ____________ °Date: ____________ Movements: ____________ Start time: ____________ Finish time: ____________ °Date: ____________ Movements: ____________ Start time: ____________ Finish time: ____________  °Date: ____________ Movements: ____________ Start time: ____________ Finish time: ____________ °Date: ____________ Movements: ____________ Start time: ____________ Finish time: ____________ °Date: ____________ Movements: ____________ Start time: ____________ Finish time: ____________ °Date: ____________ Movements: ____________ Start time: ____________ Finish time: ____________ °Date: ____________ Movements: ____________ Start time: ____________ Finish time: ____________ °Date: ____________ Movements: ____________ Start time: ____________ Finish time: ____________ °Document Released: 09/11/2006 Document Revised: 12/27/2013 Document Reviewed: 06/08/2012 °[employer]® Patient Information ©2015 [employer]. This information is not intended to replace advice given to you by your health care provider. Make sure you discuss any questions you have with your health care provider. ° °

## 2015-02-07 NOTE — MAU Note (Signed)
Urine in lab 

## 2015-02-07 NOTE — Consult Note (Signed)
Neonatology Note:   Attendance at C-section:    I was asked by Dr. Carlis Abbott to attend this repeat C/S at term. The mother is a G4P2A1 A pos, GBS neg with diet-controlled GDM. ROM at delivery, fluid clear. Infant vigorous with good spontaneous cry and tone. Needed only minimal bulb suctioning. Ap 9/9. Lungs clear to ausc in DR. To CN to care of Pediatrician.   Real Cons, MD

## 2015-02-07 NOTE — MAU Note (Signed)
Woke up with contractions at 0600. Is scheduled for c/s at noon today.  No bleeding or leaking.

## 2015-02-07 NOTE — Op Note (Signed)
Cesarean Section Procedure Note  Pre-operative Diagnosis: 1. Intrauterine pregnancy at [redacted]w[redacted]d  2. Prior cesarean section x 2  3. Gestational diabetes, diet controlled  Post-operative Diagnosis: same as above  Surgeon: Jerelyn Charles, MD  Assistants: Alden Hipp, MD  Procedure: Repeat low transverse cesarean section   Anesthesia: Spinal anesthesia  Estimated Blood Loss: 800 mL         Drains: Foley catheter         Specimens: placenta to L&D         Implants: none         Complications:  None; patient tolerated the procedure well.         Disposition: PACU - hemodynamically stable.  Findings:  Normal uterus, tubes and ovaries bilaterally.  Viable female infant, weight pending,  Apgars 9, 9.    Procedure Details   After epidural anesthesia was found to adequate , the patient was placed in the dorsal supine position with a leftward tilt, draped and prepped in the usual sterile manner.  She had an abdominoplasty with excision of prior cesarean scar after her last cesarean section.  This incision was approximately 5 cm above the pubic symphysis.   A new Pfannenstiel incision was made and carried down through the subcutaneous tissue to the fascia. The fascia was incised in the midline and the fascial incision was extended laterally with Mayo scissors. The superior aspect of the fascial incision was grasped with two Kocher clamp, tented up and the rectus muscles dissected off sharply. The rectus was then dissected off with blunt dissection and Mayo scissors inferiorly. The rectus muscles were separated in the midline. The abdominal peritoneum was identified, tented up, entered sharply, and the incision was extended superiorly and inferiorly with good visualization of the bladder.  A small amount of omentum was adhered to the right side of the peritoneum.  The abdominal cavity was otherwise free of adhesions. The Alexis retractor was deployed. The vesicouterine peritoneum was identified,  tented up, entered sharply, and the bladder flap was created digitally. Scalpel was then used to make a low transverse incision on the uterus which was extended in the cephalad-caudad direction with blunt dissection. The fluid was clear. The fetal vertex was identified, elevated out of the pelvis and brought to the hysterotomy. The head was delivered easily followed by the shoulders and body. The cord was clamped and cut and the infant was passed to the waiting neonatologist. Placenta was then delivered spontaneously, intact and appear normal, the uterus was cleared of all clot and debris   The hysterotomy was repaired with #0 Monocryl in running locked fashion.   The hysterotomy was reexamined and excellent hemostasis was noted.  The Alexis retractor was removed from the abdomen. The peritoneum was examined and all vessels noted to be hemostatic. The abdominal cavity was cleared of all clot and debris.  The peritoneum was closed with 2-0 vicryl in a running fashion.  The rectus muscles were closed with 2-0 vicryl in a running fashion.The fascia and rectus muscles were inspected and were hemostatic. The fascia was closed with 0 Vicryl in a running fashion. The subcuticular layer was irrigated and all bleeders cauterized.  The subcutaneous layer was re approximated with interrupted 3-0 plain gut.  The skin was closed with 3-0 monocryl in a subcuticular fashion. The incision was dressed with benzoine, steri strips and pressure dressing. All sponge lap and needle counts were correct x3. Patient tolerated the procedure well and recovered in stable condition following the  procedure.

## 2015-02-07 NOTE — Addendum Note (Signed)
Addendum  created 02/07/15 2121 by Flossie Dibble, CRNA   Modules edited: Notes Section   Notes Section:  File: [id number]

## 2015-02-07 NOTE — Transfer of Care (Signed)
Immediate Anesthesia Transfer of Care Note  Patient: Mackenzie Meyers  Procedure(s) Performed: Procedure(s) with comments: CESAREAN SECTION (N/A) - EDD: 02/13/15   Patient Location: PACU  Anesthesia Type:Regional  Level of Consciousness: awake  Airway & Oxygen Therapy: Patient Spontanous Breathing  Post-op Assessment: Report given to RN  Post vital signs: Reviewed and stable  Last Vitals:  Filed Vitals:   02/07/15 1129  BP: 127/70  Pulse: 83  Temp: 37.1 C  Resp: 16    Complications: No apparent anesthesia complications

## 2015-02-07 NOTE — Anesthesia Postprocedure Evaluation (Signed)
Anesthesia Post Note  Patient: Mackenzie Meyers  Procedure(s) Performed: Procedure(s) (LRB): CESAREAN SECTION (N/A)  Anesthesia type: Spinal  Patient location: Mother/Baby  Post pain: Pain level controlled  Post assessment: Post-op Vital signs reviewed  Last Vitals:  Filed Vitals:   02/07/15 2048  BP: 104/50  Pulse: 89  Temp:   Resp:     Post vital signs: Reviewed  Level of consciousness: awake  Complications: No apparent anesthesia complications

## 2015-02-07 NOTE — MAU Note (Signed)
Contractions since 445 am today.

## 2015-02-07 NOTE — Anesthesia Procedure Notes (Signed)
Spinal Patient location during procedure: OR Staffing Anesthesiologist: JUDD, MARY Performed by: anesthesiologist  Preanesthetic Checklist Completed: patient identified, site marked, surgical consent, pre-op evaluation, timeout performed, IV checked, risks and benefits discussed and monitors and equipment checked Spinal Block Patient position: sitting Prep: DuraPrep Patient monitoring: heart rate, continuous pulse ox and blood pressure Location: L3-4 Injection technique: single-shot Needle Needle type: Sprotte  Needle gauge: 24 G Needle length: 9 cm Assessment Sensory level: T4 Additional Notes Expiration date of kit checked and confirmed. Patient tolerated procedure well, without complications. Functioning IV was confirmed and monitors were applied. Sterile prep and drape, including hand hygiene, mask and sterile gloves were used. The patient was positioned and the spine was prepped. The skin was anesthetized with lidocaine.  Free flow of clear CSF was obtained prior to injecting local anesthetic into the CSF.  The spinal needle aspirated freely following injection.  The needle was carefully withdrawn.  The patient tolerated the procedure well. Consent was obtained prior to procedure with all questions answered and concerns addressed. Risks including but not limited to bleeding, infection, nerve damage, paralysis, failed block, inadequate analgesia, allergic reaction, high spinal, itching and headache were discussed and the patient wished to proceed.   Mary Judd, MD     

## 2015-02-07 NOTE — Anesthesia Preprocedure Evaluation (Addendum)
Anesthesia Evaluation  Patient identified by MRN, date of birth, ID band Patient awake    Reviewed: Allergy & Precautions, NPO status , Patient's Chart, lab work & pertinent test results  History of Anesthesia Complications (+) PONV and history of anesthetic complications  Airway Mallampati: II  TM Distance: >3 FB Neck ROM: Full    Dental no notable dental hx. (+) Dental Advisory Given   Pulmonary former smoker,  breath sounds clear to auscultation  Pulmonary exam normal       Cardiovascular Normal cardiovascular exam+ Valvular Problems/Murmurs Rhythm:Regular Rate:Normal     Neuro/Psych PSYCHIATRIC DISORDERS Anxiety negative neurological ROS     GI/Hepatic negative GI ROS, Neg liver ROS,   Endo/Other  diabetes, Gestational, Oral Hypoglycemic Agents  Renal/GU negative Renal ROS  negative genitourinary   Musculoskeletal  (+) Arthritis -,   Abdominal   Peds negative pediatric ROS (+)  Hematology negative hematology ROS (+)   Anesthesia Other Findings   Reproductive/Obstetrics (+) Pregnancy                            Anesthesia Physical Anesthesia Plan  ASA: II  Anesthesia Plan: Spinal   Post-op Pain Management:    Induction:   Airway Management Planned:   Additional Equipment:   Intra-op Plan:   Post-operative Plan:   Informed Consent: I have reviewed the patients History and Physical, chart, labs and discussed the procedure including the risks, benefits and alternatives for the proposed anesthesia with the patient or authorized representative who has indicated his/her understanding and acceptance.   Dental advisory given  Plan Discussed with: CRNA  Anesthesia Plan Comments:         Anesthesia Quick Evaluation

## 2015-02-08 ENCOUNTER — Encounter (HOSPITAL_COMMUNITY): Payer: Self-pay | Admitting: Obstetrics

## 2015-02-08 LAB — CBC
HCT: 29.1 % — ABNORMAL LOW (ref 36.0–46.0)
Hemoglobin: 9.2 g/dL — ABNORMAL LOW (ref 12.0–15.0)
MCH: 24.3 pg — ABNORMAL LOW (ref 26.0–34.0)
MCHC: 31.6 g/dL (ref 30.0–36.0)
MCV: 76.8 fL — ABNORMAL LOW (ref 78.0–100.0)
Platelets: 230 10*3/uL (ref 150–400)
RBC: 3.79 MIL/uL — ABNORMAL LOW (ref 3.87–5.11)
RDW: 15.7 % — ABNORMAL HIGH (ref 11.5–15.5)
WBC: 12.5 10*3/uL — ABNORMAL HIGH (ref 4.0–10.5)

## 2015-02-08 LAB — GLUCOSE, CAPILLARY
Glucose-Capillary: 89 mg/dL (ref 65–99)
Glucose-Capillary: 113 mg/dL — ABNORMAL HIGH (ref 65–99)

## 2015-02-08 NOTE — Lactation Note (Signed)
This note was copied from the chart of Mackenzie Rosezella Spadafore. Lactation Consultation Note  Initial visit made at 40 hours old.  Mom has a history of a breast reduction and lift 7 years ago and removal of scar tissue from right breast one year ago.  She states her first baby would not latch and 2nd she could not get into a comfortable position.  Breast surgery was done after last baby.  Discussed with mom the possibility of low milk supply and the need for close follow up weight checks.  Mom can easily hand express colostrum.  Assisted with positioning baby in the cradle hold.  She states she does not like the football hold.  Baby latched well after a few attempts and nursed actively.  Waking techniques and breast massage encouraged.  Answered questions.  Instructed to call for assist/concerns prn.  Patient Name: Mackenzie Meyers Today's Date: 02/08/2015 Reason for consult: Initial assessment;Breast surgery   Maternal Data    Feeding Feeding Type: Breast Fed Length of feed: 15 min  LATCH Score/Interventions Latch: Grasps breast easily, tongue down, lips flanged, rhythmical sucking.  Audible Swallowing: A few with stimulation Intervention(s): Skin to skin;Hand expression Intervention(s): Skin to skin;Hand expression;Alternate breast massage  Type of Nipple: Everted at rest and after stimulation  Comfort (Breast/Nipple): Soft / non-tender     Hold (Positioning): Assistance needed to correctly position infant at breast and maintain latch. Intervention(s): Breastfeeding basics reviewed;Support Pillows;Position options;Skin to skin  LATCH Score: 8  Lactation Tools Discussed/Used     Consult Status      Ave Filter 02/08/2015, 1:56 PM

## 2015-02-08 NOTE — Progress Notes (Signed)
Subjective: Postpartum [place] 1: Cesarean Delivery Patient reports pain controlled, no nausea or vomiting    Objective: Vital signs in last 24 hours: Temp:  [97.5 F (36.4 C)-98.4 F (36.9 C)] 98.2 F (36.8 C) (06/15 0649) Pulse Rate:  [59-89] 59 (06/15 0649) Resp:  [16-46] 18 (06/15 0649) BP: (102-124)/(45-63) 121/48 mmHg (06/15 0649) SpO2:  [96 %-100 %] 97 % (06/15 0204)  Physical Exam:  General: alert, cooperative and appears stated age Lochia: appropriate Uterine Fundus: firm Incision: healing well DVT Evaluation: No evidence of DVT seen on physical exam.   Recent Labs  02/06/15 0840 02/08/15 0551  HGB 11.1* 9.2*  HCT 34.2* 29.1*    Assessment/Plan: Status post Cesarean section. Doing well postoperatively.  Continue current care.  Janeah Kovacich H. 02/08/2015, 11:41 AM

## 2015-02-09 LAB — GLUCOSE, CAPILLARY: Glucose-Capillary: 91 mg/dL (ref 65–99)

## 2015-02-09 MED ORDER — OXYCODONE-ACETAMINOPHEN 5-325 MG PO TABS: 1.0000 | ORAL_TABLET | ORAL | Status: DC | PRN

## 2015-02-09 NOTE — Lactation Note (Signed)
This note was copied from the chart of Mackenzie Marica Loyer. Lactation Consultation Note  Follow up visit made prior to discharge.  Mom states baby did not latch well during the night so she supplemented with formula.  Recommended we try a nipple shield before she goes home.  24 mm nipple shield used and baby latched well and nursed actively.  Drops of colostrum present in shield. Symphony pump rented and mom instructed to post pump every 3 hours.  Discussed that baby may desire small amount of supplement until milk comes in.  Mom understands close follow up for baby will be important.  Outpatient lactation services recommended and encouraged.  Patient Name: Mackenzie Meyers Today's Date: 02/09/2015 Reason for consult: Follow-up assessment;Breast surgery   Maternal Data    Feeding Feeding Type: Breast Fed Length of feed: 10 min  LATCH Score/Interventions Latch: Grasps breast easily, tongue down, lips flanged, rhythmical sucking.  Audible Swallowing: A few with stimulation Intervention(s): Skin to skin;Hand expression Intervention(s): Skin to skin;Hand expression;Alternate breast massage  Type of Nipple: Everted at rest and after stimulation  Comfort (Breast/Nipple): Soft / non-tender     Hold (Positioning): Assistance needed to correctly position infant at breast and maintain latch. Intervention(s): Breastfeeding basics reviewed;Support Pillows;Position options;Skin to skin  LATCH Score: 8  Lactation Tools Discussed/Used Tools: Nipple Shields Nipple shield size: 24 Pump Review: Setup, frequency, and cleaning;Milk Storage Initiated by:: [place] Date initiated:: 02/09/15   Consult Status      Carla Drape S 02/09/2015, 12:35 PM

## 2015-02-09 NOTE — Progress Notes (Signed)
  Patient is eating, ambulating, voiding.  Pain control is good.  Filed Vitals:   02/08/15 0649 02/08/15 1000 02/08/15 1848 02/09/15 0659  BP: 121/48 100/53 114/53 110/55  Pulse: 59 67 70 70  Temp: 98.2 F (36.8 C) 98.2 F (36.8 C) 98 F (36.7 C) 97.7 F (36.5 C)  TempSrc: Oral Oral Oral   Resp: 18 20 20 18   Height:      Weight:      SpO2:  98%      lungs:   clear to auscultation cor:    RRR Abdomen:  soft, appropriate tenderness, incisions intact and without erythema or exudate ex:    no cords   Lab Results  Component Value Date   WBC 12.5* 02/08/2015   HGB 9.2* 02/08/2015   HCT 29.1* 02/08/2015   MCV 76.8* 02/08/2015   PLT 230 02/08/2015    --/--/A POS, A POS (06/13 0840)/RI  A/P    Post operative Arkin 2.  Routine post op and postpartum care.  Expect d/c routine.  Percocet for pain control.

## 2015-02-09 NOTE — Discharge Summary (Signed)
Obstetric Discharge Summary Reason for Admission: cesarean section Prenatal Procedures: none Intrapartum Procedures: cesarean: low cervical, transverse Postpartum Procedures: none Complications-Operative and Postpartum: none HEMOGLOBIN  Date Value Ref Range Status  02/08/2015 9.2* 12.0 - 15.0 g/dL Final   HCT  Date Value Ref Range Status  02/08/2015 29.1* 36.0 - 46.0 % Final    Discharge Diagnoses: Term Pregnancy-delivered  Discharge Information: Date: 02/09/2015 Activity: pelvic rest Diet: routine Medications: Ibuprofen and Iron Condition: stable Instructions: refer to practice specific booklet Discharge to: home Follow-up Information    Follow up with [facility] Carlis Abbott, MD In 4 weeks.   Specialty:  Obstetrics   Contact information:   [place] [address] [phone]       Newborn Data: Live born female  Birth Weight: 8 lb 13.8 oz (4020 g) APGAR: 9, 9  Home with mother.  Demarius Archila A 02/09/2015, 7:55 AM

## 2015-02-24 ENCOUNTER — Encounter (HOSPITAL_COMMUNITY)
Admission: RE | Admit: 2015-02-24 | Discharge: 2015-02-24 | Disposition: A | Discharge status: Home / Self Care | Payer: BLUE CROSS/BLUE SHIELD | Source: Ambulatory Visit | Attending: Obstetrics and Gynecology | Admitting: Obstetrics and Gynecology

## 2015-02-24 DIAGNOSIS — A630 Anogenital (venereal) warts: Secondary | ICD-10-CM

## 2015-02-24 DIAGNOSIS — O923 Agalactia: Secondary | ICD-10-CM | POA: Diagnosis present

## 2015-02-24 HISTORY — DX: Anogenital (venereal) warts: A63.0

## 2015-03-03 ENCOUNTER — Ambulatory Visit (HOSPITAL_COMMUNITY): Admission: RE | Admit: 2015-03-03 | Payer: BLUE CROSS/BLUE SHIELD | Source: Ambulatory Visit

## 2015-03-08 ENCOUNTER — Other Ambulatory Visit: Payer: Self-pay

## 2015-03-09 LAB — CYTOLOGY - PAP

## 2015-03-27 HISTORY — PX: INTRAUTERINE DEVICE INSERTION: SHX323

## 2015-06-06 ENCOUNTER — Other Ambulatory Visit: Payer: Self-pay

## 2015-06-06 DIAGNOSIS — Z1231 Encounter for screening mammogram for malignant neoplasm of breast: Secondary | ICD-10-CM

## 2015-06-22 ENCOUNTER — Ambulatory Visit: Payer: Self-pay

## 2015-06-22 ENCOUNTER — Ambulatory Visit (INDEPENDENT_AMBULATORY_CARE_PROVIDER_SITE_OTHER): Payer: BLUE CROSS/BLUE SHIELD

## 2015-06-22 DIAGNOSIS — Z23 Encounter for immunization: Secondary | ICD-10-CM

## 2015-06-23 ENCOUNTER — Ambulatory Visit (INDEPENDENT_AMBULATORY_CARE_PROVIDER_SITE_OTHER): Payer: BLUE CROSS/BLUE SHIELD | Admitting: Gynecology

## 2015-06-23 ENCOUNTER — Encounter: Payer: Self-pay | Admitting: Gynecology

## 2015-06-23 VITALS — BP 120/76

## 2015-06-23 DIAGNOSIS — A499 Bacterial infection, unspecified: Secondary | ICD-10-CM | POA: Diagnosis not present

## 2015-06-23 DIAGNOSIS — B9689 Other specified bacterial agents as the cause of diseases classified elsewhere: Secondary | ICD-10-CM

## 2015-06-23 DIAGNOSIS — N760 Acute vaginitis: Secondary | ICD-10-CM

## 2015-06-23 LAB — WET PREP FOR TRICH, YEAST, CLUE
Trich, Wet Prep: NONE SEEN
Yeast Wet Prep HPF POC: NONE SEEN

## 2015-06-23 MED ORDER — METRONIDAZOLE 500 MG PO TABS: 500.0000 mg | ORAL_TABLET | Freq: Two times a day (BID) | ORAL | Status: DC

## 2015-06-23 NOTE — Progress Notes (Signed)
Mackenzie Meyers 06-May-1971 [id number]        44 y.o.  [id number] Presents with several weeks of vaginal discharge and odor. No dysuria frequency urgency.  Delivered this past year and had an IUD placed in June. Noted that she did have frequent bacterial infections whenever her last IUD was placed for several months afterwards.  Past medical history,surgical history, problem list, medications, allergies, family history and social history were all reviewed and documented in the EPIC chart.  Directed ROS with pertinent positives and negatives documented in the history of present illness/assessment and plan.  Exam: [name] assistant Filed Vitals:   06/23/15 1200  BP: 120/76   General appearance:  Normal Abdomen soft nontender without masses guarding rebound Pelvic external BUS vagina with yellowish discharge. Cervix normal. IUD string visualized. Uterus normal size midline mobile nontender. Adnexa without masses or tenderness  Assessment/Plan:  44 y.o. [id number] with symptoms and wet prep consistent with bacterial vaginosis. Will treat with Flagyl 500 mg twice a Grumbine 7 days at patient's choice. Alcohol avoidance reviewed. Follow up if symptoms persist, worsen or recur. Follow up in the spring when she is due for her annual exam.    [name] MD, 12:19 PM 06/23/2015

## 2015-06-23 NOTE — Patient Instructions (Signed)
Take the oral antibiotic twice daily for 7 days. Avoid alcohol while taking. Follow up if your symptoms or cyst, worsen or recur. Follow up in the spring for your annual exam

## 2015-06-30 ENCOUNTER — Ambulatory Visit: Payer: BLUE CROSS/BLUE SHIELD

## 2015-08-08 ENCOUNTER — Ambulatory Visit
Admission: RE | Admit: 2015-08-08 | Discharge: 2015-08-08 | Disposition: A | Discharge status: Home / Self Care | Payer: BLUE CROSS/BLUE SHIELD | Source: Ambulatory Visit

## 2015-08-08 DIAGNOSIS — Z1231 Encounter for screening mammogram for malignant neoplasm of breast: Secondary | ICD-10-CM

## 2015-10-18 ENCOUNTER — Encounter: Payer: Self-pay | Admitting: Family Medicine

## 2015-10-18 ENCOUNTER — Ambulatory Visit (INDEPENDENT_AMBULATORY_CARE_PROVIDER_SITE_OTHER): Payer: BLUE CROSS/BLUE SHIELD | Admitting: Family Medicine

## 2015-10-18 VITALS — BP 117/77 | HR 73 | Temp 97.3°F | Ht 64.0 in | Wt 204.2 lb

## 2015-10-18 DIAGNOSIS — N898 Other specified noninflammatory disorders of vagina: Secondary | ICD-10-CM | POA: Diagnosis not present

## 2015-10-18 DIAGNOSIS — N760 Acute vaginitis: Secondary | ICD-10-CM

## 2015-10-18 DIAGNOSIS — B9689 Other specified bacterial agents as the cause of diseases classified elsewhere: Secondary | ICD-10-CM

## 2015-10-18 DIAGNOSIS — A499 Bacterial infection, unspecified: Secondary | ICD-10-CM

## 2015-10-18 LAB — POCT WET PREP (WET MOUNT): Trichomonas Prep: NEGATIVE

## 2015-10-18 MED ORDER — FLUCONAZOLE 150 MG PO TABS: 150.0000 mg | ORAL_TABLET | Freq: Once | ORAL | Status: DC

## 2015-10-18 MED ORDER — METRONIDAZOLE 500 MG PO TABS: 500.0000 mg | ORAL_TABLET | Freq: Two times a day (BID) | ORAL | Status: DC

## 2015-10-18 NOTE — Progress Notes (Signed)
BP 117/77 mmHg  Pulse 73  Temp(Src) 97.3 F (36.3 C) (Oral)  Ht 5\' 4"  (1.626 m)  Wt 204 lb 3.2 oz (92.625 kg)  BMI 35.03 kg/m2   Subjective:    Patient ID: Mackenzie Meyers, female    DOB: 1970-12-26, 45 y.o.   MRN: [id number]  HPI: Mackenzie Meyers is a 45 y.o. female presenting on 10/18/2015 for Vaginal odor, discharge, moisture   HPI Vaginal discharge Patient comes in complaining of vaginal discharge and odor and moisture down in that region that has been going on for about for 5 days. She says she does have a history of getting these especially when she is on IUD. She has been on IUD for the past 6 months. She denies any fevers or chills or urinary discomfort or abdominal pain. She just has more the vaginal irritation. The odor is the biggest concern as well. Her discharge is thinner discharged when she has  Relevant past medical, surgical, family and social history reviewed and updated as indicated. Interim medical history since our last visit reviewed. Allergies and medications reviewed and updated.  Review of Systems  Constitutional: Negative for fever and chills.  HENT: Negative for congestion, ear discharge and ear pain.   Eyes: Negative for redness and visual disturbance.  Respiratory: Negative for chest tightness and shortness of breath.   Cardiovascular: Negative for chest pain and leg swelling.  Gastrointestinal: Negative for abdominal pain and anal bleeding.  Genitourinary: Negative for dysuria and difficulty urinating.  Musculoskeletal: Negative for back pain and gait problem.  Skin: Negative for rash.  Neurological: Negative for dizziness, light-headedness and headaches.  Psychiatric/Behavioral: Negative for behavioral problems and agitation.  All other systems reviewed and are negative.   Per HPI unless specifically indicated above     Medication List       This list is accurate as of: 10/18/15 11:16 AM.  Always use your most recent med list.               acetaminophen 500 MG tablet  Commonly known as:  TYLENOL  Take 1,000 mg by mouth every 6 (six) hours as needed for mild pain, moderate pain or headache.     fluconazole 150 MG tablet  Commonly known as:  DIFLUCAN  Take 1 tablet (150 mg total) by mouth once. May repeat after 7 days     levonorgestrel 20 MCG/24HR IUD  Commonly known as:  MIRENA  1 each by Intrauterine route once.     metroNIDAZOLE 500 MG tablet  Commonly known as:  FLAGYL  Take 1 tablet (500 mg total) by mouth 2 (two) times daily. For 7 days.  Avoid alcohol while taking     VITAMIN D PO  Take 2,000 Units by mouth every morning.           Objective:    BP 117/77 mmHg  Pulse 73  Temp(Src) 97.3 F (36.3 C) (Oral)  Ht 5\' 4"  (1.626 m)  Wt 204 lb 3.2 oz (92.625 kg)  BMI 35.03 kg/m2  Wt Readings from Last 3 Encounters:  10/18/15 204 lb 3.2 oz (92.625 kg)  02/07/15 212 lb (96.163 kg)  01/30/15 214 lb (97.07 kg)    Physical Exam  Constitutional: She is oriented to person, place, and time. She appears well-developed and well-nourished. No distress.  Eyes: Conjunctivae and EOM are normal. Pupils are equal, round, and reactive to light.  Neck: Neck supple. No thyromegaly present.  Cardiovascular: Normal rate, regular rhythm, normal  heart sounds and intact distal pulses.   No murmur heard. Pulmonary/Chest: Effort normal and breath sounds normal. No respiratory distress. She has no wheezes.  Musculoskeletal: Normal range of motion. She exhibits no edema or tenderness.  Lymphadenopathy:    She has no cervical adenopathy.  Neurological: She is alert and oriented to person, place, and time. Coordination normal.  Skin: Skin is warm and dry. No rash noted. She is not diaphoretic.  Psychiatric: She has a normal mood and affect. Her behavior is normal.  Nursing note and vitals reviewed.   Results for orders placed or performed in visit on 10/18/15  POCT Wet Prep [facility])  Result Value Ref Range   Source Wet Prep  POC vaginal    WBC, Wet Prep HPF POC occ    Bacteria Wet Prep HPF POC Moderate (A) None, Few, Too numerous to count   Clue Cells Wet Prep HPF POC None None, Too numerous to count   Yeast Wet Prep HPF POC None    KOH Wet Prep POC     Trichomonas Prep negative       Assessment & Plan:   Problem List Items Addressed This Visit    None    Visit Diagnoses    Vaginal discharge    -  Primary    Relevant Medications    metroNIDAZOLE (FLAGYL) 500 MG tablet    fluconazole (DIFLUCAN) 150 MG tablet    Other Relevant Orders    POCT Wet Prep ([place]) (Completed)    POCT Wet Prep with KOH    Bacterial vaginosis        Relevant Medications    metroNIDAZOLE (FLAGYL) 500 MG tablet    fluconazole (DIFLUCAN) 150 MG tablet    Other Relevant Orders    POCT Wet Prep ([place]) (Completed)    POCT Wet Prep with KOH       Follow up plan: Return if symptoms worsen or fail to improve.  Counseling provided for all of the vaccine components Orders Placed This Encounter  Procedures  . POCT Wet Prep [employer])  . POCT Wet Prep with [place], MD [facility] 10/18/2015, 11:16 AM

## 2015-10-23 ENCOUNTER — Encounter: Payer: Self-pay | Admitting: Family

## 2015-10-23 ENCOUNTER — Ambulatory Visit: Payer: BLUE CROSS/BLUE SHIELD | Admitting: Family

## 2015-10-23 ENCOUNTER — Ambulatory Visit (INDEPENDENT_AMBULATORY_CARE_PROVIDER_SITE_OTHER): Payer: BLUE CROSS/BLUE SHIELD | Admitting: Family

## 2015-10-23 VITALS — BP 109/66 | HR 72 | Temp 98.5°F | Ht 64.0 in | Wt 207.8 lb

## 2015-10-23 DIAGNOSIS — J029 Acute pharyngitis, unspecified: Secondary | ICD-10-CM

## 2015-10-23 DIAGNOSIS — J069 Acute upper respiratory infection, unspecified: Secondary | ICD-10-CM | POA: Diagnosis not present

## 2015-10-23 DIAGNOSIS — R6889 Other general symptoms and signs: Secondary | ICD-10-CM

## 2015-10-23 LAB — POCT INFLUENZA A/B
Influenza A, POC: NEGATIVE
Influenza B, POC: NEGATIVE

## 2015-10-23 LAB — POCT RAPID STREP A (OFFICE): Rapid Strep A Screen: NEGATIVE

## 2015-10-23 MED ORDER — FLUTICASONE PROPIONATE 50 MCG/ACT NA SUSP
2.0000 | Freq: Every day | NASAL | Status: DC
Start: 2015-10-23 — End: 2016-10-01

## 2015-10-23 NOTE — Patient Instructions (Signed)
Upper Respiratory Infection, Adult Most upper respiratory infections (URIs) are a viral infection of the air passages leading to the lungs. A URI affects the nose, throat, and upper air passages. The most common type of URI is nasopharyngitis and is typically referred to as "the common cold." URIs run their course and usually go away on their own. Most of the time, a URI does not require medical attention, but sometimes a bacterial infection in the upper airways can follow a viral infection. This is called a secondary infection. Sinus and middle ear infections are common types of secondary upper respiratory infections. Bacterial pneumonia can also complicate a URI. A URI can worsen asthma and chronic obstructive pulmonary disease (COPD). Sometimes, these complications can require emergency medical care and may be life threatening.  CAUSES Almost all URIs are caused by viruses. A virus is a type of germ and can spread from one person to another.  RISKS FACTORS You may be at risk for a URI if:   You smoke.   You have chronic heart or lung disease.  You have a weakened defense (immune) system.   You are very young or very old.   You have nasal allergies or asthma.  You work in crowded or poorly ventilated areas.  You work in health care facilities or schools. SIGNS AND SYMPTOMS  Symptoms typically develop 2-3 days after you come in contact with a cold virus. Most viral URIs last 7-10 days. However, viral URIs from the influenza virus (flu virus) can last 14-18 days and are typically more severe. Symptoms may include:   Runny or stuffy (congested) nose.   Sneezing.   Cough.   Sore throat.   Headache.   Fatigue.   Fever.   Loss of appetite.   Pain in your forehead, behind your eyes, and over your cheekbones (sinus pain).  Muscle aches.  DIAGNOSIS  Your health care provider may diagnose a URI by:  Physical exam.  Tests to check that your symptoms are not due to  another condition such as:  Strep throat.  Sinusitis.  Pneumonia.  Asthma. TREATMENT  A URI goes away on its own with time. It cannot be cured with medicines, but medicines may be prescribed or recommended to relieve symptoms. Medicines may help:  Reduce your fever.  Reduce your cough.  Relieve nasal congestion. HOME CARE INSTRUCTIONS   Take medicines only as directed by your health care provider.   Gargle warm saltwater or take cough drops to comfort your throat as directed by your health care provider.  Use a warm mist humidifier or inhale steam from a shower to increase air moisture. This may make it easier to breathe.  Drink enough fluid to keep your urine clear or pale yellow.   Eat soups and other clear broths and maintain good nutrition.   Rest as needed.   Return to work when your temperature has returned to normal or as your health care provider advises. You may need to stay home longer to avoid infecting others. You can also use a face mask and careful hand washing to prevent spread of the virus.  Increase the usage of your inhaler if you have asthma.   Do not use any tobacco products, including cigarettes, chewing tobacco, or electronic cigarettes. If you need help quitting, ask your health care provider. PREVENTION  The best way to protect yourself from getting a cold is to practice good hygiene.   Avoid oral or hand contact with people with cold   symptoms.   Wash your hands often if contact occurs.  There is no clear evidence that vitamin C, vitamin E, echinacea, or exercise reduces the chance of developing a cold. However, it is always recommended to get plenty of rest, exercise, and practice good nutrition.  SEEK MEDICAL CARE IF:   You are getting worse rather than better.   Your symptoms are not controlled by medicine.   You have chills.  You have worsening shortness of breath.  You have brown or red mucus.  You have yellow or brown nasal  discharge.  You have pain in your face, especially when you bend forward.  You have a fever.  You have swollen neck glands.  You have pain while swallowing.  You have white areas in the back of your throat. SEEK IMMEDIATE MEDICAL CARE IF:   You have severe or persistent:  Headache.  Ear pain.  Sinus pain.  Chest pain.  You have chronic lung disease and any of the following:  Wheezing.  Prolonged cough.  Coughing up blood.  A change in your usual mucus.  You have a stiff neck.  You have changes in your:  Vision.  Hearing.  Thinking.  Mood. MAKE SURE YOU:   Understand these instructions.  Will watch your condition.  Will get help right away if you are not doing well or get worse.   This information is not intended to replace advice given to you by your health care provider. Make sure you discuss any questions you have with your health care provider.  - Take meds as prescribed - Use a cool mist humidifier  -Use saline nose sprays frequently -Saline irrigations of the nose can be very helpful if done frequently.  * 4X daily for 1 week*  * Use of a nettie pot can be helpful with this. Follow directions with this* -Force fluids -For any cough or congestion  Use plain Mucinex- regular strength or max strength is fine   * Children- consult with Pharmacist for dosing -For fever or aces or pains- take tylenol or ibuprofen appropriate for age and weight.  * for fevers greater than 101 orally you may alternate ibuprofen and tylenol every  3 hours. -Throat lozenges if help -New toothbrush in 3 days   Mackenzie Dun, FNP    Document Released: 02/05/2001 Document Revised: 12/27/2014 Document Reviewed: 11/17/2013 Elsevier Interactive Patient Education 2016 [employer].

## 2015-10-23 NOTE — Progress Notes (Addendum)
Subjective:    Patient ID: Mackenzie Meyers, female    DOB: November 17, 1970, 45 y.o.   MRN: [id number]  Sore Throat  This is a new problem. The current episode started yesterday. The problem has been gradually worsening. The maximum temperature recorded prior to her arrival was 101 - 101.9 F. The pain is moderate. Associated symptoms include congestion, a hoarse voice and trouble swallowing. Pertinent negatives include no coughing, ear discharge, ear pain, headaches, plugged ear sensation or shortness of breath. She has tried NSAIDs for the symptoms. The treatment provided mild relief.  Fever  Associated symptoms include congestion. Pertinent negatives include no coughing, ear pain or headaches.      Review of Systems  Constitutional: Positive for fever.  HENT: Positive for congestion, hoarse voice and trouble swallowing. Negative for ear discharge and ear pain.   Eyes: Negative.   Respiratory: Negative.  Negative for cough and shortness of breath.   Cardiovascular: Negative.  Negative for palpitations.  Gastrointestinal: Negative.   Endocrine: Negative.   Genitourinary: Negative.   Musculoskeletal: Negative.   Neurological: Negative.  Negative for headaches.  Hematological: Negative.   Psychiatric/Behavioral: Negative.   All other systems reviewed and are negative.      Objective:   Physical Exam  Constitutional: She is oriented to person, place, and time. She appears well-developed and well-nourished. No distress.  HENT:  Head: Normocephalic and atraumatic.  Right Ear: External ear normal.  Left Ear: External ear normal.  Nasal passage erythemas with mild swelling  Oropharynx erythemas   Eyes: Pupils are equal, round, and reactive to light.  Neck: Normal range of motion. Neck supple. No thyromegaly present.  Cardiovascular: Normal rate, regular rhythm, normal heart sounds and intact distal pulses.   No murmur heard. Pulmonary/Chest: Effort normal and breath sounds normal. No  respiratory distress. She has no wheezes.  Abdominal: Soft. Bowel sounds are normal. She exhibits no distension. There is no tenderness.  Musculoskeletal: Normal range of motion. She exhibits no edema or tenderness.  Neurological: She is alert and oriented to person, place, and time. She has normal reflexes. No cranial nerve deficit.  Skin: Skin is warm and dry.  Psychiatric: She has a normal mood and affect. Her behavior is normal. Judgment and thought content normal.  Vitals reviewed.     BP 109/66 mmHg  Pulse 72  Temp(Src) 98.5 F (36.9 C) (Oral)  Ht 5\' 4"  (1.626 m)  Wt 207 lb 12.8 oz (94.257 kg)  BMI 35.65 kg/m2     Assessment & Plan:  1. Flu-like symptoms - POCT rapid strep A - POCT Influenza A/B  2. Viral upper respiratory infection -- Take meds as prescribed - Use a cool mist humidifier  -Use saline nose sprays frequently -Saline irrigations of the nose can be very helpful if done frequently.  * 4X daily for 1 week*  * Use of a nettie pot can be helpful with this. Follow directions with this* -Force fluids -For any cough or congestion  Use plain Mucinex- regular strength or max strength is fine   * Children- consult with Pharmacist for dosing -For fever or aces or pains- take tylenol or ibuprofen appropriate for age and weight.  * for fevers greater than 101 orally you may alternate ibuprofen and tylenol every  3 hours. -Throat lozenges if help -New toothbrush in 3 days  Meds ordered this encounter  Medications  . ibuprofen (ADVIL,MOTRIN) 200 MG tablet    Sig: Take 200 mg by mouth every 6 (  six) hours as needed. Patient takes 4 at a time  . fluticasone (FLONASE) 50 MCG/ACT nasal spray    Sig: Place 2 sprays into both nostrils daily.    Dispense:  16 g    Refill:  6    Order Specific Question:  Supervising Provider    Answer:  Chipper Herb [[id number]]    Evelina Dun, FNP

## 2015-10-23 NOTE — Addendum Note (Signed)
Addended by: Evelina Dun A on: 10/23/2015 12:21 PM   Modules accepted: Orders, SmartSet

## 2016-01-06 ENCOUNTER — Ambulatory Visit (INDEPENDENT_AMBULATORY_CARE_PROVIDER_SITE_OTHER): Payer: BLUE CROSS/BLUE SHIELD | Admitting: Family Medicine

## 2016-01-06 ENCOUNTER — Encounter: Payer: Self-pay | Admitting: Family Medicine

## 2016-01-06 VITALS — BP 113/73 | HR 68 | Temp 97.3°F | Ht 64.0 in | Wt 199.0 lb

## 2016-01-06 DIAGNOSIS — B001 Herpesviral vesicular dermatitis: Secondary | ICD-10-CM | POA: Diagnosis not present

## 2016-01-06 DIAGNOSIS — H66002 Acute suppurative otitis media without spontaneous rupture of ear drum, left ear: Secondary | ICD-10-CM

## 2016-01-06 MED ORDER — FLUCONAZOLE 150 MG PO TABS: 150.0000 mg | ORAL_TABLET | Freq: Once | ORAL | Status: DC

## 2016-01-06 MED ORDER — AZITHROMYCIN 250 MG PO TABS: ORAL_TABLET | ORAL | Status: DC

## 2016-01-06 MED ORDER — VALACYCLOVIR HCL 1 G PO TABS: 1000.0000 mg | ORAL_TABLET | Freq: Two times a day (BID) | ORAL | Status: DC

## 2016-01-06 NOTE — Progress Notes (Signed)
BP 113/73 mmHg  Pulse 68  Temp(Src) 97.3 F (36.3 C) (Oral)  Ht 5\' 4"  (1.626 m)  Wt 199 lb (90.266 kg)  BMI 34.14 kg/m2   Subjective:    Patient ID: Mackenzie Meyers, female    DOB: 1971-04-07, 45 y.o.   MRN: [id number]  HPI: Mackenzie Meyers is a 45 y.o. female presenting on 01/06/2016 for Otalgia and FEVER BLISTERS   HPI Left ear pain Patient has been having left ear pain for the past few days. She denies any fevers or chills or nasal congestion or pressure. She feels like her ears about to pop. She's not had this recurrently and this is new for her. She denies any shortness of breath or wheezing or cough or congestion.  Cold sores Patient is coming in for significant cold sores that are worse than she normally gets some. She normally gets cold sores about once a month throughout the year. This is the worst that she has them. She has at least 5 or 6 lesions on her lower lip one on her upper lip and one on her chin. They're very irritating the most part he burst open and her past that initial stage.  Relevant past medical, surgical, family and social history reviewed and updated as indicated. Interim medical history since our last visit reviewed. Allergies and medications reviewed and updated.  Review of Systems  Constitutional: Negative for fever and chills.  HENT: Positive for ear pain and mouth sores. Negative for congestion, ear discharge, postnasal drip, rhinorrhea, sinus pressure and sneezing.   Eyes: Negative for redness and visual disturbance.  Respiratory: Negative for chest tightness and shortness of breath.   Cardiovascular: Negative for chest pain and leg swelling.  Genitourinary: Negative for dysuria and difficulty urinating.  Musculoskeletal: Negative for back pain and gait problem.  Skin: Negative for rash.  Neurological: Negative for light-headedness and headaches.  Psychiatric/Behavioral: Negative for behavioral problems and agitation.  All other systems reviewed and  are negative.   Per HPI unless specifically indicated above     Medication List       This list is accurate as of: 01/06/16 11:13 AM.  Always use your most recent med list.               acetaminophen 500 MG tablet  Commonly known as:  TYLENOL  Take 1,000 mg by mouth every 6 (six) hours as needed for mild pain, moderate pain or headache.     azithromycin 250 MG tablet  Commonly known as:  ZITHROMAX  Take 2 the first [place] and then one each [place] after.     fluconazole 150 MG tablet  Commonly known as:  DIFLUCAN  Take 1 tablet (150 mg total) by mouth once. May repeat after 7 days     fluticasone 50 MCG/ACT nasal spray  Commonly known as:  FLONASE  Place 2 sprays into both nostrils daily.     ibuprofen 200 MG tablet  Commonly known as:  ADVIL,MOTRIN  Take 200 mg by mouth every 6 (six) hours as needed. Patient takes 4 at a time     levonorgestrel 20 MCG/24HR IUD  Commonly known as:  MIRENA  1 each by Intrauterine route once.     valACYclovir 1000 MG tablet  Commonly known as:  VALTREX  Take 1 tablet (1,000 mg total) by mouth 2 (two) times daily.     VITAMIN D PO  Take 2,000 Units by mouth every morning.  Objective:    BP 113/73 mmHg  Pulse 68  Temp(Src) 97.3 F (36.3 C) (Oral)  Ht 5\' 4"  (1.626 m)  Wt 199 lb (90.266 kg)  BMI 34.14 kg/m2  Wt Readings from Last 3 Encounters:  01/06/16 199 lb (90.266 kg)  10/23/15 207 lb 12.8 oz (94.257 kg)  10/18/15 204 lb 3.2 oz (92.625 kg)    Physical Exam  Constitutional: She is oriented to person, place, and time. She appears well-developed and well-nourished. No distress.  HENT:  Right Ear: External ear normal.  Left Ear: Tympanic membrane is injected, erythematous and bulging. Tympanic membrane is not scarred and not perforated. A middle ear effusion is present.  Nose: Nose normal.  Mouth/Throat: Oropharynx is clear and moist. No oropharyngeal exudate.    Eyes: Conjunctivae and EOM are normal. Pupils are  equal, round, and reactive to light.  Cardiovascular: Normal rate, regular rhythm, normal heart sounds and intact distal pulses.   No murmur heard. Pulmonary/Chest: Effort normal and breath sounds normal. No respiratory distress. She has no wheezes.  Musculoskeletal: Normal range of motion. She exhibits no edema or tenderness.  Neurological: She is alert and oriented to person, place, and time. Coordination normal.  Skin: Skin is warm and dry. No rash noted. She is not diaphoretic.  Psychiatric: She has a normal mood and affect. Her behavior is normal.  Nursing note and vitals reviewed.     Assessment & Plan:   Problem List Items Addressed This Visit    None    Visit Diagnoses    Acute suppurative otitis media of left ear without spontaneous rupture of tympanic membrane, recurrence not specified    -  Primary    Relevant Medications    fluconazole (DIFLUCAN) 150 MG tablet    azithromycin (ZITHROMAX) 250 MG tablet    valACYclovir (VALTREX) 1000 MG tablet    Recurrent cold sores        Relevant Medications    fluconazole (DIFLUCAN) 150 MG tablet    azithromycin (ZITHROMAX) 250 MG tablet    valACYclovir (VALTREX) 1000 MG tablet        Follow up plan: Return if symptoms worsen or fail to improve.  Counseling provided for all of the vaccine components No orders of the defined types were placed in this encounter.    Caryl Pina, MD [facility] 01/06/2016, 11:13 AM

## 2016-01-10 ENCOUNTER — Telehealth: Payer: Self-pay | Admitting: Family Medicine

## 2016-01-10 NOTE — Telephone Encounter (Signed)
Patient advised of recommendations.  

## 2016-01-10 NOTE — Telephone Encounter (Signed)
Continue the Flonase and Mucinex and an antihistamine, does azithromycin stays in your system for about 5 days even after the pills are done, given at least 3 or 4 more days of doing those other things to see if it's going to resolve or not. If not better in 3 or 4 days give [place] a call.

## 2016-02-20 ENCOUNTER — Encounter: Payer: Self-pay | Admitting: Family

## 2016-02-20 ENCOUNTER — Ambulatory Visit (INDEPENDENT_AMBULATORY_CARE_PROVIDER_SITE_OTHER): Payer: BLUE CROSS/BLUE SHIELD | Admitting: Family

## 2016-02-20 VITALS — BP 108/72 | HR 76 | Temp 97.8°F | Ht 64.0 in | Wt 198.0 lb

## 2016-02-20 DIAGNOSIS — N898 Other specified noninflammatory disorders of vagina: Secondary | ICD-10-CM

## 2016-02-20 DIAGNOSIS — N760 Acute vaginitis: Secondary | ICD-10-CM

## 2016-02-20 DIAGNOSIS — B9689 Other specified bacterial agents as the cause of diseases classified elsewhere: Secondary | ICD-10-CM

## 2016-02-20 DIAGNOSIS — A499 Bacterial infection, unspecified: Secondary | ICD-10-CM | POA: Diagnosis not present

## 2016-02-20 DIAGNOSIS — N9489 Other specified conditions associated with female genital organs and menstrual cycle: Secondary | ICD-10-CM | POA: Diagnosis not present

## 2016-02-20 LAB — WET PREP FOR TRICH, YEAST, CLUE
Clue Cell Exam: NEGATIVE
Trichomonas Exam: NEGATIVE
Yeast Exam: NEGATIVE

## 2016-02-20 MED ORDER — METRONIDAZOLE 500 MG PO TABS: 500.0000 mg | ORAL_TABLET | Freq: Two times a day (BID) | ORAL | Status: DC

## 2016-02-20 NOTE — Progress Notes (Signed)
   Subjective:    Patient ID: Bruce Donath [place], female    DOB: Jun 12, 1971, 45 y.o.   MRN: [id number]  Vaginal Discharge The patient's primary symptoms include a genital odor and vaginal discharge. The patient's pertinent negatives include no genital itching. This is a new problem. The current episode started 1 to 4 weeks ago. The problem occurs constantly. The problem has been unchanged. The patient is experiencing no pain. Pertinent negatives include no back pain, constipation, diarrhea, frequency, headaches, hematuria, sore throat or urgency. There has been no bleeding. She has tried nothing for the symptoms. The treatment provided no relief. No, her partner does not have an STD.      Review of Systems  Constitutional: Negative.   HENT: Negative.  Negative for sore throat.   Eyes: Negative.   Respiratory: Negative.  Negative for shortness of breath.   Cardiovascular: Negative.  Negative for palpitations.  Gastrointestinal: Negative.  Negative for diarrhea and constipation.  Endocrine: Negative.   Genitourinary: Positive for vaginal discharge. Negative for urgency, frequency and hematuria.  Musculoskeletal: Negative.  Negative for back pain.  Neurological: Negative.  Negative for headaches.  Hematological: Negative.   Psychiatric/Behavioral: Negative.   All other systems reviewed and are negative.      Objective:   Physical Exam  Constitutional: She is oriented to person, place, and time. She appears well-developed and well-nourished. No distress.  HENT:  Head: Normocephalic and atraumatic.  Eyes: Pupils are equal, round, and reactive to light.  Neck: Normal range of motion. Neck supple. No thyromegaly present.  Cardiovascular: Normal rate, regular rhythm, normal heart sounds and intact distal pulses.   No murmur heard. Pulmonary/Chest: Effort normal and breath sounds normal. No respiratory distress. She has no wheezes.  Abdominal: Soft. Bowel sounds are normal. She exhibits no  distension. There is no tenderness.  Musculoskeletal: Normal range of motion. She exhibits no edema or tenderness.  Neurological: She is alert and oriented to person, place, and time.  Skin: Skin is warm and dry.  Psychiatric: She has a normal mood and affect. Her behavior is normal. Judgment and thought content normal.  Vitals reviewed.   BP 108/72 mmHg  Pulse 76  Temp(Src) 97.8 F (36.6 C) (Oral)  Ht 5\' 4"  (1.626 m)  Wt 198 lb (89.812 kg)  BMI 33.97 kg/m2       Assessment & Plan:  1. Vaginal odor - WET PREP FOR TRICH, YEAST, CLUE  2. Vaginal discharge - WET PREP FOR TRICH, YEAST, CLUE  3. BV (bacterial vaginosis) -Keep clean and dry -Take daily probiotic  -Avoid sex until antibiotic complete RTO prn - metroNIDAZOLE (FLAGYL) 500 MG tablet; Take 1 tablet (500 mg total) by mouth 2 (two) times daily.  Dispense: 14 tablet; Refill: 0    Evelina Dun, FNP

## 2016-02-20 NOTE — Patient Instructions (Signed)

## 2016-03-18 ENCOUNTER — Encounter: Payer: Self-pay | Admitting: Gynecology

## 2016-03-18 ENCOUNTER — Ambulatory Visit (INDEPENDENT_AMBULATORY_CARE_PROVIDER_SITE_OTHER): Payer: BLUE CROSS/BLUE SHIELD | Admitting: Gynecology

## 2016-03-18 VITALS — BP 116/74 | Ht 64.0 in | Wt 199.0 lb

## 2016-03-18 DIAGNOSIS — R87810 Cervical high risk human papillomavirus (HPV) DNA test positive: Secondary | ICD-10-CM | POA: Diagnosis not present

## 2016-03-18 DIAGNOSIS — N898 Other specified noninflammatory disorders of vagina: Secondary | ICD-10-CM | POA: Diagnosis not present

## 2016-03-18 DIAGNOSIS — Z01419 Encounter for gynecological examination (general) (routine) without abnormal findings: Secondary | ICD-10-CM | POA: Diagnosis not present

## 2016-03-18 LAB — WET PREP FOR TRICH, YEAST, CLUE
Trich, Wet Prep: NONE SEEN
Yeast Wet Prep HPF POC: NONE SEEN

## 2016-03-18 MED ORDER — FLUCONAZOLE 150 MG PO TABS: 150.0000 mg | ORAL_TABLET | Freq: Once | ORAL | 0 refills | Status: AC

## 2016-03-18 NOTE — Addendum Note (Signed)
Addended by: Nelva Nay on: 03/18/2016 03:54 PM   Modules accepted: Orders

## 2016-03-18 NOTE — Progress Notes (Signed)
    Mackenzie Meyers 11-07-1970 [id number]        45 y.o.  [id number]  for annual exam.  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 Also had Mirena IUD placed August 2016 at [facility] and doing well with this.  Past medical history,surgical history, problem list, medications, allergies, family history and social history were all reviewed and documented as reviewed in the EPIC chart.  ROS:  Performed with pertinent positives and negatives included in the history, assessment and plan.   Additional significant findings :  None   Exam: [name] assistant Vitals:   03/18/16 1455  BP: 116/74  Weight: 199 lb (90.3 kg)  Height: 5\' 4"  (1.626 m)   General appearance:  Normal affect, orientation and appearance. Skin: Grossly normal HEENT: Without gross lesions.  No cervical or supraclavicular adenopathy. Thyroid normal.  Lungs:  Clear without wheezing, rales or rhonchi Cardiac: RR, without RMG Abdominal:  Soft, nontender, without masses, guarding, rebound, organomegaly or hernia Breasts:  Examined lying and sitting without masses, retractions, discharge or axillary adenopathy. With well-healed bilateral reduction scars Pelvic:  Ext/BUS/Vagina with white discharge.  Cervix normal with IUD string visualized. Pap smear/HPV done  Uterus anteverted, normal size, shape and contour, midline and mobile nontender   Adnexa without masses or tenderness    Anus and perineum normal   Rectovaginal normal sphincter tone without palpated masses or tenderness.    Assessment/Plan:  45 y.o. [id number] female for annual exam without menses, Mirena IUD.   1. Mirena IUD 03/2015. Doing well without menses. IUD string visualized. 2. Last Pap smear 02/2015 with positive high-risk HPV, normal cytology. History of dysplasia as a teenager. Check baseline Pap smear/HPV today.  Possibility for colposcopy reviewed if HPV or cytology abnormal. 3. Vaginal irritation with discharge.  Has been going on over the last couple weeks coming and going. Mostly itching. No vaginal odor or urinary symptoms such as frequency dysuria urgency low back pain. No fever or chills. No nausea, vomiting, diarrhea, constipation. Was treated for bacterial vaginosis last fall. Wet prep is unremarkable. Clinically it appeared as a yeast infection and given the time of year and recurrent yeast infections in the past will cover with Diflucan 150 mg 1 dose. Extra pill given in case she has a recurrence. Follow up if symptoms would persist, worsen or recur. 4. Mammography 07/2015. Recommend repeat mammography when due annually. SBE monthly reviewed. 5. Health maintenance. Patient declines routine blood work and states she will do this at her primary physician's office. Follow up in one year, sooner if Pap smear abnormal or any other issues.  10 minutes of my time in excess of her annual gynecologic exam was spent in direct face to face counseling and coordination of care in regards to her problems of vaginal discharge and irritation.  [name] MD, 3:25 PM 03/18/2016

## 2016-03-18 NOTE — Patient Instructions (Signed)
Take the Diflucan pill for the vaginal discharge and irritation. Follow up if your symptoms persist, worsen or recur.  You may obtain a copy of any labs that were done today by logging onto MyChart as outlined in the instructions provided with your AVS (after visit summary). The office will not call with normal lab results but certainly if there are any significant abnormalities then we will contact you.   Health Maintenance Adopting a healthy lifestyle and getting preventive care can go a long way to promote health and wellness. Talk with your health care provider about what schedule of regular examinations is right for you. This is a good chance for you to check in with your provider about disease prevention and staying healthy. In between checkups, there are plenty of things you can do on your own. Experts have done a lot of research about which lifestyle changes and preventive measures are most likely to keep you healthy. Ask your health care provider for more information. WEIGHT AND DIET  Eat a healthy diet  Be sure to include plenty of vegetables, fruits, low-fat dairy products, and lean protein.  Do not eat a lot of foods high in solid fats, added sugars, or salt.  Get regular exercise. This is one of the most important things you can do for your health.  Most adults should exercise for at least 150 minutes each week. The exercise should increase your heart rate and make you sweat (moderate-intensity exercise).  Most adults should also do strengthening exercises at least twice a week. This is in addition to the moderate-intensity exercise.  Maintain a healthy weight  Body mass index (BMI) is a measurement that can be used to identify possible weight problems. It estimates body fat based on height and weight. Your health care provider can help determine your BMI and help you achieve or maintain a healthy weight.  For females 28 years of age and older:   A BMI below 18.5 is considered  underweight.  A BMI of 18.5 to 24.9 is normal.  A BMI of 25 to 29.9 is considered overweight.  A BMI of 30 and above is considered obese.  Watch levels of cholesterol and blood lipids  You should start having your blood tested for lipids and cholesterol at 43 years of age, then have this test every 5 years.  You may need to have your cholesterol levels checked more often if:  Your lipid or cholesterol levels are high.  You are older than 43 years of age.  You are at high risk for heart disease.  CANCER SCREENING   Lung Cancer  Lung cancer screening is recommended for adults 4-51 years old who are at high risk for lung cancer because of a history of smoking.  A yearly low-dose CT scan of the lungs is recommended for people who:  Currently smoke.  Have quit within the past 15 years.  Have at least a 30-pack-year history of smoking. A pack year is smoking an average of one pack of cigarettes a [place] for 1 year.  Yearly screening should continue until it has been 15 years since you quit.  Yearly screening should stop if you develop a health problem that would prevent you from having lung cancer treatment.  Breast Cancer  Practice breast self-awareness. This means understanding how your breasts normally appear and feel.  It also means doing regular breast self-exams. Let your health care provider know about any changes, no matter how small.  If you are  in your 92s or 30s, you should have a clinical breast exam (CBE) by a health care provider every 1-3 years as part of a regular health exam.  If you are 9 or older, have a CBE every year. Also consider having a breast X-ray (mammogram) every year.  If you have a family history of breast cancer, talk to your health care provider about genetic screening.  If you are at high risk for breast cancer, talk to your health care provider about having an MRI and a mammogram every year.  Breast cancer gene (BRCA) assessment is  recommended for women who have family members with BRCA-related cancers. BRCA-related cancers include:  Breast.  Ovarian.  Tubal.  Peritoneal cancers.  Results of the assessment will determine the need for genetic counseling and BRCA1 and BRCA2 testing. Cervical Cancer Routine pelvic examinations to screen for cervical cancer are no longer recommended for nonpregnant women who are considered low risk for cancer of the pelvic organs (ovaries, uterus, and vagina) and who do not have symptoms. A pelvic examination may be necessary if you have symptoms including those associated with pelvic infections. Ask your health care provider if a screening pelvic exam is right for you.   The Pap test is the screening test for cervical cancer for women who are considered at risk.  If you had a hysterectomy for a problem that was not cancer or a condition that could lead to cancer, then you no longer need Pap tests.  If you are older than 65 years, and you have had normal Pap tests for the past 10 years, you no longer need to have Pap tests.  If you have had past treatment for cervical cancer or a condition that could lead to cancer, you need Pap tests and screening for cancer for at least 20 years after your treatment.  If you no longer get a Pap test, assess your risk factors if they change (such as having a new sexual partner). This can affect whether you should start being screened again.  Some women have medical problems that increase their chance of getting cervical cancer. If this is the case for you, your health care provider may recommend more frequent screening and Pap tests.  The human papillomavirus (HPV) test is another test that may be used for cervical cancer screening. The HPV test looks for the virus that can cause cell changes in the cervix. The cells collected during the Pap test can be tested for HPV.  The HPV test can be used to screen women 67 years of age and older. Getting tested  for HPV can extend the interval between normal Pap tests from three to five years.  An HPV test also should be used to screen women of any age who have unclear Pap test results.  After 43 years of age, women should have HPV testing as often as Pap tests.  Colorectal Cancer  This type of cancer can be detected and often prevented.  Routine colorectal cancer screening usually begins at 43 years of age and continues through 43 years of age.  Your health care provider may recommend screening at an earlier age if you have risk factors for colon cancer.  Your health care provider may also recommend using home test kits to check for hidden blood in the stool.  A small camera at the end of a tube can be used to examine your colon directly (sigmoidoscopy or colonoscopy). This is done to check for the earliest forms  of colorectal cancer.  Routine screening usually begins at age 50.  Direct examination of the colon should be repeated every 5-10 years through 43 years of age. However, you may need to be screened more often if early forms of precancerous polyps or small growths are found. Skin Cancer  Check your skin from head to toe regularly.  Tell your health care provider about any new moles or changes in moles, especially if there is a change in a mole's shape or color.  Also tell your health care provider if you have a mole that is larger than the size of a pencil eraser.  Always use sunscreen. Apply sunscreen liberally and repeatedly throughout the [place].  Protect yourself by wearing long sleeves, pants, a wide-brimmed hat, and sunglasses whenever you are outside. HEART DISEASE, DIABETES, AND HIGH BLOOD PRESSURE   Have your blood pressure checked at least every 1-2 years. High blood pressure causes heart disease and increases the risk of stroke.  If you are between 55 years and 79 years old, ask your health care provider if you should take aspirin to prevent strokes.  Have regular  diabetes screenings. This involves taking a blood sample to check your fasting blood sugar level.  If you are at a normal weight and have a low risk for diabetes, have this test once every three years after 43 years of age.  If you are overweight and have a high risk for diabetes, consider being tested at a younger age or more often. PREVENTING INFECTION  Hepatitis B  If you have a higher risk for hepatitis B, you should be screened for this virus. You are considered at high risk for hepatitis B if:  You were born in a country where hepatitis B is common. Ask your health care provider which countries are considered high risk.  Your parents were born in a high-risk country, and you have not been immunized against hepatitis B (hepatitis B vaccine).  You have HIV or AIDS.  You use needles to inject street drugs.  You live with someone who has hepatitis B.  You have had sex with someone who has hepatitis B.  You get hemodialysis treatment.  You take certain medicines for conditions, including cancer, organ transplantation, and autoimmune conditions. Hepatitis C  Blood testing is recommended for:  Everyone born from 1945 through 1965.  Anyone with known risk factors for hepatitis C. Sexually transmitted infections (STIs)  You should be screened for sexually transmitted infections (STIs) including gonorrhea and chlamydia if:  You are sexually active and are younger than 43 years of age.  You are older than 43 years of age and your health care provider tells you that you are at risk for this type of infection.  Your sexual activity has changed since you were last screened and you are at an increased risk for chlamydia or gonorrhea. Ask your health care provider if you are at risk.  If you do not have HIV, but are at risk, it may be recommended that you take a prescription medicine daily to prevent HIV infection. This is called pre-exposure prophylaxis (PrEP). You are considered at  risk if:  You are sexually active and do not regularly use condoms or know the HIV status of your partner(s).  You take drugs by injection.  You are sexually active with a partner who has HIV. Talk with your health care provider about whether you are at high risk of being infected with HIV. If you choose to begin   PrEP, you should first be tested for HIV. You should then be tested every 3 months for as long as you are taking PrEP.  PREGNANCY   If you are premenopausal and you may become pregnant, ask your health care provider about preconception counseling.  If you may become pregnant, take 400 to 800 micrograms (mcg) of folic acid every [place].  If you want to prevent pregnancy, talk to your health care provider about birth control (contraception). OSTEOPOROSIS AND MENOPAUSE   Osteoporosis is a disease in which the bones lose minerals and strength with aging. This can result in serious bone fractures. Your risk for osteoporosis can be identified using a bone density scan.  If you are 30 years of age or older, or if you are at risk for osteoporosis and fractures, ask your health care provider if you should be screened.  Ask your health care provider whether you should take a calcium or vitamin D supplement to lower your risk for osteoporosis.  Menopause may have certain physical symptoms and risks.  Hormone replacement therapy may reduce some of these symptoms and risks. Talk to your health care provider about whether hormone replacement therapy is right for you.  HOME CARE INSTRUCTIONS   Schedule regular health, dental, and eye exams.  Stay current with your immunizations.   Do not use any tobacco products including cigarettes, chewing tobacco, or electronic cigarettes.  If you are pregnant, do not drink alcohol.  If you are breastfeeding, limit how much and how often you drink alcohol.  Limit alcohol intake to no more than 1 drink per [place] for nonpregnant women. One drink equals  12 ounces of beer, 5 ounces of wine, or 1 ounces of hard liquor.  Do not use street drugs.  Do not share needles.  Ask your health care provider for help if you need support or information about quitting drugs.  Tell your health care provider if you often feel depressed.  Tell your health care provider if you have ever been abused or do not feel safe at home. Document Released: 02/25/2011 Document Revised: 12/27/2013 Document Reviewed: 07/14/2013 [facility] Patient Information 2015 [place], [place]. This information is not intended to replace advice given to you by your health care provider. Make sure you discuss any questions you have with your health care provider.

## 2016-03-19 ENCOUNTER — Encounter: Payer: Self-pay | Admitting: Gynecology

## 2016-03-19 LAB — URINALYSIS W MICROSCOPIC + REFLEX CULTURE
Bacteria, UA: NONE SEEN [HPF]
Bilirubin Urine: NEGATIVE
Casts: NONE SEEN [LPF]
Crystals: NONE SEEN [HPF]
Glucose, UA: NEGATIVE
Hgb urine dipstick: NEGATIVE
Ketones, ur: NEGATIVE
Leukocytes, UA: NEGATIVE
Nitrite: NEGATIVE
Protein, ur: NEGATIVE
RBC / HPF: NONE SEEN RBC/HPF (ref <=2)
Specific Gravity, Urine: 1.019 (ref 1.001–1.035)
Yeast: NONE SEEN [HPF]
pH: 5.5 (ref 5.0–8.0)

## 2016-03-19 LAB — PAP IG AND HPV HIGH-RISK: HPV DNA High Risk: NOT DETECTED

## 2016-03-20 LAB — URINE CULTURE: Organism ID, Bacteria: 10000

## 2016-05-22 ENCOUNTER — Telehealth: Payer: Self-pay

## 2016-05-22 MED ORDER — FLUCONAZOLE 150 MG PO TABS: ORAL_TABLET | ORAL | 0 refills | Status: DC

## 2016-05-22 MED ORDER — METRONIDAZOLE 500 MG PO TABS: 500.0000 mg | ORAL_TABLET | Freq: Two times a day (BID) | ORAL | 0 refills | Status: DC

## 2016-05-22 NOTE — Telephone Encounter (Signed)
Patient said at office visit you told her you if she needed you would treat her over the phone for bacterial infection or yeast infection because of her history.  She said she is experiencing a bacterial infection and would like Rx.  Also, would like the two Diflucan tablets to take after she takes the Rx for bacterial infection because it usually causes a yeast infection.

## 2016-05-22 NOTE — Telephone Encounter (Signed)
Patient informed. Rx's sent. 

## 2016-05-22 NOTE — Telephone Encounter (Signed)
okay for Flagyl 500 mg twice a Carreon 7 days, or call avoidance while taking Diflucan 150 mg #2 one by mouth as needed for yeast

## 2016-09-26 ENCOUNTER — Telehealth: Payer: Self-pay | Admitting: Family Medicine

## 2016-09-26 MED ORDER — OSELTAMIVIR PHOSPHATE 75 MG PO CAPS
75.0000 mg | ORAL_CAPSULE | Freq: Two times a day (BID) | ORAL | 0 refills | Status: DC
Start: 2016-09-26 — End: 2016-10-01

## 2016-09-26 NOTE — Telephone Encounter (Signed)
Go ahead and: Tamiflu 75 mg twice a [place] for 5 days

## 2016-09-26 NOTE — Telephone Encounter (Signed)
Son diagnosed with Flu on Tuesday and now she has a fever, and has body aches.

## 2016-09-26 NOTE — Telephone Encounter (Signed)
Rx sent to pharmacy and husband aware.

## 2016-10-01 ENCOUNTER — Ambulatory Visit (INDEPENDENT_AMBULATORY_CARE_PROVIDER_SITE_OTHER): Payer: PRIVATE HEALTH INSURANCE | Admitting: Family Medicine

## 2016-10-01 ENCOUNTER — Encounter: Payer: Self-pay | Admitting: Family Medicine

## 2016-10-01 VITALS — BP 117/76 | HR 76 | Temp 97.9°F | Ht 64.0 in | Wt 192.8 lb

## 2016-10-01 DIAGNOSIS — R0683 Snoring: Secondary | ICD-10-CM

## 2016-10-01 DIAGNOSIS — Z13220 Encounter for screening for lipoid disorders: Secondary | ICD-10-CM | POA: Diagnosis not present

## 2016-10-01 DIAGNOSIS — B001 Herpesviral vesicular dermatitis: Secondary | ICD-10-CM | POA: Diagnosis not present

## 2016-10-01 DIAGNOSIS — R5382 Chronic fatigue, unspecified: Secondary | ICD-10-CM

## 2016-10-01 MED ORDER — VALACYCLOVIR HCL 500 MG PO TABS: 500.0000 mg | ORAL_TABLET | Freq: Every day | ORAL | 3 refills | Status: DC

## 2016-10-01 NOTE — Progress Notes (Signed)
BP 117/76   Pulse 76   Temp 97.9 F (36.6 C) (Oral)   Ht _0  (1.626 m)   Wt 192 lb 12.8 oz (87.5 kg)   BMI 33.09 kg/m    Subjective:    Patient ID: Mackenzie Meyers, female    DOB: [birth year], 46 y.o.   MRN: [id number]  HPI: Mackenzie Meyers is a 46 y.o. female presenting on 10/01/2016 for Medication Refill (fever blisters) and Labs Only (wants tested lupus, TSH)   HPI Fatigue and snoring Patient is coming in today for chronic complaints of fatigue and feeling lack of energy. She also says along with it she gets diffuse joint aches and pains that come and go in her knees and ankles and wrists and hips and back. She says she does not necessarily have those pains right now. The knees get worse when going upstairs and the wrist does get worse when she squeezing things but is not always consistent about when it's going to happen. The fatigue and lack of energy has been something that she has been dealing with for quite some time and she finally has insurance so she wants to come in and get it checked out. She says that she will sleep at night but then still has to take a nap in the afternoon on days that she is not working. She says even with taking a nap she doesn't have too many issues with falling asleep. She does admit that she snores quite loudly at night but does not know if she ever stops breathing while she snores. She says that she does wake up multiple times at night and does not feel like she gets deep refreshing sleep. She denies any chest pain or shortness of breath. She does have some feelings of anxiety and OCD and has been diagnosed with that in the past but feels like it's somewhat under control with her husband's support more recently. She would like to just be able to get to the bottom of everything that is going on. She also says that she gets headaches and about once a week she gets sharp one-sided headaches just above her right eye but then almost daily she gets squeezing pressure  headaches diffusely throughout her head.  Relevant past medical, surgical, family and social history reviewed and updated as indicated. Interim medical history since our last visit reviewed. Allergies and medications reviewed and updated.  Review of Systems  Constitutional: Positive for fatigue. Negative for chills and fever.  HENT: Negative for congestion, ear discharge and ear pain.   Eyes: Negative for redness and visual disturbance.  Respiratory: Negative for chest tightness and shortness of breath.   Cardiovascular: Negative for chest pain and leg swelling.  Endocrine: Negative for cold intolerance, heat intolerance, polydipsia and polyuria.  Genitourinary: Negative for difficulty urinating and dysuria.  Musculoskeletal: Positive for arthralgias. Negative for back pain and gait problem.  Skin: Negative for rash.  Neurological: Negative for light-headedness and headaches.  Psychiatric/Behavioral: Positive for sleep disturbance. Negative for agitation, behavioral problems, decreased concentration, dysphoric mood, self-injury and suicidal ideas. The patient is nervous/anxious.   All other systems reviewed and are negative.   Per HPI unless specifically indicated above     Objective:    BP 117/76   Pulse 76   Temp 97.9 F (36.6 C) (Oral)   Ht _1  (1.626 m)   Wt 192 lb 12.8 oz (87.5 kg)   BMI 33.09 kg/m   Wt Readings from Last  3 Encounters:  10/01/16 192 lb 12.8 oz (87.5 kg)  03/18/16 199 lb (90.3 kg)  02/20/16 198 lb (89.8 kg)    Physical Exam  Constitutional: She is oriented to person, place, and time. She appears well-developed and well-nourished. No distress.  HENT:  Right Ear: External ear normal.  Left Ear: External ear normal.  Nose: Nose normal.  Mouth/Throat: Oropharynx is clear and moist. No oropharyngeal exudate.  Eyes: Conjunctivae are normal.  Neck: Neck supple. No thyromegaly present.  Cardiovascular: Normal rate, regular rhythm, normal heart sounds and  intact distal pulses.   No murmur heard. Pulmonary/Chest: Effort normal and breath sounds normal. No respiratory distress. She has no wheezes. She has no rales.  Abdominal: Soft. Bowel sounds are normal. She exhibits no distension. There is no tenderness. There is no rebound.  Musculoskeletal: Normal range of motion. She exhibits no edema or tenderness (Unable to reproduce any joint tenderness on exam today.).  Lymphadenopathy:    She has no cervical adenopathy.  Neurological: She is alert and oriented to person, place, and time. No cranial nerve deficit. She exhibits normal muscle tone. Coordination normal.  Skin: Skin is warm and dry. No rash noted. She is not diaphoretic.  She does not have any cold sores today but just wants to get on it preventatively.  Psychiatric: She has a normal mood and affect. Her behavior is normal.  Nursing note and vitals reviewed.     Assessment & Plan:   Problem List Items Addressed This Visit    None    Visit Diagnoses    Chronic fatigue    -  Primary   Relevant Orders   ANA Comprehensive Panel   Ambulatory referral to Sleep Studies   CBC with Differential/Platelet   CMP14+EGFR   Arthritis Panel   VITAMIN D 25 Hydroxy (Vit-D Deficiency, Fractures)   TSH   Recurrent cold sores       Relevant Medications   valACYclovir (VALTREX) 500 MG tablet   Snoring       Relevant Orders   Ambulatory referral to Sleep Studies   Lipid screening       Relevant Orders   Lipid panel       Follow up plan: Return if symptoms worsen or fail to improve.  Counseling provided for all of the vaccine components Orders Placed This Encounter  Procedures  . ANA Comprehensive Panel  . CBC with Differential/Platelet  . CMP14+EGFR  . Lipid panel  . Arthritis Panel  . VITAMIN D 25 Hydroxy (Vit-D Deficiency, Fractures)  . TSH  . Ambulatory referral to Sleep Studies    Caryl Pina, MD [facility] 10/01/2016, 10:49 AM

## 2016-10-05 ENCOUNTER — Other Ambulatory Visit: Payer: Self-pay | Admitting: Family Medicine

## 2016-10-05 ENCOUNTER — Other Ambulatory Visit: Payer: PRIVATE HEALTH INSURANCE

## 2016-10-05 DIAGNOSIS — Z13220 Encounter for screening for lipoid disorders: Secondary | ICD-10-CM

## 2016-10-05 DIAGNOSIS — B001 Herpesviral vesicular dermatitis: Secondary | ICD-10-CM

## 2016-10-05 DIAGNOSIS — R5382 Chronic fatigue, unspecified: Secondary | ICD-10-CM

## 2016-10-07 ENCOUNTER — Other Ambulatory Visit: Payer: Self-pay | Admitting: *Deleted

## 2016-10-07 ENCOUNTER — Telehealth: Payer: Self-pay | Admitting: Family

## 2016-10-07 DIAGNOSIS — B001 Herpesviral vesicular dermatitis: Secondary | ICD-10-CM

## 2016-10-07 LAB — ARTHRITIS PANEL
Basophils Absolute: 0 10*3/uL (ref 0.0–0.2)
Basos: 1 %
EOS (ABSOLUTE): 0.1 10*3/uL (ref 0.0–0.4)
Eos: 2 %
Hematocrit: 43.6 % (ref 34.0–46.6)
Hemoglobin: 14.4 g/dL (ref 11.1–15.9)
Immature Grans (Abs): 0 10*3/uL (ref 0.0–0.1)
Immature Granulocytes: 0 %
Lymphocytes Absolute: 2.3 10*3/uL (ref 0.7–3.1)
Lymphs: 29 %
MCH: 27.9 pg (ref 26.6–33.0)
MCHC: 33 g/dL (ref 31.5–35.7)
MCV: 85 fL (ref 79–97)
Monocytes Absolute: 0.7 10*3/uL (ref 0.1–0.9)
Monocytes: 8 %
Neutrophils Absolute: 4.9 10*3/uL (ref 1.4–7.0)
Neutrophils: 60 %
Platelets: 307 10*3/uL (ref 150–379)
RBC: 5.16 x10E6/uL (ref 3.77–5.28)
RDW: 14.4 % (ref 12.3–15.4)
Rhuematoid fact SerPl-aCnc: <10 IU/mL (ref 0.0–13.9)
Sed Rate: 5 mm/hr (ref 0–32)
Uric Acid: 4.7 mg/dL (ref 2.5–7.1)
WBC: 8.1 10*3/uL (ref 3.4–10.8)

## 2016-10-07 LAB — CMP14+EGFR
ALT: 10 IU/L (ref 0–32)
AST: 14 IU/L (ref 0–40)
Albumin/Globulin Ratio: 1.8 (ref 1.2–2.2)
Albumin: 4.3 g/dL (ref 3.5–5.5)
Alkaline Phosphatase: 69 IU/L (ref 39–117)
BUN/Creatinine Ratio: 16 (ref 9–23)
BUN: 11 mg/dL (ref 6–24)
Bilirubin Total: 0.4 mg/dL (ref 0.0–1.2)
CO2: 23 mmol/L (ref 18–29)
Calcium: 9.1 mg/dL (ref 8.7–10.2)
Chloride: 101 mmol/L (ref 96–106)
Creatinine, Ser: 0.7 mg/dL (ref 0.57–1.00)
GFR calc Af Amer: 123 mL/min/{1.73_m2} (ref >59)
GFR calc non Af Amer: 106 mL/min/{1.73_m2} (ref >59)
Globulin, Total: 2.4 g/dL (ref 1.5–4.5)
Glucose: 86 mg/dL (ref 65–99)
Potassium: 4.7 mmol/L (ref 3.5–5.2)
Sodium: 140 mmol/L (ref 134–144)
Total Protein: 6.7 g/dL (ref 6.0–8.5)

## 2016-10-07 LAB — VITAMIN D 25 HYDROXY (VIT D DEFICIENCY, FRACTURES): Vit D, 25-Hydroxy: 20.9 ng/mL — ABNORMAL LOW (ref 30.0–100.0)

## 2016-10-07 LAB — LIPID PANEL
Chol/HDL Ratio: 3.7 ratio units (ref 0.0–4.4)
Cholesterol, Total: 162 mg/dL (ref 100–199)
HDL: 44 mg/dL (ref >39)
LDL Calculated: 107 mg/dL — ABNORMAL HIGH (ref 0–99)
Triglycerides: 55 mg/dL (ref 0–149)
VLDL Cholesterol Cal: 11 mg/dL (ref 5–40)

## 2016-10-07 LAB — TSH: TSH: 1.78 u[IU]/mL (ref 0.450–4.500)

## 2016-10-07 MED ORDER — VALACYCLOVIR HCL 500 MG PO TABS: 500.0000 mg | ORAL_TABLET | Freq: Every day | ORAL | 3 refills | Status: DC

## 2016-10-07 NOTE — Telephone Encounter (Signed)
Rx sent to pharmacy   

## 2016-10-21 ENCOUNTER — Other Ambulatory Visit: Payer: Self-pay | Admitting: Family Medicine

## 2016-10-22 ENCOUNTER — Institutional Professional Consult (permissible substitution): Payer: PRIVATE HEALTH INSURANCE | Admitting: Neurology

## 2016-10-24 ENCOUNTER — Encounter: Payer: Self-pay | Admitting: Neurology

## 2016-10-24 ENCOUNTER — Ambulatory Visit (INDEPENDENT_AMBULATORY_CARE_PROVIDER_SITE_OTHER): Payer: PRIVATE HEALTH INSURANCE | Admitting: Neurology

## 2016-10-24 VITALS — BP 118/68 | HR 88 | Resp 16 | Ht 64.0 in | Wt 192.0 lb

## 2016-10-24 DIAGNOSIS — R0683 Snoring: Secondary | ICD-10-CM | POA: Diagnosis not present

## 2016-10-24 DIAGNOSIS — M545 Low back pain, unspecified: Secondary | ICD-10-CM

## 2016-10-24 DIAGNOSIS — G4719 Other hypersomnia: Secondary | ICD-10-CM | POA: Diagnosis not present

## 2016-10-24 DIAGNOSIS — G4761 Periodic limb movement disorder: Secondary | ICD-10-CM | POA: Diagnosis not present

## 2016-10-24 NOTE — Progress Notes (Signed)
SLEEP MEDICINE CLINIC   Provider:  Larey Seat, M D  Referring Provider: Sharion Balloon, FNP Primary Care Physician:  Worthy Rancher, MD  Chief Complaint  Patient presents with  . Sleep Consult    Rm 10. Patient has trouble falling asleep, wakes up often, snores, wakes up feeling tired, daytime fatigue, takes naps.     HPI:  Mackenzie Meyers is a 46 y.o. female , seen here as a referral from Dr. Warrick Parisian, NP  Lenna Gilford for a sleep evaluation,  According to the referral notes the patient is concerned about fatigue and her feeling of lacking energy. She feels exhausted many days she has diffuse joint aches and pains and has been seen by primary care and rheumatology, has had lupus testing and thyroid checks. [name] has been acutely developing but rather gradually over time. For many years she lacked insurance so she couldn't have it checked out. She will sleep at night but still will have the need to take naps in the daytime on those days where she is not working. He was taking a nap she doesn't have issues to fall asleep at night. Her husband has told her that she is snores loudly, she wakes up multiple times at night it does not feel like she gets restorative sleep. She has a history of some anxiety and OCD but feels it is somewhat under control and she denies chest pain, palpitations, diaphoresis or dizziness. Once a week she may wake up with headaches but she has many other days with headaches. The headaches do not wake her up. Headaches are usually located above the right eye and temple.  She had given birth to her third child 37 month ago, at age 65 , but fatigue proceeded the pregnancy.   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 Her youngest daughter naps at 8 AM and she takes a nap alongside for 1-1.5 hours .  She may sleep up to 3 hours while  the youngest,  [name],  is in daycare. She has no trouble sleeping again at her  nocturnal Bedtime, which  is between 9 and 9:30 PM. Her marital bedroom is cool , quiet and dark. Dog " Abby"  sleeps in the bedroom, but not in the bed.  She prefers to sleep on her side, on one pillow.  Sleep medical history and family sleep history: brother and father are using CPAP for OSA.  No enuresis, sleep walking, no TBI  . Oldest son had enuresis and night terrors.   Social history: She is a 25 year history of smoking and recently relapsed. She is determined to quit this month. Very seldomly drinks alcohol, caffeine use: One caffeinated soda a [place], only decaffeinated  ice tea and coffee, 1-2 a [place]. 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    Review of Systems: Out of a complete 14 system review, the patient complains of only the following symptoms, and all other reviewed systems are negative.  The patient reports fatigue, snoring, joint pain, anxiety, decreased level of energy possible apnea always feeling tired napping daily, she also has vitiligo, history of OCD, migraine, IIIc injections, tonsillectomy in the year 2000 as an adult, abdominoplasty 2008 surgery of the left breast 2008  Epworth score 15 , Fatigue severity score 48  , depression score 4/15.  Social History   Social History  . Marital status: Married    Spouse name: N/A  . Number of children: 3  . Years of education: N/A   Occupational History  . Not on file.   Social History Main Topics  . Smoking status: Current Every Evers Smoker    Packs/Duffner: 1.00    Last attempt to quit: 08/17/2012  . Smokeless tobacco: Never Used     Comment: restarted 10/17 d/t husband losing job  . Alcohol use Yes     Comment: rare, twice a year  . Drug use: No  . Sexual activity: Yes    Partners: Male    Birth control/ protection: IUD     Comment: Mirena inserted 03-2015-1st intercourse 46 yo-More than 5 partners   Other Topics Concern  . Not  on file   Social History Narrative   Dinks 1 soda a Ober     Family History  Problem Relation Age of Onset  . Cancer Maternal Grandfather     lymphoma  . Cancer Paternal Grandfather     liver  . Lung cancer Maternal Grandmother     and maternal uncle  . Cancer Maternal Uncle     Lung cancer  . Hyperlipidemia Other     Past Medical History:  Diagnosis Date  . Anxiety   . Arthritis    hands  . Breast mass, right 09/2012  . Cervical dysplasia age 70  . GDM (gestational diabetes mellitus)   . Geographical tongue   . 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 History of kidney stones 10/2011  . 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 OCD (obsessive compulsive disorder)   . Rosacea   . Sinus congestion 10/12/2012  . Vitamin D deficiency 10/2013   Value 29  . Vitiligo     Past Surgical History:  Procedure Laterality Date  . ABDOMINOPLASTY  2008  . BREAST BIOPSY Right 10/15/2012   Procedure: BREAST BIOPSY;  Surgeon: Haywood Lasso, MD;  Location: [facility];  Service: General;  Laterality: Right;  removal right breast mass  . BREAST SURGERY     Lift  . CESAREAN SECTION  2004, 2007  . CESAREAN SECTION N/A 02/07/2015   Procedure: CESAREAN SECTION;  Surgeon: Jerelyn Charles, MD;  Location: [facility];  Service: Obstetrics;  Laterality: N/A;  EDD: 02/13/15   . dialation and cutterage    . GYNECOLOGIC CRYOSURGERY  age 68  . INTRAUTERINE DEVICE INSERTION  03/2015   Mirena at [place]  . TONSILLECTOMY  2000    Current Outpatient Prescriptions  Medication Sig Dispense Refill  . acetaminophen (TYLENOL) 500 MG tablet Take 1,000 mg by mouth every 6 (six) hours as needed for mild pain, moderate pain or headache. Reported on 02/20/2016    . ibuprofen (ADVIL,MOTRIN) 200 MG tablet Take 200 mg by mouth every 6 (six) hours as needed. Patient takes 4 at a time    . levonorgestrel (MIRENA) 20 MCG/24HR IUD 1  each by Intrauterine route once.    . valACYclovir (VALTREX) 500 MG tablet Take 1 tablet (500 mg total) by mouth daily. 90 tablet 3   No current facility-administered medications for this visit.     Allergies as of 10/24/2016 - Review Complete 10/24/2016  Allergen Reaction Noted  . Ampicillin Hives 03/25/2011  . Erythromycin Nausea And Vomiting 11/04/2011  . Other Hives 10/12/2012  . Ciprofloxacin Hives and  Rash 10/27/2012    Vitals: BP 118/68   Pulse 88   Resp 16   Ht 5' 4"  (1.626 m)   Wt 192 lb (87.1 kg)   BMI 32.96 kg/m  Last Weight:  Wt Readings from Last 1 Encounters:  10/24/16 192 lb (87.1 kg)   QPY:PPJK mass index is 32.96 kg/m.     Last Height:   Ht Readings from Last 1 Encounters:  10/24/16 5' 4"  (1.626 m)    Physical exam:  General: The patient is awake, alert and appears not in acute distress. The patient is well groomed. Head: Normocephalic, atraumatic. Neck is supple. Mallampati 3,  neck circumference 2. Nasal airflow patent ,  Cardiovascular:  Regular rate and rhythm, without  murmurs or carotid bruit, and without distended neck veins. Respiratory: Lungs are not clear to auscultation- mild wheezing, rhonci . Skin:  Without evidence of edema, or rash Trunk: BMI is 33. The patient's posture is erect  Neurologic exam : The patient is awake and alert, oriented to place and time.   Mood and affect are appropriate.  Cranial nerves: Pupils are equal and briskly reactive to light. Funduscopic exam without  evidence of pallor or edema.  Prescription glasses in place. Extraocular movements  in vertical and horizontal planes intact and without nystagmus. Visual fields by finger perimetry are intact.Hearing to finger rub intact. Facial sensation intact to fine touch. Facial motor strength is symmetric and tongue and uvula move midline. Shoulder shrug was symmetrical.   Motor exam: Normal tone, muscle bulk and symmetric strength in all extremities. Sensory:  Fine  touch, pinprick and vibration were tested in all extremities. Proprioception tested in the upper extremities was normal. Coordination: Rapid alternating movements / Finger-to-nose maneuver are normal without evidence of ataxia, dysmetria or tremor. Gait and station: Patient walks without assistive device and is able unassisted to climb up to the exam table. Strength within normal limits.Stance is stable and normal.   Deep tendon reflexes: in the  upper and lower extremities are symmetric, rather brisk-  Babinski maneuver deferred.  The patient was advised of the nature of the diagnosed sleep disorder , the treatment options and risks for general a health and wellness arising from not treating the condition.  I spent more than 35  minutes of face to face time with the patient. Greater than 50% of time was spent in counseling and coordination of care. We have discussed the diagnosis and differential and I answered the patient's questions.     Assessment:  After physical and neurologic examination, review of laboratory studies,  Personal review of imaging studies, reports of other /same  Imaging studies ,  Results of polysomnography/ neurophysiology testing and pre-existing records as far as provided in visit., my assessment is   1)  Mrs. Schnelle reports that she is a loud snorer and has been told so but she is unsure if affected by apnea.her husband has felt her breathing is irregular.  He noted her to kick a lot during sleep , but not to enact dreams.   The  46 year old sleeps through the night, her TSH was nl. , Vitamin D was low , and Lupus tests were nl/ negative,  Her degree of fatigue is higher than her degree of daytime sleepiness. However her degree of daytime sleepiness is elevated above her by mouth group or what is considered normal. She endorsed 15 points out of 24 . She does not recall vivid dreams, sleep paralysis or dream intrusions. Her  need for daytime naps is also very high. I will invite  her to be screened for obstructive sleep apnea and if possible treated the same [place]. I do think that it is better and more revealing if she can have a sleep study and an attended setting as a home sleep test does not tell me if she truly sleeps. I would like to see in which stages of sleep she has trouble to maintain sleep and if she has periodic limb movements, irregular heartbeats or other physiologic interruptions leading to arousals from her usual sleep.   HYPERSOMNIA, SNORING, VIT D deficiency, RLS with PLM   Plan:  Treatment plan and additional workup :  SPLIT , at AHI 15, RV after sleep test      Larey Seat MD  10/24/2016   CC: Sharion Balloon, [address] [place], [place] [id number]

## 2016-10-24 NOTE — Patient Instructions (Signed)
Back Exercises If you have pain in your back, do these exercises 2-3 times each [place] or as told by your doctor. When the pain goes away, do the exercises once each [place], but repeat the steps more times for each exercise (do more repetitions). If you do not have pain in your back, do these exercises once each [place] or as told by your doctor. Exercises Single Knee to Chest   Do these steps 3-5 times in a row for each leg: 1. Lie on your back on a firm bed or the floor with your legs stretched out. 2. Bring one knee to your chest. 3. Hold your knee to your chest by grabbing your knee or thigh. 4. Pull on your knee until you feel a gentle stretch in your lower back. 5. Keep doing the stretch for 10-30 seconds. 6. Slowly let go of your leg and straighten it. Pelvic Tilt   Do these steps 5-10 times in a row: 1. Lie on your back on a firm bed or the floor with your legs stretched out. 2. Bend your knees so they point up to the ceiling. Your feet should be flat on the floor. 3. Tighten your lower belly (abdomen) muscles to press your lower back against the floor. This will make your tailbone point up to the ceiling instead of pointing down to your feet or the floor. 4. Stay in this position for 5-10 seconds while you gently tighten your muscles and breathe evenly. Cat-Cow   Do these steps until your lower back bends more easily: 1. Get on your hands and knees on a firm surface. Keep your hands under your shoulders, and keep your knees under your hips. You may put padding under your knees. 2. Let your head hang down, and make your tailbone point down to the floor so your lower back is round like the back of a cat. 3. Stay in this position for 5 seconds. 4. Slowly lift your head and make your tailbone point up to the ceiling so your back hangs low (sags) like the back of a cow. 5. Stay in this position for 5 seconds. Press-Ups   Do these steps 5-10 times in a row: 1. Lie on your belly (face-down) on  the floor. 2. Place your hands near your head, about shoulder-width apart. 3. While you keep your back relaxed and keep your hips on the floor, slowly straighten your arms to raise the top half of your body and lift your shoulders. Do not use your back muscles. To make yourself more comfortable, you may change where you place your hands. 4. Stay in this position for 5 seconds. 5. Slowly return to lying flat on the floor. Bridges   Do these steps 10 times in a row: 1. Lie on your back on a firm surface. 2. Bend your knees so they point up to the ceiling. Your feet should be flat on the floor. 3. Tighten your butt muscles and lift your butt off of the floor until your waist is almost as high as your knees. If you do not feel the muscles working in your butt and the back of your thighs, slide your feet 1-2 inches farther away from your butt. 4. Stay in this position for 3-5 seconds. 5. Slowly lower your butt to the floor, and let your butt muscles relax. If this exercise is too easy, try doing it with your arms crossed over your chest. Belly Crunches   Do these steps 5-10 times   in a row: 1. Lie on your back on a firm bed or the floor with your legs stretched out. 2. Bend your knees so they point up to the ceiling. Your feet should be flat on the floor. 3. Cross your arms over your chest. 4. Tip your chin a little bit toward your chest but do not bend your neck. 5. Tighten your belly muscles and slowly raise your chest just enough to lift your shoulder blades a tiny bit off of the floor. 6. Slowly lower your chest and your head to the floor. Back Lifts  Do these steps 5-10 times in a row: 1. Lie on your belly (face-down) with your arms at your sides, and rest your forehead on the floor. 2. Tighten the muscles in your legs and your butt. 3. Slowly lift your chest off of the floor while you keep your hips on the floor. Keep the back of your head in line with the curve in your back. Look at the  floor while you do this. 4. Stay in this position for 3-5 seconds. 5. Slowly lower your chest and your face to the floor. Contact a doctor if:  Your back pain gets a lot worse when you do an exercise.  Your back pain does not lessen 2 hours after you exercise. If you have any of these problems, stop doing the exercises. Do not do them again unless your doctor says it is okay. Get help right away if:  You have sudden, very bad back pain. If this happens, stop doing the exercises. Do not do them again unless your doctor says it is okay. This information is not intended to replace advice given to you by your health care provider. Make sure you discuss any questions you have with your health care provider. Document Released: 09/14/2010 Document Revised: 01/18/2016 Document Reviewed: 10/06/2014 Elsevier Interactive Patient Education  2017 Elsevier Inc.  

## 2016-10-31 ENCOUNTER — Ambulatory Visit (INDEPENDENT_AMBULATORY_CARE_PROVIDER_SITE_OTHER): Payer: PRIVATE HEALTH INSURANCE | Admitting: Nurse Practitioner

## 2016-10-31 ENCOUNTER — Encounter: Payer: Self-pay | Admitting: Nurse Practitioner

## 2016-10-31 VITALS — BP 106/62 | HR 90 | Temp 98.2°F | Ht 64.0 in | Wt 187.0 lb

## 2016-10-31 DIAGNOSIS — J020 Streptococcal pharyngitis: Secondary | ICD-10-CM | POA: Diagnosis not present

## 2016-10-31 DIAGNOSIS — R52 Pain, unspecified: Secondary | ICD-10-CM

## 2016-10-31 LAB — VERITOR FLU A/B WAIVED
Influenza A: NEGATIVE
Influenza B: NEGATIVE

## 2016-10-31 LAB — RAPID STREP SCREEN (MED CTR MEBANE ONLY): Strep Gp A Ag, IA W/Reflex: POSITIVE — AB

## 2016-10-31 MED ORDER — CEFDINIR 300 MG PO CAPS: 300.0000 mg | ORAL_CAPSULE | Freq: Two times a day (BID) | ORAL | 0 refills | Status: DC

## 2016-10-31 NOTE — Patient Instructions (Signed)

## 2016-10-31 NOTE — Progress Notes (Signed)
   Subjective:    Patient ID: Mackenzie Meyers, female    DOB: 1970/08/27, 46 y.o.   MRN: [id number]  Fever   This is a new problem. The current episode started yesterday. The problem occurs intermittently. The problem has been gradually worsening. The maximum temperature noted was 102 to 102.9 F. The temperature was taken using an oral thermometer. Associated symptoms include coughing, headaches, muscle aches, nausea, a sore throat and vomiting. She has tried nothing for the symptoms.      Review of Systems  Constitutional: Positive for fever.  HENT: Positive for sore throat.   Respiratory: Positive for cough.   Cardiovascular: Negative.   Gastrointestinal: Positive for nausea and vomiting.  Genitourinary: Negative.   Neurological: Positive for headaches.  Psychiatric/Behavioral: Negative.   All other systems reviewed and are negative.      Objective:   Physical Exam  Constitutional: She appears well-developed and well-nourished. No distress.  HENT:  Right Ear: Hearing, tympanic membrane, external ear and ear canal normal.  Left Ear: Hearing, tympanic membrane, external ear and ear canal normal.  Nose: Mucosal edema and rhinorrhea present. Right sinus exhibits no frontal sinus tenderness. Left sinus exhibits no maxillary sinus tenderness and no frontal sinus tenderness.  Mouth/Throat: Uvula is midline. Posterior oropharyngeal erythema (mild) present.  Neck: Normal range of motion. Neck supple.  Cardiovascular: Normal rate and regular rhythm.   Pulmonary/Chest: Effort normal and breath sounds normal.  Abdominal: Soft. Bowel sounds are normal.  Lymphadenopathy:    She has no cervical adenopathy.  Neurological: She is alert.  Skin: Skin is warm.  Psychiatric: She has a normal mood and affect. Her behavior is normal. Judgment and thought content normal.   BP 106/62   Pulse 90   Temp 98.2 F (36.8 C) (Oral)   Ht 5\' 4"  (1.626 m)   Wt 187 lb (84.8 kg)   BMI 32.10 kg/m   Flu  negative Strep positive    Assessment & Plan:  1. Body aches - Rapid strep screen (not at [facility]) - Veritor Flu A/B Waived  2. Strep pharyngitis Force fluids Motrin or tylenol OTC OTC decongestant Throat lozenges if help New toothbrush in 3 days  Mary-Margaret Hassell Done, FNP

## 2016-11-02 ENCOUNTER — Telehealth: Payer: Self-pay | Admitting: Family Medicine

## 2016-11-04 MED ORDER — FLUCONAZOLE 150 MG PO TABS: 150.0000 mg | ORAL_TABLET | Freq: Once | ORAL | 0 refills | Status: AC

## 2016-11-04 NOTE — Telephone Encounter (Signed)
Patient notified of advice from Dr. Warrick Parisian, and that [place] sent.

## 2016-11-04 NOTE — Telephone Encounter (Signed)
Any antibiotic can cause diarrhea, please have her finish the course of the antibiotic, she can take [place] yogurt or a probiotic to help deterr her the diarrhea. She can also take Imodium. Please send 2 doses of Diflucan 150 mg for her

## 2016-11-04 NOTE — Telephone Encounter (Signed)
Pt has had diarrhea and no appetite since starting the [place].  Some abd pain today but more than likely d/t bouts of diarrhea. No OTC's for the diarrhea. Sore throat is getting better. Wants something else called in Hx of yeast infections after abx Can 2 diflucans be sent to pharmacy

## 2016-11-15 ENCOUNTER — Ambulatory Visit (INDEPENDENT_AMBULATORY_CARE_PROVIDER_SITE_OTHER): Payer: PRIVATE HEALTH INSURANCE | Admitting: Neurology

## 2016-11-15 DIAGNOSIS — G4733 Obstructive sleep apnea (adult) (pediatric): Secondary | ICD-10-CM

## 2016-11-15 DIAGNOSIS — G4719 Other hypersomnia: Secondary | ICD-10-CM

## 2016-11-15 DIAGNOSIS — R0683 Snoring: Secondary | ICD-10-CM

## 2016-11-15 DIAGNOSIS — G4761 Periodic limb movement disorder: Secondary | ICD-10-CM

## 2016-11-20 NOTE — Procedures (Signed)
PATIENT'S NAME:  Mackenzie Meyers, Mackenzie Meyers DOB:      03-May-1971      MR#:    [id number]     DATE OF RECORDING: 11/15/2016 REFERRING M.D.:  Christy A. Lenna Gilford,  FNP Study Performed:   Baseline Polysomnogram HISTORY:  Mackenzie Meyers is a 45 y.o. female patient, seen here as a referral from Dr. Caryl Pina, and NP  [facility] for a sleep evaluation.  According to the referral notes the patient is concerned about fatigue and lacking energy. She feels exhausted, she has diffuse joint aches and pains and has been seen by primary care and rheumatology, has had lupus testing and thyroid checks. She has the need to take naps in the daytime. Her husband has told her that she is snores loudly; she wakes up multiple times, does not feel like she gets restorative sleep. We evaluate for an organic sleep disorder.   The patient endorsed the Epworth Sleepiness Scale at 15/ 24 points.  FSS 48,  GDS 4/15   The patient's weight 192 pounds with a height of 64 (inches), resulting in a BMI of 32.7 kg/m2.  CURRENT MEDICATIONS: Tylenol, Motrin, Mirena IUD, Valtrex   PROCEDURE:  This is a multichannel digital polysomnogram utilizing the Somnostar 11.2 system.  Electrodes and sensors were applied and monitored per AASM Specifications.   EEG, EOG, Chin and Limb EMG, were sampled at 200 Hz.  ECG, Snore and Nasal Pressure, Thermal Airflow, Respiratory Effort, CPAP Flow and Pressure, Oximetry was sampled at 50 Hz. Digital video and audio were recorded.      BASELINE STUDY Lights Out was at 00:03 and Lights On at 06:59.  Total recording time (TRT) was 416.5 minutes, with a total sleep time (TST) of 329 minutes.   The patient's sleep latency was 24.5 minutes.  REM latency was 142 minutes.  The sleep efficiency was 79.0 %.     SLEEP ARCHITECTURE: WASO (Wake after sleep onset) was 62.5 minutes.  There were 4.5 minutes in Stage N1, 232.5 minutes Stage N2, 65 minutes Stage N3 and 27 minutes in Stage REM.  The percentage of Stage N1 was 1.4%, Stage N2  was 70.7%, Stage N3 was 19.8% and Stage R (REM sleep) was 8.2%.   RESPIRATORY ANALYSIS:  There were 35 respiratory events:  20 obstructive apneas, 0 central apneas and 15 hypopneas with 0 respiratory event related arousals (RERAs).   The total APNEA/HYPOPNEA INDEX (AHI) was 6.4/hour and the total RESPIRATORY DISTURBANCE INDEX was 6.4 /hour.  15 events occurred in REM sleep and 24 events in NREM. The REM AHI was 33.3 /hour, versus a non-REM AHI of 4.. The patient spent 87.5 minutes of total sleep time in the supine position and 242 minutes in non-supine. The supine AHI was 13.1 versus a non-supine AHI of 3.9.  OXYGEN SATURATION & C02:  The Wake baseline 02 saturation was 96%, with the lowest being 85%. Time spent below 89% saturation equaled 2 minutes.   PERIODIC LIMB MOVEMENTS:  The patient had a total of 0 Periodic Limb Movements.  The arousals were noted as: 110 were spontaneous, 0 were associated with PLMs, and 24 were associated with respiratory events.  Audio and video analysis did not show any abnormal or unusual movements, behaviors, phonations or vocalizations.  Mild to moderate Snoring was noted. The patient took one bathroom break. EKG was in keeping with normal sinus rhythm (NSR).   IMPRESSION:  1. Very mild Obstructive Sleep Apnea (OSA) with an AHI of 6.4 /hr. REM  accentuated AHI was 33.3 and supine AHI was 13.1/hr. 2. No arousals through Periodic Limb Movements (PLMD) 3. Primary Snoring 4. Sleep efficiency was 79%.   RECOMMENDATIONS:  I recommend to follow up with a CPAP titration in order to treat mild, REM dependent sleep apnea because of the high Epworth score at 15 points.  The patient should avoid supine sleep.  Weight loss is recommended to a BMI of 26.   1. Advise full-night, attended, CPAP titration study to optimize therapy.   2. Further information regarding OSA may be obtained from [employer] (www.sleepfoundation.org) or American Sleep Apnea  Association (www.sleepapnea.org). 3. I recommend dedicated sleep psychology referral for insomnia concern.   4. A follow up appointment will be scheduled in the Sleep Clinic at [facility]. The referring provider will be notified of the results.      I certify that I have reviewed the entire raw data recording prior to the issuance of this report in accordance with the Standards of Accreditation of the [place] Academy of Sleep Medicine (AASM)    Larey Seat, MD  11-20-2016 Diplomat, American Board of Psychiatry and Neurology  Diplomat, American Board of [place] Director, [place] [facility] [employer]

## 2016-11-20 NOTE — Addendum Note (Signed)
Addended by: Larey Seat on: 11/20/2016 12:41 PM   Modules accepted: Orders

## 2016-11-21 ENCOUNTER — Telehealth: Payer: Self-pay

## 2016-11-21 NOTE — Telephone Encounter (Signed)
I called pt. I advised her that Dr. Brett Fairy found very mild osa but a REM accentuation was seen and therefore, Dr. Brett Fairy recommends that pt have a cpap titration study to treat this kind of apnea. I advised pt to avoid sleeping on her back. Pt is agreeable to a cpap titration study and knows that our sleep lab will call her to discuss set up. Pt verbalized understanding of results. Pt had no questions at this time but was encouraged to call back if questions arise.

## 2016-11-21 NOTE — Telephone Encounter (Signed)
-----   Message from Larey Seat, MD sent at 11/20/2016 12:41 PM EDT ----- IMPRESSION:  1. Very mild Obstructive Sleep Apnea (OSA) with an AHI of 6.4 /hr. REM accentuated AHI was 33.3 and supine AHI was 13.1/hr. 2. No arousals through Periodic Limb Movements (PLMD) 3. Primary Snoring 4. Sleep efficiency was 79%.   RECOMMENDATIONS:  I recommend a follow up CPAP titration in order to treat mild, REM dependent sleep apnea because of the high Epworth score at 15 points.  The patient should avoid supine sleep.  Weight loss is recommended to a BMI of 26.

## 2016-11-21 NOTE — Telephone Encounter (Signed)
I called pt to discuss her sleep study results. No answer, left a message asking her to call me back. 

## 2016-11-21 NOTE — Telephone Encounter (Signed)
Pt returned RN's call °

## 2016-12-06 ENCOUNTER — Ambulatory Visit (INDEPENDENT_AMBULATORY_CARE_PROVIDER_SITE_OTHER): Payer: PRIVATE HEALTH INSURANCE | Admitting: Neurology

## 2016-12-06 DIAGNOSIS — G4719 Other hypersomnia: Secondary | ICD-10-CM

## 2016-12-06 DIAGNOSIS — R0683 Snoring: Secondary | ICD-10-CM

## 2016-12-06 DIAGNOSIS — G4733 Obstructive sleep apnea (adult) (pediatric): Secondary | ICD-10-CM | POA: Diagnosis not present

## 2016-12-10 NOTE — Addendum Note (Signed)
Addended by: Larey Seat on: 12/10/2016 02:27 PM   Modules accepted: Orders

## 2016-12-10 NOTE — Procedures (Signed)
PATIENT'S NAME:  Mackenzie Meyers, Mackenzie Meyers DOB:      Dec 25, 1970      MR#:    [id number]     DATE OF RECORDING: 12/06/2016 REFERRING M.D.:  Sharion Balloon, FNP Study Performed:   CPAP  Titration HISTORY: Results from a PSG study on 11/15/16 confirmed the presence of mild OSA in this female patient , presenting for severe hypersomnia.  The total APNEA/HYPOPNEA INDEX (AHI) was 6.4/hour and REM AHI was 33.3 /hour,. The Wake baseline 02 saturation was 96%, with the lowest being 85%. Time spent below 89% saturation equaled 2 minutes.  The patient endorsed the Epworth Sleepiness Scale at 15/24 points . The patient's weight 192 pounds with a height of 64 (inches), resulting in a BMI of 32.7 kg/m2. The patient's neck circumference measured 16 inches.  CURRENT MEDICATIONS: Tylenol, Motrin, Mirena, Valtrex   PROCEDURE:  This is a multichannel digital polysomnogram utilizing the SomnoStar 11.2 system.  Electrodes and sensors were applied and monitored per AASM Specifications.   EEG, EOG, Chin and Limb EMG, were sampled at 200 Hz.  ECG, Snore and Nasal Pressure, Thermal Airflow, Respiratory Effort, CPAP Flow and Pressure, Oximetry was sampled at 50 Hz. Digital video and audio were recorded.      CPAP was initiated at 5 cm H20 with heated humidity per AASM split night standards and pressure was advanced to 6/6cmH20 because of hypopneas, apneas and desaturations.  At a PAP pressure of 6 cmH20, there was a reduction of the AHI to 0.0 with improvement of the above symptoms of obstructive sleep apnea.    Lights Out was at 22:25 and Lights On at 05:06. Total recording time (TRT) was 401.5 minutes, with a total sleep time (TST) of 367.5 minutes. The patient's sleep latency was 16.5 minutes with 0.5 minutes of wake time after sleep onset. REM latency was 165.5 minutes.  The sleep efficiency was 91.5 %.    SLEEP ARCHITECTURE: WASO (Wake after sleep onset) was 17 minutes.  There were 13.5 minutes in Stage N1, 296 minutes Stage N2,  0 minutes Stage N3 and 58 minutes in Stage REM.  The percentage of Stage N1 was 3.7%, Stage N2 was 80.5%, Stage N3 was 0% and Stage R (REM sleep) was 15.8%. The sleep architecture was notable for REM rebound  RESPIRATORY ANALYSIS:  There were 0 respiratory events: The total APNEA/HYPOPNEA INDEX (AHI) was 0 /hour. The patient spent 84 minutes of total sleep time in the supine position and 284 minutes in non-supine. The supine AHI was 0.0, versus a non-supine AHI of 0.0.  OXYGEN SATURATION & C02:  The baseline 02 saturation was 96%, with the lowest being 92%. Time spent below 89% saturation equaled 0 minutes.  PERIODIC LIMB MOVEMENTS:    The patient had a total of 26 Periodic Limb Movements. The Periodic Limb Movement (PLM) index was 4.2 and the PLM Arousal index was 1.3 /hour. The arousals were noted as: 30 were spontaneous, 8 were associated with PLMs, and 0 were associated with respiratory events.  Audio and video analysis did not show any abnormal or unusual movements, behaviors, phonations or vocalizations.  The patient did not take bathroom breaks. No Snoring was noted. EKG was in keeping with normal sinus rhythm (NSR). The patient was fitted with a ResMed AirFit P10 (Small) nasal pillow apparatus.  DIAGNOSIS Mild but REM dependent OSA was alleviated under low pressure CPAP at 6 cm water with the use of nasal pillows. AirFit P10 in small size was sued with  the CPAP and with heated humidification, The CPAP  will be ordered.    A follow up appointment will be scheduled in the Sleep Clinic at [facility].   Please call [phone] with any questions.     I certify that I have reviewed the entire raw data recording prior to the issuance of this report in accordance with the Standards of Accreditation of the American Academy of Sleep Medicine (AASM)    Larey Seat, M.D.  12-10-2016 Diplomat, American Board of Psychiatry and Neurology  Diplomat, [place] of Sleep  Medicine Medical Director, [place] [facility]

## 2016-12-11 ENCOUNTER — Telehealth: Payer: Self-pay

## 2016-12-11 NOTE — Telephone Encounter (Signed)
I spoke to patient and she is willing to start CPAP. I will send orders to [facility]. I will send report to PCP. Patient was able to make f/u appt.

## 2016-12-11 NOTE — Telephone Encounter (Signed)
-----   Message from Larey Seat, MD sent at 12/10/2016  2:27 PM EDT ----- DIAGNOSIS Mild, but REM dependent OSA was alleviated under low pressure CPAP at 6 cm water with the use of nasal pillows. AirFit P10 in small size was sued with the CPAP and with heated humidification, The CPAP  will be ordered.    A follow up appointment will be scheduled in the Sleep Clinic at [facility].   Please call [phone] with any questions.

## 2017-01-16 ENCOUNTER — Ambulatory Visit (INDEPENDENT_AMBULATORY_CARE_PROVIDER_SITE_OTHER): Payer: PRIVATE HEALTH INSURANCE | Admitting: Family Medicine

## 2017-01-16 ENCOUNTER — Encounter: Payer: Self-pay | Admitting: Family Medicine

## 2017-01-16 VITALS — BP 109/71 | HR 82 | Temp 98.8°F | Ht 64.0 in | Wt 183.4 lb

## 2017-01-16 DIAGNOSIS — R590 Localized enlarged lymph nodes: Secondary | ICD-10-CM

## 2017-01-16 NOTE — Progress Notes (Signed)
BP 109/71   Pulse 82   Temp 98.8 F (37.1 C) (Oral)   Ht 5\' 4"  (1.626 m)   Wt 183 lb 6 oz (83.2 kg)   BMI 31.48 kg/m    Subjective:    Patient ID: Mackenzie Meyers, female    DOB: 1970-12-31, 46 y.o.   MRN: [id number]  HPI: Mackenzie Meyers is a 46 y.o. female presenting on 01/16/2017 for Lump underneath left arm (tender and swollen x 2 days; started Doxycyline for an infected ingrown nail and it appears to be better, wanted someone to look at it)   HPI Left axillary lymph node Patient is coming in today with complaints of pain and swelling under her left axilla. She has been having this for the past couple days. She also was diagnosed with an infection on an ingrown toenail 2 days ago and given doxycycline. She says since she's been on the doxycycline and the lymph node under her left axilla has reduced in size but is still little bit tender but is improving. She denies any fevers or chills or redness or warmth. She says she has had this once previously many years ago and it went away with an antibiotic as well. She denies any pain or swelling or redness or warmth anywhere else. She denies any breast lumps or discharge.  Relevant past medical, surgical, family and social history reviewed and updated as indicated. Interim medical history since our last visit reviewed. Allergies and medications reviewed and updated.  Review of Systems  Constitutional: Negative for chills and fever.  Respiratory: Negative for chest tightness and shortness of breath.   Cardiovascular: Negative for chest pain and leg swelling.  Musculoskeletal: Negative for back pain and gait problem.  Skin: Positive for color change (With toe infection). Negative for rash.  Hematological: Positive for adenopathy.  Psychiatric/Behavioral: Negative for agitation and behavioral problems.  All other systems reviewed and are negative.   Per HPI unless specifically indicated above        Objective:    BP 109/71   Pulse 82    Temp 98.8 F (37.1 C) (Oral)   Ht 5\' 4"  (1.626 m)   Wt 183 lb 6 oz (83.2 kg)   BMI 31.48 kg/m   Wt Readings from Last 3 Encounters:  01/16/17 183 lb 6 oz (83.2 kg)  10/31/16 187 lb (84.8 kg)  10/24/16 192 lb (87.1 kg)    Physical Exam  Constitutional: She is oriented to person, place, and time. She appears well-developed and well-nourished. No distress.  Eyes: Conjunctivae are normal.  Musculoskeletal: Normal range of motion.  Lymphadenopathy:       Left axillary: No lateral (Tender over where the lymph node could be in the left axilla but no lymph node palpable at this point. No overlying skin changes) adenopathy present. Neurological: She is alert and oriented to person, place, and time. Coordination normal.  Skin: Skin is warm and dry. No rash noted. She is not diaphoretic.  Psychiatric: She has a normal mood and affect. Her behavior is normal.  Nursing note and vitals reviewed.       Assessment & Plan:   Problem List Items Addressed This Visit    None    Visit Diagnoses    Axillary lymphadenopathy    -  Primary   Left-sided, has artery gone down on the 2 days of Doxy that she has been on, reassurance, continue Doxy       Follow up plan: Return if  symptoms worsen or fail to improve.  Counseling provided for all of the vaccine components No orders of the defined types were placed in this encounter.   Caryl Pina, MD [facility] 01/16/2017, 9:17 AM

## 2017-01-31 ENCOUNTER — Encounter: Payer: Self-pay | Admitting: Family Medicine

## 2017-01-31 ENCOUNTER — Ambulatory Visit (INDEPENDENT_AMBULATORY_CARE_PROVIDER_SITE_OTHER): Payer: PRIVATE HEALTH INSURANCE | Admitting: Family Medicine

## 2017-01-31 VITALS — BP 106/64 | HR 75 | Temp 99.1°F | Ht 64.0 in | Wt 183.4 lb

## 2017-01-31 DIAGNOSIS — R590 Localized enlarged lymph nodes: Secondary | ICD-10-CM

## 2017-01-31 NOTE — Progress Notes (Signed)
BP 106/64   Pulse 75   Temp 99.1 F (37.3 C) (Oral)   Ht 5\' 4"  (1.626 m)   Wt 183 lb 6 oz (83.2 kg)   BMI 31.48 kg/m    Subjective:    Patient ID: Mackenzie Meyers, female    DOB: 1970/12/15, 46 y.o.   MRN: [id number]  HPI: Mackenzie Meyers is a 46 y.o. female presenting on 01/31/2017 for Lump under left arm (was painful yesterday, has not hurt at all today)   HPI Lump on her left arm yesterday Patient developed a lump under her left arm yesterday that was painful and swollen but has resolved today. She also has a low-grade temperature of 99.1 today. She denies any cough or runny nose or congestion. She denies any new breast lumps or rashes anywhere. She denies any body aches but does have a little bit of fatigue.  Relevant past medical, surgical, family and social history reviewed and updated as indicated. Interim medical history since our last visit reviewed. Allergies and medications reviewed and updated.  Review of Systems  Constitutional: Positive for fever. Negative for chills.  HENT: Negative for congestion, ear discharge, ear pain, rhinorrhea, sinus pain, sinus pressure, sneezing and sore throat.   Respiratory: Negative for cough, chest tightness and shortness of breath.   Cardiovascular: Negative for chest pain and leg swelling.  Gastrointestinal: Negative for abdominal pain.  Genitourinary: Negative for difficulty urinating and dysuria.  Musculoskeletal: Negative for back pain and gait problem.  Skin: Negative for color change and rash.  Neurological: Negative for light-headedness and headaches.  Psychiatric/Behavioral: Negative for agitation and behavioral problems.  All other systems reviewed and are negative.   Per HPI unless specifically indicated above        Objective:    BP 106/64   Pulse 75   Temp 99.1 F (37.3 C) (Oral)   Ht 5\' 4"  (1.626 m)   Wt 183 lb 6 oz (83.2 kg)   BMI 31.48 kg/m   Wt Readings from Last 3 Encounters:  01/31/17 183 lb 6 oz (83.2 kg)   01/16/17 183 lb 6 oz (83.2 kg)  10/31/16 187 lb (84.8 kg)    Physical Exam  Constitutional: She is oriented to person, place, and time. She appears well-developed and well-nourished. No distress.  Eyes: Conjunctivae are normal.  Neck: Neck supple. No thyromegaly present.  Cardiovascular: Normal rate, regular rhythm, normal heart sounds and intact distal pulses.   No murmur heard. Pulmonary/Chest: Effort normal and breath sounds normal. No respiratory distress. She has no wheezes. She has no rales. She exhibits no mass, no edema and no retraction. Right breast exhibits no inverted nipple, no mass, no nipple discharge, no skin change and no tenderness. Left breast exhibits no inverted nipple, no mass, no nipple discharge, no skin change and no tenderness. Breasts are symmetrical.  Musculoskeletal: Normal range of motion.  Lymphadenopathy:    She has no cervical adenopathy.    She has no axillary adenopathy (No axillary lymphadenopathy or tenderness today, no erythema or warmth).       Right: No supraclavicular adenopathy present.       Left: No supraclavicular adenopathy present.  Neurological: She is alert and oriented to person, place, and time. Coordination normal.  Skin: Skin is warm and dry. No rash noted. She is not diaphoretic.  Psychiatric: She has a normal mood and affect. Her behavior is normal.  Nursing note and vitals reviewed.       Assessment &  Plan:   Problem List Items Addressed This Visit    None    Visit Diagnoses    Axillary lymphadenopathy    -  Primary   Left side, resolved since yesterday, likely viral in nature        Follow up plan: Return if symptoms worsen or fail to improve.  Counseling provided for all of the vaccine components No orders of the defined types were placed in this encounter.   Caryl Pina, MD [facility] 01/31/2017, 2:15 PM

## 2017-03-03 ENCOUNTER — Encounter: Payer: Self-pay | Admitting: Pediatrics

## 2017-03-03 ENCOUNTER — Ambulatory Visit (INDEPENDENT_AMBULATORY_CARE_PROVIDER_SITE_OTHER): Payer: PRIVATE HEALTH INSURANCE | Admitting: Pediatrics

## 2017-03-03 VITALS — BP 125/76 | HR 81 | Temp 99.3°F | Ht 64.0 in | Wt 188.2 lb

## 2017-03-03 DIAGNOSIS — R140 Abdominal distension (gaseous): Secondary | ICD-10-CM

## 2017-03-03 DIAGNOSIS — M25562 Pain in left knee: Secondary | ICD-10-CM | POA: Diagnosis not present

## 2017-03-03 DIAGNOSIS — W57XXXA Bitten or stung by nonvenomous insect and other nonvenomous arthropods, initial encounter: Secondary | ICD-10-CM

## 2017-03-03 DIAGNOSIS — M25561 Pain in right knee: Secondary | ICD-10-CM | POA: Diagnosis not present

## 2017-03-03 MED ORDER — DOXYCYCLINE HYCLATE 100 MG PO TABS: 100.0000 mg | ORAL_TABLET | Freq: Two times a day (BID) | ORAL | 0 refills | Status: DC

## 2017-03-03 NOTE — Progress Notes (Signed)
  Subjective:   Patient ID: Mackenzie Meyers, female    DOB: 1971-01-24, 46 y.o.   MRN: [id number] CC: GI Problem (for last week, wants to be tested for Lyme's disease); Fever (low grade fever); and Knee Pain (sees Dr. Andres Labrum. has had 3 ticks this year)  HPI: Mackenzie Meyers is a 46 y.o. female presenting for GI Problem (for last week, wants to be tested for Lyme's disease); Fever (low grade fever); and Knee Pain (sees Dr. Andres Labrum. has had 3 ticks this year)  Has had multiple tick bites most recent about 4 weeks ago Doesn't think attached for longer than 24h Has had very achy knees, gets worse throughout the [place] Is very worried about tick exposure being cause Took ibuprofen 800mg  last night, was able to sleep Otherwise hasnt taken anything for the knee pain No swelling or redness in knees Both knees hurt equally  Sees chiropractor for back pain  Has had abd discomfort starting about 10 days ago, some bloating Felt like needed to pass stool, would be [place], soft stools One Zimmers last week had 11 episodes of diarrhea No change in diet Stay at home No other people at home have been sick   Relevant past medical, surgical, family and social history reviewed. Allergies and medications reviewed and updated. History  Smoking Status  . Current Every Busser Smoker  . Packs/[place]: 1.00  . Last attempt to quit: 08/17/2012  Smokeless Tobacco  . Never Used    Comment: restarted 10/17 d/t husband losing job   ROS: Per HPI   Objective:    BP 125/76   Pulse 81   Temp 99.3 F (37.4 C) (Oral)   Ht 5\' 4"  (1.626 m)   Wt 188 lb 3.2 oz (85.4 kg)   BMI 32.30 kg/m   Wt Readings from Last 3 Encounters:  03/03/17 188 lb 3.2 oz (85.4 kg)  01/31/17 183 lb 6 oz (83.2 kg)  01/16/17 183 lb 6 oz (83.2 kg)    Gen: NAD, alert, cooperative with exam, NCAT EYES: EOMI, no conjunctival injection, or no icterus ENT:  TMs pearly gray b/l, OP without erythema LYMPH: no cervical LAD CV: NRRR, normal S1/S2,  no murmur, distal pulses 2+ b/l Resp: CTABL, no wheezes, normal WOB Abd: +BS, soft, mildly tender throughout with papation, ND. no guarding or organomegaly Ext: No edema, warm Neuro: Alert and oriented, strength equal b/l UE and LE, coordination grossly normal MSK: no redness, swelling in b/l knees, nl ROM b/l, slight crepitus b/l with ROM, no pain with patella movement or rocking, no joint line tenderness b/l  Assessment & Plan:  Mackenzie Meyers was seen today for gi problem, fever and knee pain.  Diagnoses and all orders for this visit:  Tick bite, initial encounter Pt with new joint pain, very concerned about tick exposure, low grade temps at home, will treat with below -     doxycycline (VIBRA-TABS) 100 MG tablet; Take 1 tablet (100 mg total) by mouth 2 (two) times daily.  Bloating Increase fiber in diet, trial low FODMAPs diet Let me know if not improving  Acute pain of both knees May be reactive from recent GI illness as above, cont to follow  Follow up plan: 4 weeks, sooner if needed Assunta Found, MD [place]

## 2017-03-03 NOTE — Patient Instructions (Addendum)
Low Fod Maps diet  Fiber every morning

## 2017-03-07 ENCOUNTER — Ambulatory Visit: Payer: PRIVATE HEALTH INSURANCE | Admitting: Pediatrics

## 2017-03-18 ENCOUNTER — Ambulatory Visit (INDEPENDENT_AMBULATORY_CARE_PROVIDER_SITE_OTHER): Payer: PRIVATE HEALTH INSURANCE | Admitting: Family Medicine

## 2017-03-18 ENCOUNTER — Encounter: Payer: Self-pay | Admitting: Family Medicine

## 2017-03-18 VITALS — BP 113/72 | HR 89 | Temp 97.2°F | Ht 64.0 in | Wt 184.0 lb

## 2017-03-18 DIAGNOSIS — S30866A Insect bite (nonvenomous) of unspecified external genital organs, female, initial encounter: Secondary | ICD-10-CM

## 2017-03-18 DIAGNOSIS — W57XXXD Bitten or stung by nonvenomous insect and other nonvenomous arthropods, subsequent encounter: Secondary | ICD-10-CM | POA: Diagnosis not present

## 2017-03-18 NOTE — Progress Notes (Signed)
BP 113/72   Pulse 89   Temp (!) 97.2 F (36.2 C) (Oral)   Ht 5\' 4"  (1.626 m)   Wt 184 lb (83.5 kg)   BMI 31.58 kg/m    Subjective:    Patient ID: Starla D Freshour, female    DOB: 10/09/1970, 46 y.o.   MRN: [id number]  HPI: Tieisha Darden Smyser is a 46 y.o. female presenting on 03/18/2017 for Tick Removal (2 week followup)   HPI Tick bite follow-up Patient comes in today for follow-up on tick bite. Patient had pain in both of her knees and ankles and was concerned about possible Lyme disease. She took the treatment of doxycycline and feels much better and has only a very small amount of residual pain in each joint. She denies any fevers or chills or rashes. She says the takes that she found most recently were 1 to months ago and her left groin/pubic region and one last year near the end of the year in the same area. Both were engorged and had been on her for some time.  Relevant past medical, surgical, family and social history reviewed and updated as indicated. Interim medical history since our last visit reviewed. Allergies and medications reviewed and updated.  Review of Systems  Constitutional: Negative for chills and fever.  Eyes: Negative for redness and visual disturbance.  Respiratory: Negative for chest tightness and shortness of breath.   Cardiovascular: Negative for chest pain and leg swelling.  Genitourinary: Negative for difficulty urinating and dysuria.  Musculoskeletal: Negative for arthralgias, back pain, gait problem and myalgias.  Skin: Negative for rash.  Neurological: Negative for dizziness, light-headedness and headaches.  Psychiatric/Behavioral: Negative for agitation and behavioral problems.  All other systems reviewed and are negative.   Per HPI unless specifically indicated above        Objective:    BP 113/72   Pulse 89   Temp (!) 97.2 F (36.2 C) (Oral)   Ht 5\' 4"  (1.626 m)   Wt 184 lb (83.5 kg)   BMI 31.58 kg/m   Wt Readings from Last 3 Encounters:    03/18/17 184 lb (83.5 kg)  03/03/17 188 lb 3.2 oz (85.4 kg)  01/31/17 183 lb 6 oz (83.2 kg)    Physical Exam  Constitutional: She is oriented to person, place, and time. She appears well-developed and well-nourished. No distress.  Eyes: Conjunctivae are normal.  Cardiovascular: Normal rate, regular rhythm, normal heart sounds and intact distal pulses.   No murmur heard. Pulmonary/Chest: Effort normal and breath sounds normal. No respiratory distress. She has no wheezes.  Musculoskeletal: Normal range of motion. She exhibits no edema or tenderness.  Neurological: She is alert and oriented to person, place, and time. Coordination normal.  Skin: Skin is warm and dry. No rash noted. She is not diaphoretic.  Psychiatric: She has a normal mood and affect. Her behavior is normal.  Nursing note and vitals reviewed.     Assessment & Plan:   Problem List Items Addressed This Visit    None    Visit Diagnoses    Tick bite, subsequent encounter    -  Primary   Relevant Orders   Rocky mtn spotted fvr abs pnl(IgG+IgM)   Alpha-Gal Panel   Lyme Ab/Western Blot Reflex       Follow up plan: Return if symptoms worsen or fail to improve.  Counseling provided for all of the vaccine components Orders Placed This Encounter  Procedures  . Rocky mtn spotted fvr  abs pnl(IgG+IgM)  . Alpha-Gal Panel  . Lyme Ab/Western Blot Reflex    Caryl Pina, MD [facility] 03/18/2017, 1:29 PM

## 2017-03-21 LAB — ALPHA-GAL PANEL
Alpha Gal IgE*: <0.1 kU/L (ref <0.35)
Beef (Bos spp) IgE: <0.1 kU/L (ref <0.35)
Class Interpretation: 0
Class Interpretation: 0
Class Interpretation: 0
Lamb/Mutton (Ovis spp) IgE: <0.1 kU/L (ref <0.35)
Pork (Sus spp) IgE: <0.1 kU/L (ref <0.35)

## 2017-03-21 LAB — ROCKY MTN SPOTTED FVR ABS PNL(IGG+IGM)
RMSF IgG: NEGATIVE
RMSF IgM: 0.58 index (ref 0.00–0.89)

## 2017-03-21 LAB — LYME AB/WESTERN BLOT REFLEX
LYME DISEASE AB, QUANT, IGM: <0.8 index (ref 0.00–0.79)
Lyme IgG/IgM Ab: <0.91 {ISR} (ref 0.00–0.90)

## 2017-04-02 ENCOUNTER — Encounter: Payer: Self-pay | Admitting: Neurology

## 2017-04-07 ENCOUNTER — Encounter: Payer: Self-pay | Admitting: Neurology

## 2017-04-07 ENCOUNTER — Ambulatory Visit (INDEPENDENT_AMBULATORY_CARE_PROVIDER_SITE_OTHER): Payer: PRIVATE HEALTH INSURANCE | Admitting: Neurology

## 2017-04-07 VITALS — BP 113/77 | HR 79 | Ht 64.0 in | Wt 187.0 lb

## 2017-04-07 DIAGNOSIS — G4719 Other hypersomnia: Secondary | ICD-10-CM | POA: Diagnosis not present

## 2017-04-07 DIAGNOSIS — E669 Obesity, unspecified: Secondary | ICD-10-CM

## 2017-04-07 NOTE — Progress Notes (Signed)
SLEEP MEDICINE CLINIC   Provider:  Larey Seat, M D  Referring Provider: Dettinger, Mackenzie Kaufmann, MD Primary Care Physician:  Meyers, Mackenzie Kaufmann, MD  Chief Complaint  Patient presents with  . Follow-up    intial CPAP follow up, pt doesnt feel like she is sleeping any better, the mask she is currently using is her 2nd try and its not working well. the patient is meaning to get in touch with Mackenzie Meyers and see what she can do differently.     HPI:  Mackenzie Meyers is a 46 y.o. female , was seen here as a referral from Dr. Warrick Meyers for a sleep evaluation, today is follow up on her sleep study.  With CPAP area in a study from 11/15/2016 with very mild apnea and may say at an AHI of 6.4 per hour but during REM sleep accentuated to 33.3 per hour. Her Epworth sleepiness score was endorsed at 15 points and for this reason he tried CPAP. She came back for a CPAP titration on 12/06/2016, tolerated CPAP very well and needed only 6 cm water. She was initially fitted with an air fit P 10 and small size, she states that she saw developed irritation of the nostrils and was changed to a nasal mask. Nasal mask no pushes on the bridge of her nose seems to aggravate her sinuses restrict her breathing. She will get in touch with her DM E, Mackenzie Meyers, and see if she can switch to a wisp or to a dream wear mask.  Mackenzie Meyers also did a very good effort at compliance, over the last 30 days she used the device every Mackenzie Meyers, 29 days over 4 hours, which a compliance of 97%, average of 7 hours and 32 minutes of use, CPAP is set at 6 cm water pressure with 3 cm EPR and her residual AHI is only 0.5 apneas per hour. There are no desaturations in o2 ,according to her CPAP titration study ,while  the patient uses CPAP.      3/ 2018 According to the referral notes the patient is concerned about fatigue and her feeling of lacking energy. She feels exhausted many days she has diffuse joint aches and pains and has been seen by primary  care and rheumatology, has had lupus testing and thyroid checks. Mackenzie Meyers has been acutely developing but rather gradually over time. For many years she lacked insurance so she couldn't have it checked out. She will sleep at night but still will have the need to take naps in the daytime on those days where she is not working. He was taking a nap she doesn't have issues to fall asleep at night. Her husband has told her that she is snores loudly, she wakes up multiple times at night it does not feel like she gets restorative sleep. She has a history of some anxiety and OCD but feels it is somewhat under control and she denies chest pain, palpitations, diaphoresis or dizziness. Once a week she may wake up with headaches but she has many other days with headaches. The headaches do not wake her up. Headaches are usually located above the right eye and temple.  She had given birth to her third child 32 month ago, at age 46 , but fatigue proceeded the pregnancy.   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 Her  youngest daughter naps at 48 AM and she takes a nap alongside for 1-1.5 hours .  She may sleep up to 3 hours while the youngest, Mackenzie Meyers,  is in daycare. She has no trouble sleeping again at her  nocturnal Bedtime, which  is between 9 and 9:30 PM. Her marital bedroom is cool , quiet and dark. Dog " Mackenzie Meyers"  sleeps in the bedroom, but not in the bed.  She prefers to sleep on her side, on one pillow.  Sleep medical history and family sleep history: brother and father are using CPAP for OSA.  No enuresis, sleep walking, no TBI  . Oldest son had enuresis and night terrors.   Social history: She is a 25 year history of smoking and recently relapsed. She is determined to quit this month. Very seldomly drinks alcohol, caffeine use: One caffeinated soda a Rezek, only decaffeinated  ice tea and  coffee, 1-2 a Butson. 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    Review of Systems: Out of a complete 14 system review, the patient complains of only the following symptoms, and all other reviewed systems are negative.  The patient reports fatigue, snoring, joint pain, anxiety, decreased level of energy possible apnea always feeling tired napping daily, she also has vitiligo, history of OCD, migraine, IIIc injections, tonsillectomy in the year 2000 as an adult, abdominoplasty 2008 surgery of the left breast 2008  Epworth score 13/15 , Fatigue severity score  38 from 48  , depression score 2 out 15. All numbers improved under CPAP.   Social History   Social History  . Marital status: Married    Spouse name: N/A  . Number of children: 3  . Years of education: N/A   Occupational History  . Not on file.   Social History Main Topics  . Smoking status: Current Every Sarti Smoker    Packs/Berrian: 1.00    Last attempt to quit: 08/17/2012  . Smokeless tobacco: Never Used     Comment: restarted 10/17 d/t husband losing job  . Alcohol use Yes     Comment: rare, twice a year  . Drug use: No  . Sexual activity: Yes    Partners: Male    Birth control/ protection: IUD     Comment: Mirena inserted 03-2015-1st intercourse 46 yo-More than 5 partners   Other Topics Concern  . Not on file   Social History Narrative   Dinks 1 soda a Shingledecker     Family History  Problem Relation Age of Onset  . Cancer Maternal Grandfather        lymphoma  . Cancer Paternal Grandfather        liver  . Lung cancer Maternal Grandmother        and maternal uncle  . Cancer Maternal Uncle        Lung cancer  . Hyperlipidemia Other     Past Medical History:  Diagnosis Date  . Anxiety   . Arthritis    hands  . Breast mass, right 09/2012  . Cervical dysplasia age 46  . GDM (gestational diabetes mellitus)   . Geographical tongue   . 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  History of kidney stones 10/2011  . 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 OCD (obsessive compulsive disorder)   . Rosacea   . Sinus congestion 10/12/2012  . Sleep apnea    patient  is wearing C-pap  . Sleep apnea    wearing a C-pap  . Vitamin D deficiency 10/2013   Value 29  . Vitiligo     Past Surgical History:  Procedure Laterality Date  . ABDOMINOPLASTY  2008  . BREAST BIOPSY Right 10/15/2012   Procedure: BREAST BIOPSY;  Surgeon: Haywood Lasso, MD;  Location: [facility];  Service: General;  Laterality: Right;  removal right breast mass  . BREAST SURGERY     Lift  . CESAREAN SECTION  2004, 2007  . CESAREAN SECTION N/A 02/07/2015   Procedure: CESAREAN SECTION;  Surgeon: Jerelyn Charles, MD;  Location: [facility];  Service: Obstetrics;  Laterality: N/A;  EDD: 02/13/15   . dialation and cutterage    . GYNECOLOGIC CRYOSURGERY  age 80  . INTRAUTERINE DEVICE INSERTION  03/2015   Mirena at [place]  . TONSILLECTOMY  2000    Current Outpatient Prescriptions  Medication Sig Dispense Refill  . acetaminophen (TYLENOL) 500 MG tablet Take 1,000 mg by mouth every 6 (six) hours as needed for mild pain, moderate pain or headache. Reported on 02/20/2016    . ibuprofen (ADVIL,MOTRIN) 200 MG tablet Take 200 mg by mouth every 6 (six) hours as needed. Patient takes 4 at a time    . levonorgestrel (MIRENA) 20 MCG/24HR IUD 1 each by Intrauterine route once.    . valACYclovir (VALTREX) 500 MG tablet Take 1 tablet (500 mg total) by mouth daily. 90 tablet 3   No current facility-administered medications for this visit.     Allergies as of 04/07/2017 - Review Complete 03/18/2017  Allergen Reaction Noted  . Ampicillin Hives 03/25/2011  . Erythromycin Nausea And Vomiting 11/04/2011  . Other Hives 10/12/2012  . Ciprofloxacin Hives and Rash 10/27/2012    Vitals: BP 113/77   Pulse 79   Ht 5' 4"  (1.626 m)   Wt 187 lb (84.8 kg)   BMI  32.10 kg/m  Last Weight:  Wt Readings from Last 1 Encounters:  04/07/17 187 lb (84.8 kg)   GMW:NUUV mass index is 32.1 kg/m.     Last Height:   Ht Readings from Last 1 Encounters:  04/07/17 5' 4"  (1.626 m)    Physical exam:  General: The patient is awake, alert and appears not in acute distress. The patient is well groomed. Head: Normocephalic, atraumatic. Neck is supple. Mallampati 3,  neck circumference 2. Nasal airflow patent,  Cardiovascular:  Regular rate and rhythm, without  murmurs or carotid bruit, and without distended neck veins. Respiratory: Lungs are not clear to auscultation- mild wheezing, rhonci . Skin:  Facial rash at the bridge of the nose.  Trunk: BMI is 33. The patient's posture is erect.  Neurologic exam :The patient is awake and alert, oriented to place and time. Mood and affect are appropriate. Cranial nerves: Pupils are equal and briskly reactive to light. Hearing to finger rub intact. Facial sensation intact to fine touch. Facial motor strength is symmetric , her tongue and uvula move midline. Shoulder shrug was symmetrical. Tongue protrusion is strong.   Motor exam: Normal tone, muscle bulk and symmetric strength in all extremities. Strength within normal limits.Stance is stable and normal.   Deep tendon reflexes: in the  upper and lower extremities are symmetric, rather brisk-  Babinski maneuver deferred.  The patient was advised of the nature of the diagnosed sleep disorder , the treatment options and risks for general a health and wellness arising from not treating the condition.  I  spent more than 25  minutes of face to face time with the patient. Greater than 50% of time was spent in counseling and coordination of care. We have discussed the diagnosis and differential and I answered the patient's questions.     Assessment:  After physical and neurologic examination, review of laboratory studies,  Personal review of imaging studies, reports of other /same   Imaging studies ,  Results of polysomnography/ neurophysiology testing and pre-existing records as far as provided in visit., my assessment is   1)  Mrs. Flener was diagnosed with a very mild form of sleep apnea but nonetheless accentuated in REM sleep. There was no hypoxemia. It was due to the high degree of daytime sleepiness but I suggested she should try CPAP. CPAP has reduced her mild apnea completely at his AHI of 0.5, it will have taken care of her snoring but the interface was not comfortable. We could offer a dental device would there be the REM accentuation. Also if Mrs. weight today is successful in reducing her body mass index she probably will not need CPAP much longer. I will send her to her DME was a question of giving her an alternative interface, I suggested a dream wear or a wisp.   Plan:  Treatment plan and additional workup : [facility] to refit. Encouraged weight loss.  Encouraged further CPAP use.    Asencion Partridge Manroop Jakubowicz MD  04/07/2017   CC: Meyers, Mackenzie Meyers, [address], [place] [address]

## 2017-04-07 NOTE — Patient Instructions (Signed)
Modafinil tablets What is this medicine? MODAFINIL (moe DAF i nil) is used to treat excessive sleepiness caused by certain sleep disorders. This includes narcolepsy, sleep apnea, and shift work sleep disorder. This medicine may be used for other purposes; ask your health care provider or pharmacist if you have questions. COMMON BRAND NAME(S): Provigil What should I tell my health care provider before I take this medicine? They need to know if you have any of these conditions: -history of depression, mania, or other mental disorder -kidney disease -liver disease -an unusual or allergic reaction to modafinil, other medicines, foods, dyes, or preservatives -pregnant or trying to get pregnant -breast-feeding How should I use this medicine? Take this medicine by mouth with a glass of water. Follow the directions on the prescription label. Take your doses at regular intervals. Do not take your medicine more often than directed. Do not stop taking except on your doctor's advice. A special MedGuide will be given to you by the pharmacist with each prescription and refill. Be sure to read this information carefully each time. Talk to your pediatrician regarding the use of this medicine in children. This medicine is not approved for use in children. Overdosage: If you think you have taken too much of this medicine contact a poison control center or emergency room at once. NOTE: This medicine is only for you. Do not share this medicine with others. What if I miss a dose? If you miss a dose, take it as soon as you can. If it is almost time for your next dose, take only that dose. Do not take double or extra doses. What may interact with this medicine? Do not take this medicine with any of the following medications: -amphetamine or dextroamphetamine -dexmethylphenidate or methylphenidate -medicines called MAO Inhibitors like Nardil, Parnate, Marplan, Eldepryl -pemoline -procarbazine This medicine  may also interact with the following medications: -antifungal medicines like itraconazole or ketoconazole -barbiturates like phenobarbital -birth control pills or other hormone-containing birth control devices or implants -carbamazepine -cyclosporine -diazepam -medicines for depression, anxiety, or psychotic disturbances -phenytoin -propranolol -triazolam -warfarin This list may not describe all possible interactions. Give your health care provider a list of all the medicines, herbs, non-prescription drugs, or dietary supplements you use. Also tell them if you smoke, drink alcohol, or use illegal drugs. Some items may interact with your medicine. What should I watch for while using this medicine? Visit your doctor or health care professional for regular checks on your progress. The full effects of this medicine may not be seen right away. This medicine may affect your concentration, function, or may hide signs that you are tired. You may get dizzy. Do not drive, use machinery, or do anything that needs mental alertness until you know how this drug affects you. Alcohol can make you more dizzy and may interfere with your response to this medicine or your alertness. Avoid alcoholic drinks. Birth control pills may not work properly while you are taking this medicine. Talk to your doctor about using an extra method of birth control. It is unknown if the effects of this medicine will be increased by the use of caffeine. Caffeine is available in many foods, beverages, and medications. Ask your doctor if you should limit or change your intake of caffeine-containing products while on this medicine. What side effects may I notice from receiving this medicine? Side effects that you should report to your doctor or health care professional as soon as possible: -allergic reactions like skin rash, itching  or hives, swelling of the face, lips, or tongue -anxiety -breathing problems -chest pain -fast,  irregular heartbeat -hallucinations -increased blood pressure -redness, blistering, peeling or loosening of the skin, including inside the mouth -sore throat, fever, or chills -suicidal thoughts or other mood changes -tremors -vomiting Side effects that usually do not require medical attention (report to your doctor or health care professional if they continue or are bothersome): -headache -nausea, diarrhea, or stomach upset -nervousness -trouble sleeping This list may not describe all possible side effects. Call your doctor for medical advice about side effects. You may report side effects to FDA at 1-800-FDA-1088. Where should I keep my medicine? Keep out of the reach of children. This medicine can be abused. Keep your medicine in a safe place to protect it from theft. Do not share this medicine with anyone. Selling or giving away this medicine is dangerous and against the law. This medicine may cause accidental overdose and death if taken by other adults, children, or pets. Mix any unused medicine with a substance like cat litter or coffee grounds. Then throw the medicine away in a sealed container like a sealed bag or a coffee can with a lid. Do not use the medicine after the expiration date. Store at room temperature between 20 and 25 degrees C (68 and 77 degrees F). NOTE: This sheet is a summary. It may not cover all possible information. If you have questions about this medicine, talk to your doctor, pharmacist, or health care provider.  2018 Elsevier/Gold Standard (2014-05-03 15:34:55)   Hypersomnia Hypersomnia is when you feel extremely tired during the [place] even though you're getting plenty of sleep at night. You may need to take naps during the [place], and you may also be extremely difficult to wake up when you are sleeping. What are the causes? The cause of your hypersomnia may not be known. Hypersomnia may be caused by:  Medicines.  Sleep disorders, such as  narcolepsy.  Trauma or injury to your head or nervous system.  Using drugs or alcohol.  Tumors.  Medical conditions, such as depression or hypothyroidism.  Genetics.  What are the signs or symptoms? The main symptoms of hypersomnia include:  Feeling extremely tired throughout the [place].  Being very difficult to wake up.  Sleeping for longer and longer periods.  Taking naps throughout the [place].  Other symptoms may include:  Feeling: ? Restless. ? Annoyed. ? Anxious. ? Low energy.  Having difficulty: ? Remembering. ? Speaking. ? Thinking.  Losing your appetite.  Experiencing hallucinations.  How is this diagnosed? Hypersomnia may be diagnosed by:  Medical history and physical exam. This will include a sleep history.  Completing sleep logs.  Tests may also be done, such as: ? Polysomnography. ? Multiple sleep latency test (MSLT).  How is this treated? There is no cure for hypersomnia, but treatment can be very effective in helping manage the condition. Treatment may include:  Lifestyle and sleeping strategies to help cope with the condition.  Stimulant medicines.  Treating any underlying causes of hypersomnia.  Follow these instructions at home:  Take medicines only as directed by your health care provider.  Schedule short naps for when you feel sleepiest during the [place]. Tell your employer or teachers that you have hypersomnia. You may be able to adjust your schedule to include time for naps.  Avoid drinking alcohol or caffeinated beverages.  Do not eat a heavy meal before bedtime. Eat at about the same times every [place].  Do not  drive or operate heavy machinery if you are sleepy.  Do not swim or go out on the water without a life jacket.  If possible, adjust your schedule so that you do not have to work or be active at night.  Keep all follow-up visits as directed by your health care provider. This is important. Contact a health care provider  if:  You have new symptoms.  Your symptoms get worse. Get help right away if: You have serious thoughts of hurting yourself or someone else. This information is not intended to replace advice given to you by your health care provider. Make sure you discuss any questions you have with your health care provider. Document Released: 08/02/2002 Document Revised: 01/18/2016 Document Reviewed: 03/17/2014 Elsevier Interactive Patient Education  [employer].

## 2017-04-26 DIAGNOSIS — G4730 Sleep apnea, unspecified: Secondary | ICD-10-CM

## 2017-04-26 HISTORY — DX: Sleep apnea, unspecified: G47.30

## 2017-08-15 ENCOUNTER — Ambulatory Visit (INDEPENDENT_AMBULATORY_CARE_PROVIDER_SITE_OTHER): Payer: PRIVATE HEALTH INSURANCE | Admitting: Gynecology

## 2017-08-15 ENCOUNTER — Encounter: Payer: Self-pay | Admitting: Gynecology

## 2017-08-15 VITALS — BP 120/76

## 2017-08-15 DIAGNOSIS — N760 Acute vaginitis: Secondary | ICD-10-CM

## 2017-08-15 DIAGNOSIS — B9689 Other specified bacterial agents as the cause of diseases classified elsewhere: Secondary | ICD-10-CM

## 2017-08-15 LAB — WET PREP FOR TRICH, YEAST, CLUE

## 2017-08-15 MED ORDER — METRONIDAZOLE 500 MG PO TABS: 500.0000 mg | ORAL_TABLET | Freq: Two times a day (BID) | ORAL | 0 refills | Status: DC

## 2017-08-15 MED ORDER — FLUCONAZOLE 150 MG PO TABS: 150.0000 mg | ORAL_TABLET | Freq: Once | ORAL | 0 refills | Status: AC

## 2017-08-15 NOTE — Patient Instructions (Signed)
Take the Flagyl medication twice daily for 7 days.  Avoid alcohol while taking. Take the Diflucan pill once.  Follow-up if your symptoms persist, worsen or recur

## 2017-08-15 NOTE — Progress Notes (Signed)
    Mackenzie Meyers [birth year] [id number]        46 y.o.  [name] presents with several days of vaginal odor and irritation.  No significant discharge.  No urinary symptoms such as frequency dysuria urgency low back pain fever or chills.  Past medical history,surgical history, problem list, medications, allergies, family history and social history were all reviewed and documented in the EPIC chart.  Directed ROS with pertinent positives and negatives documented in the history of present illness/assessment and plan.  Exam: Mackenzie Meyers assistant Vitals:   08/15/17 1007  BP: 120/76   General appearance:  Normal Abdomen soft nontender without masses guarding rebound Pelvic external BUS vagina with thick white discharge.  Cervix normal with IUD string visualized.  Uterus normal size midline mobile nontender.  Adnexa without masses or tenderness  Assessment/Plan:  46 y.o. [id number] prep is unremarkable.  Exam is consistent with bacterial vaginosis with a heavier discharge.  Has used Flagyl in the past with good results.  Flagyl 500 mg twice daily times 7 days.  Alcohol avoidance reviewed.  I am going to cover her in the event she has a low-grade yeast also given it is the beginning of the Christmas and long weekend and I do not want her to be stuck over the weekend if she continues to have some symptoms despite the Flagyl medication.  We will follow-up if her symptoms persist, worsen or recur.    [name] MD, 10:28 AM 08/15/2017

## 2017-08-15 NOTE — Addendum Note (Signed)
Addended by: Nelva Nay on: 08/15/2017 12:08 PM   Modules accepted: Orders

## 2017-09-25 ENCOUNTER — Telehealth: Payer: Self-pay | Admitting: Family Medicine

## 2017-09-25 MED ORDER — OSELTAMIVIR PHOSPHATE 75 MG PO CAPS: 75.0000 mg | ORAL_CAPSULE | Freq: Two times a day (BID) | ORAL | 0 refills | Status: DC

## 2017-09-25 NOTE — Telephone Encounter (Signed)
Please let the patient know I sent in Tamiflu, if she has not had any symptoms that she can take it preventatively as 75 mg once daily for 10 days but if she develops any symptoms then have her take it twice a [place] for 5 days. Caryl Pina, MD [facility] 09/25/2017, 9:11 AM

## 2017-09-25 NOTE — Telephone Encounter (Signed)
Pt aware, she does have symptoms so will take bid x 5 days

## 2017-10-02 ENCOUNTER — Encounter: Payer: Self-pay | Admitting: Gynecology

## 2017-10-02 ENCOUNTER — Ambulatory Visit (INDEPENDENT_AMBULATORY_CARE_PROVIDER_SITE_OTHER): Payer: PRIVATE HEALTH INSURANCE | Admitting: Gynecology

## 2017-10-02 VITALS — BP 118/76 | Ht 65.0 in | Wt 191.0 lb

## 2017-10-02 DIAGNOSIS — N898 Other specified noninflammatory disorders of vagina: Secondary | ICD-10-CM

## 2017-10-02 DIAGNOSIS — Z01411 Encounter for gynecological examination (general) (routine) with abnormal findings: Secondary | ICD-10-CM

## 2017-10-02 DIAGNOSIS — E559 Vitamin D deficiency, unspecified: Secondary | ICD-10-CM

## 2017-10-02 DIAGNOSIS — Z30431 Encounter for routine checking of intrauterine contraceptive device: Secondary | ICD-10-CM

## 2017-10-02 DIAGNOSIS — B001 Herpesviral vesicular dermatitis: Secondary | ICD-10-CM | POA: Diagnosis not present

## 2017-10-02 LAB — WET PREP FOR TRICH, YEAST, CLUE

## 2017-10-02 MED ORDER — VALACYCLOVIR HCL 500 MG PO TABS: 500.0000 mg | ORAL_TABLET | Freq: Every day | ORAL | 4 refills | Status: DC

## 2017-10-02 MED ORDER — FLUCONAZOLE 150 MG PO TABS: 150.0000 mg | ORAL_TABLET | Freq: Once | ORAL | 0 refills | Status: AC

## 2017-10-02 NOTE — Patient Instructions (Signed)
Schedule mammogram Follow up for annual exam in one year

## 2017-10-02 NOTE — Progress Notes (Signed)
    Mackenzie Meyers [birth year] [id number]        47 y.o.  [id number] for annual gynecologic exam.  Has a history of recurrent vaginitis in the past with yeast and bacterial vaginosis.  Noticed a slight odor yesterday.  No significant irritation or itching.  No urinary symptoms such as frequency dysuria urgency low back pain fever or chills.  Past medical history,surgical history, problem list, medications, allergies, family history and social history were all reviewed and documented as reviewed in the EPIC chart.  ROS:  Performed with pertinent positives and negatives included in the history, assessment and plan.   Additional significant findings : None   Exam: Mackenzie Meyers assistant Vitals:   10/02/17 0935  BP: 118/76  Weight: 191 lb (Mackenzie.6 kg)  Height: 5\' 5"  (1.651 m)   Body mass index is 31.78 kg/m.  General appearance:  Normal affect, orientation and appearance. Skin: Grossly normal HEENT: Without gross lesions.  No cervical or supraclavicular adenopathy. Thyroid normal.  Lungs:  Clear without wheezing, rales or rhonchi Cardiac: RR, without RMG Abdominal:  Soft, nontender, without masses, guarding, rebound, organomegaly or hernia Breasts:  Examined lying and sitting without masses, retractions, discharge or axillary adenopathy. Pelvic:  Ext, BUS, Vagina: With slight white discharge  Cervix: IUD string visualized  Uterus: Anteverted, normal size, shape and contour, midline and mobile nontender   Adnexa: Without masses or tenderness    Anus and perineum: Normal   Rectovaginal: Normal sphincter tone without palpated masses or tenderness.    Assessment/Plan:  Mackenzie Meyers female for annual gynecologic exam without menses, Mirena IUD.   1. Vaginal discharge.  Notes some odor but no significant irritation.  Does have a history of recurrent yeast vaginitis and bacterial vaginosis.  Wet prep is positive for yeast.  We will treat with Diflucan 150 mg x1 dose.  I gave her 2 additional  pills to have available in the event she redevelops early symptoms this coming year. 2. Mirena IUD 03/2015.  Doing well without menses.  IUD string visualized. 3. Pap smear 2016 with positive high risk HPV normal cytology.  No other history of abnormal Pap smears.  Pap smear/HPV 2018 negative.  No Pap smear done today.  We will continue to screen and less frequent interval per current screening guidelines. 4. Mammography 07/2015.  Recommended patient schedule screening mammogram and she agrees to do so.  Breast exam normal today. 5. History of recurrent herpes labialis.  Had been on Valtrex 500 mg daily for suppression through her primary physician.  Asked if I could refill this for her.  Valtrex 500 mg #90 with 4 refills. 6. History of vitamin D deficiency with vitamin D 20 last year at her primary 38 office.  She does take some extra OTC vitamin D.  Check vitamin D level today. 7. Health maintenance.  No other routine blood work done as she does this at her primary physician's office.  Follow-up in 1 year, sooner as needed.  Additional time in excess of her routine annual gynecologic exam was spent in direct face to face counseling and coordination of care in regards to her yeast vaginitis.    Mackenzie Auerbach MD, 9:57 AM 10/02/2017

## 2017-10-03 ENCOUNTER — Other Ambulatory Visit: Payer: Self-pay | Admitting: *Deleted

## 2017-10-03 DIAGNOSIS — E559 Vitamin D deficiency, unspecified: Secondary | ICD-10-CM

## 2017-10-03 LAB — VITAMIN D 25 HYDROXY (VIT D DEFICIENCY, FRACTURES): Vit D, 25-Hydroxy: 20 ng/mL — ABNORMAL LOW (ref 30–100)

## 2017-10-03 MED ORDER — VITAMIN D (ERGOCALCIFEROL) 1.25 MG (50000 UNIT) PO CAPS: 50000.0000 [IU] | ORAL_CAPSULE | ORAL | 0 refills | Status: DC

## 2017-10-08 ENCOUNTER — Ambulatory Visit: Payer: PRIVATE HEALTH INSURANCE | Admitting: Adult Health

## 2017-10-20 ENCOUNTER — Encounter: Payer: Self-pay | Admitting: Family Medicine

## 2017-10-20 ENCOUNTER — Ambulatory Visit (INDEPENDENT_AMBULATORY_CARE_PROVIDER_SITE_OTHER): Payer: PRIVATE HEALTH INSURANCE | Admitting: Family Medicine

## 2017-10-20 VITALS — BP 130/78 | HR 96 | Temp 99.3°F | Ht 65.0 in | Wt 194.0 lb

## 2017-10-20 DIAGNOSIS — J069 Acute upper respiratory infection, unspecified: Secondary | ICD-10-CM

## 2017-10-20 LAB — VERITOR FLU A/B WAIVED
Influenza A: NEGATIVE
Influenza B: NEGATIVE

## 2017-10-20 MED ORDER — FLUTICASONE PROPIONATE 50 MCG/ACT NA SUSP: 1.0000 | Freq: Two times a day (BID) | NASAL | 6 refills | Status: DC | PRN

## 2017-10-20 MED ORDER — BENZONATATE 200 MG PO CAPS: 200.0000 mg | ORAL_CAPSULE | Freq: Two times a day (BID) | ORAL | 0 refills | Status: DC | PRN

## 2017-10-20 NOTE — Progress Notes (Signed)
BP 130/78   Pulse 96   Temp 99.3 F (37.4 C) (Oral)   Ht 5\' 5"  (1.651 m)   Wt 194 lb (88 kg)   BMI 32.28 kg/m    Subjective:    Patient ID: Mackenzie Meyers, female    DOB: Jan 22, 1971, 47 y.o.   MRN: [id number]  HPI: Mackenzie Meyers is a 47 y.o. female presenting on 10/20/2017 for Sneezing, cough, nasal congestion, body aches, chills (x 2 days)   HPI Pt c/o sneezing, coughing, nasal congestion, drainage, and body aches x2 days. She has felt feverish, but has been using a temporal thermometer which she does not feel is accurate. Today she has a temp of 99.46F. She also c/o of some right ear pain/fullness that just started this AM and wheezing. States the cough is constant--does not get better or worse at any time of L[place]. She has been taking Mucinex, Vitamin C, Advil cold and sinus, and mucinex nasal spray which she says are providing no relief of symptoms.  She is worried this is the flu as her son and husband had the flu about 3 weeks ago. She did take Tamiflu at that time to prevent the flu, but she describes this as "the flu or the worst cold she's ever had". She is also a smoker (30- 45 pk year history). She quit once before, but started again a year ago and plans to quit again by the end of march.  Discussed options to help with smoking cessation, but pt says she has tried patches, Chantix and Wellbutrin already with no success.  Relevant past medical, surgical, family and social history reviewed and updated as indicated. Interim medical history since our last visit reviewed. Allergies and medications reviewed and updated.  Review of Systems  Constitutional: Positive for chills and fever. Negative for appetite change.  HENT: Positive for congestion, ear pain (right ear pain), rhinorrhea and sneezing. Negative for sore throat.   Respiratory: Positive for cough, shortness of breath and wheezing (when she is coughing). Negative for chest tightness.   Gastrointestinal: Negative for diarrhea,  nausea and vomiting.    Per HPI unless specifically indicated above        Objective:    BP 130/78   Pulse 96   Temp 99.3 F (37.4 C) (Oral)   Ht 5\' 5"  (1.651 m)   Wt 194 lb (88 kg)   BMI 32.28 kg/m   Wt Readings from Last 3 Encounters:  10/20/17 194 lb (88 kg)  10/02/17 191 lb (86.6 kg)  04/07/17 187 lb (84.8 kg)    Physical Exam  Constitutional: She is oriented to person, place, and time. She appears well-developed and well-nourished.  HENT:  Right Ear: External ear and ear canal normal. Tympanic membrane is injected.  Left Ear: Tympanic membrane, external ear and ear canal normal.  Nose: Rhinorrhea present. Right sinus exhibits no maxillary sinus tenderness and no frontal sinus tenderness. Left sinus exhibits no maxillary sinus tenderness and no frontal sinus tenderness.  Mouth/Throat: Mucous membranes are normal. Posterior oropharyngeal erythema present.  Cardiovascular: Normal rate, regular rhythm and normal heart sounds.  Pulmonary/Chest: Effort normal and breath sounds normal.  Neurological: She is alert and oriented to person, place, and time.   Rapid flu test for A & B-- negative     Assessment & Plan:   Problem List Items Addressed This Visit    None    Visit Diagnoses    Viral upper respiratory infection    -  Primary   Relevant Medications   benzonatate (TESSALON) 200 MG capsule   fluticasone (FLONASE) 50 MCG/ACT nasal spray   Other Relevant Orders   Veritor Flu A/B Waived (Completed)      Instruct patient to use Mucinex and Flonase for viral URI symptoms. Pt may continue vitamin C.  Order Ladona Ridgel for cough. Also, encourage fluid intake and rest. Strongly encourage smoking cessation.  Follow up plan: Return if symptoms worsen or fail to improve.  Counseling provided for all of the vaccine components Orders Placed This Encounter  Procedures  . Veritor Flu A/B Waived    Patient was seen and examined with Chaney Malling PA student,  agree with assessment and plan above. Caryl Pina, MD [facility] 10/26/2017, 9:36 PM

## 2017-11-17 ENCOUNTER — Other Ambulatory Visit: Payer: Self-pay | Admitting: Gynecology

## 2017-11-17 DIAGNOSIS — Z1231 Encounter for screening mammogram for malignant neoplasm of breast: Secondary | ICD-10-CM

## 2017-11-18 ENCOUNTER — Ambulatory Visit (INDEPENDENT_AMBULATORY_CARE_PROVIDER_SITE_OTHER): Payer: PRIVATE HEALTH INSURANCE | Admitting: Physician Assistant

## 2017-11-18 ENCOUNTER — Encounter: Payer: Self-pay | Admitting: Physician Assistant

## 2017-11-18 VITALS — BP 122/80 | HR 65 | Temp 98.5°F | Ht 64.5 in | Wt 191.0 lb

## 2017-11-18 DIAGNOSIS — Z720 Tobacco use: Secondary | ICD-10-CM

## 2017-11-18 DIAGNOSIS — H02403 Unspecified ptosis of bilateral eyelids: Secondary | ICD-10-CM | POA: Diagnosis not present

## 2017-11-18 DIAGNOSIS — Z8632 Personal history of gestational diabetes: Secondary | ICD-10-CM | POA: Insufficient documentation

## 2017-11-18 DIAGNOSIS — F429 Obsessive-compulsive disorder, unspecified: Secondary | ICD-10-CM | POA: Insufficient documentation

## 2017-11-18 DIAGNOSIS — Z7689 Persons encountering health services in other specified circumstances: Secondary | ICD-10-CM | POA: Diagnosis not present

## 2017-11-18 DIAGNOSIS — M5442 Lumbago with sciatica, left side: Secondary | ICD-10-CM | POA: Diagnosis not present

## 2017-11-18 DIAGNOSIS — Z8669 Personal history of other diseases of the nervous system and sense organs: Secondary | ICD-10-CM | POA: Insufficient documentation

## 2017-11-18 DIAGNOSIS — M1990 Unspecified osteoarthritis, unspecified site: Secondary | ICD-10-CM | POA: Insufficient documentation

## 2017-11-18 LAB — URINALYSIS, ROUTINE W REFLEX MICROSCOPIC
Bilirubin Urine: NEGATIVE
Hgb urine dipstick: NEGATIVE
Ketones, ur: NEGATIVE
Leukocytes, UA: NEGATIVE
Nitrite: NEGATIVE
RBC / HPF: NONE SEEN (ref 0–?)
Specific Gravity, Urine: 1.015 (ref 1.000–1.030)
Total Protein, Urine: NEGATIVE
Urine Glucose: NEGATIVE
Urobilinogen, UA: 0.2 (ref 0.0–1.0)
pH: 7.5 (ref 5.0–8.0)

## 2017-11-18 MED ORDER — CYCLOBENZAPRINE HCL 10 MG PO TABS: 10.0000 mg | ORAL_TABLET | Freq: Every day | ORAL | 1 refills | Status: DC

## 2017-11-18 NOTE — Progress Notes (Signed)
Mackenzie Meyers is a 47 y.o. female here to [place].  I acted as a Education administrator for [employer], PA-C Anselmo Pickler, LPN  History of Present Illness:   Chief Complaint  Patient presents with  . Establish Care    Medcost, no fasting    Acute Concerns: Low back pain radiating down L leg -- initially injured it at age 25 when standing/twisting suddenly, this flared 2-3 years ago when she had her daughter, has been seeing a Restaurant manager, fast food since June intermittently to help with her pain.  This most recent flare occurred a few days ago, she is taking 800 mg ibuprofen and flexeril nightly to help her muscles relax. She is going to see her chiropractor tomorrow. She denies urinary symptoms, n/v. Does have history of kidney stones.  Ptosis of both eyelids -- patient reports a family history of saggy, heavy upper eyelids. She does not have visual impairment from this yet, per her report. She anticipates that she will require surgery at some point to correct this. Tobacco abuse -- patient has been smoking since age 9. Started to cut back from 1.5 PPD to 0.5 PPD on Monday of this week. She has tried chantix and patches in the past without success.  Health Maintenance: Weight -- Weight: 191 lb (86.6 kg)   Depression screen [facility] 2/9 11/18/2017  Decreased Interest 0  Down, Depressed, Hopeless 0  PHQ - 2 Score 0  Altered sleeping -  Tired, decreased energy -  Change in appetite -  Feeling bad or failure about yourself  -  Trouble concentrating -  Moving slowly or fidgety/restless -  Suicidal thoughts -  PHQ-9 Score -    GAD 7 : Generalized Anxiety Score 11/18/2017 10/01/2016  Nervous, Anxious, on Edge 0 2  Control/stop worrying 0 1  Worry too much - different things 0 1  Trouble relaxing 0 3  Restless 0 3  Easily annoyed or irritable 0 1  Afraid - awful might happen 0 0  Total GAD 7 Score 0 11  Anxiety Difficulty Not difficult at all -    Other providers/specialists: [facility] --  Sleep Apnea   Past Medical History:  Diagnosis Date  . Anxiety    currently managed on CBD oil, was on medication briefly but made her OCD worse  . Arthritis    hands  . Breast mass, right 09/2012   removed because it was painful, was found to be calcified scar tissue  . Cervical dysplasia age 47  . GDM (gestational diabetes mellitus)    with last pregnancy  . Geographical tongue   . 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 History of kidney stones 10/2011  . 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 Hx of migraines   . OCD (obsessive compulsive disorder)    cleanliness with home and workspace  . Rosacea   . Sinus congestion 10/12/2012  . Sleep apnea 04/2017   patient is wearing C-pap  . Tobacco abuse 1989   max was 1.5 packs per [place]  . Vitamin D deficiency 10/2013   Value 29  . Vitiligo      Social History   Socioeconomic History  . Marital status: Married    Spouse name: Not on file  . Number of children: 3  . Years of education: Not on file  . Highest education level: Not on file  Occupational History  . Not on file  Social Needs  . Financial resource strain: Not on file  . Food insecurity:    Worry: Not on file    Inability: Not on file  . Transportation needs:    Medical: Not on file    Non-medical: Not on file  Tobacco Use  . Smoking status: Current Every [place]arte Smoker    Packs/Leiphart: 0.50    Last attempt to quit: 08/17/2012    Years since quitting: 5.2  . Smokeless tobacco: Never Used  . Tobacco comment: restarted 10/17 d/t husband losing job  Substance and Sexual Activity  . Alcohol use: Yes    Comment: rare, twice a year  . Drug use: No  . Sexual activity: Yes    Partners: Male    Birth control/protection: IUD    Comment: Mirena inserted 03-2015-1st intercourse 47 yo-More than 5 partners  Lifestyle  . Physical activity:    Days per week: Not on file    Minutes per session: Not on file  .  Stress: Not on file  Relationships  . Social connections:    Talks on phone: Not on file    Gets together: Not on file    Attends religious service: Not on file    Active member of club or organization: Not on file    Attends meetings of clubs or organizations: Not on file    Relationship status: Not on file  . Intimate partner violence:    Fear of current or ex partner: Not on file    Emotionally abused: Not on file    Physically abused: Not on file    Forced sexual activity: Not on file  Other Topics Concern  . Not on file  Social History Narrative   Homemaker   2 boys (44 and 78)   63.45 year old girl   Does Medieval recreator; now sews costumes    Past Surgical History:  Procedure Laterality Date  . ABDOMINOPLASTY  2008  . BREAST BIOPSY Right 10/15/2012   Procedure: BREAST BIOPSY;  Surgeon: Haywood Lasso, MD;  Location: [facility];  Service: General;  Laterality: Right;  removal right breast mass  . BREAST SURGERY     Lift  . CESAREAN SECTION  2004, 2007  . CESAREAN SECTION N/A 02/07/2015   Procedure: CESAREAN SECTION;  Surgeon: Jerelyn Charles, MD;  Location: [facility];  Service: Obstetrics;  Laterality: N/A;  EDD: 02/13/15   . dialation and cutterage    . GYNECOLOGIC CRYOSURGERY  age 22  . INTRAUTERINE DEVICE INSERTION  03/2015   Mirena at [facility]  . TONSILLECTOMY  2000    Family History  Problem Relation Age of Onset  . Arthritis Mother   . High Cholesterol Mother   . High blood pressure Mother   . Hearing loss Father   . High Cholesterol Father   . High blood pressure Father   . Cancer Maternal Grandfather        lymphoma  . Cancer Paternal Grandfather        liver  . Alcohol abuse Paternal Grandfather   . Lung cancer Maternal Grandmother        and maternal uncle; small cell   . Arthritis Maternal Grandmother   . Hyperlipidemia Maternal Grandmother   . Hypertension Maternal Grandmother   . Hearing loss Maternal Grandmother   .  Cancer Maternal Uncle        Lung cancer  . Hyperlipidemia Other   . Asthma Brother   . Alcohol abuse  Paternal Grandmother   . Hypertension Paternal Grandmother   . Colon cancer Neg Hx   . Breast cancer Neg Hx     Allergies  Allergen Reactions  . Ampicillin Hives  . Erythromycin Nausea And Vomiting  . Other Hives    SAUERKRAUT  . Wine [Alcohol] Hives  . Ciprofloxacin Hives and Rash     Current Medications:   Current Outpatient Medications:  .  Ascorbic Acid (VITAMIN C) 1000 MG tablet, Take 1,000 mg by mouth daily. , Disp: , Rfl:  .  cyclobenzaprine (FLEXERIL) 10 MG tablet, Take 1 tablet (10 mg total) by mouth at bedtime., Disp: 30 tablet, Rfl: 1 .  ibuprofen (ADVIL,MOTRIN) 200 MG tablet, Take 800 mg by mouth 2 (two) times daily. Patient takes 4 at a time , Disp: , Rfl:  .  levonorgestrel (MIRENA) 20 MCG/24HR IUD, 1 each by Intrauterine route once. Inserted by GYN on 03/2015 needs to be removed 03/2020., Disp: , Rfl:  .  mometasone (NASONEX) 50 MCG/ACT nasal spray, Place 2 sprays into the nose 2 (two) times daily., Disp: , Rfl:  .  OVER THE COUNTER MEDICATION, Take 500 mg by mouth daily. OTC CBD OIL, Disp: , Rfl:  .  valACYclovir (VALTREX) 500 MG tablet, Take 1 tablet (500 mg total) by mouth daily., Disp: 90 tablet, Rfl: 4 .  Vitamin D, Ergocalciferol, (DRISDOL) 50000 units CAPS capsule, Take 1 capsule (50,000 Units total) by mouth every 7 (seven) days., Disp: 12 capsule, Rfl: 0   Review of Systems:   Review of Systems  Constitutional: Negative.  Negative for chills, fever, malaise/fatigue and weight loss.  HENT: Negative.  Negative for hearing loss, sinus pain and sore throat.   Eyes: Negative.  Negative for blurred vision.  Respiratory: Negative.  Negative for cough and shortness of breath.   Cardiovascular: Negative.  Negative for chest pain, palpitations and leg swelling.  Gastrointestinal: Negative.  Negative for abdominal pain, constipation, diarrhea, heartburn, nausea and  vomiting.  Genitourinary: Negative.  Negative for dysuria, frequency and urgency.  Musculoskeletal: Negative for back pain, myalgias and neck pain.       Pt has a pinched Sciatica nerve on the left side x 1 week seeing chiropractor.  Skin: Negative.  Negative for itching and rash.  Neurological: Negative.  Negative for dizziness, tingling, seizures, loss of consciousness and headaches.  Endo/Heme/Allergies: Negative.  Negative for polydipsia.  Psychiatric/Behavioral: Negative.  Negative for depression. The patient is not nervous/anxious.     Vitals:   Vitals:   11/18/17 1343  BP: 122/80  Pulse: 65  Temp: 98.5 F (36.9 C)  TempSrc: Oral  SpO2: 97%  Weight: 191 lb (86.6 kg)  Height: 5' 4.5" (1.638 m)     Body mass index is 32.28 kg/m.  Physical Exam:   Physical Exam  Constitutional: She appears well-developed. She is cooperative.  Non-toxic appearance. She does not have a sickly appearance. She does not appear ill. No distress.  Eyes:  Ptosis of bilateral eyelids, no obstruction of sight  Cardiovascular: Normal rate, regular rhythm, S1 normal, S2 normal, normal heart sounds and normal pulses.  No LE edema  Pulmonary/Chest: Effort normal and breath sounds normal.  Abdominal: There is no CVA tenderness.  Musculoskeletal:  Decreased ROM 2/2 pain with flexion/extension, lateral side bends, or rotation. No bony tenderness. No evidence of erythema, rash or ecchymosis. Did not attempt straight leg raise 2/2 patient discomfort.    Neurological: She is alert. She has normal strength. No sensory deficit. Gait  abnormal. GCS eye subscore is 4. GCS verbal subscore is 5. GCS motor subscore is 6.  Reflex Scores:      Patellar reflexes are 2+ on the right side and 2+ on the left side. Skin: Skin is warm, dry and intact.  Psychiatric: She has a normal mood and affect. Her speech is normal and behavior is normal.  Nursing note and vitals reviewed.   Assessment and Plan:    Haneen was  seen today for establish care.  Diagnoses and all orders for this visit:  Encounter to establish care She is planning to return for a physical soon, at her convenience.   Acute left-sided low back pain with left-sided sciatica Will check UA today.  No red flags on my exam. Will refill flexeril today for patient per her request. Continue chiropractic care. If second opinion warranted, will refer to Dr. Teresa Coombs. -     Urinalysis, Routine w reflex microscopic  Tobacco abuse Currently working on smoking cessation.  Ptosis of both eyelids Currently not obstructing vision. Defer to plastic surgery if/when patient prefers.  Other orders -     cyclobenzaprine (FLEXERIL) 10 MG tablet; Take 1 tablet (10 mg total) by mouth at bedtime.   . Reviewed expectations re: course of current medical issues. . Discussed self-management of symptoms. . Outlined signs and symptoms indicating need for more acute intervention. . Patient verbalized understanding and all questions were answered. . See orders for this visit as documented in the electronic medical record. . Patient received an After-Visit Summary.   CMA or LPN served as scribe during this visit. History, Physical, and Plan performed by medical provider. Documentation and orders reviewed and attested to.   Inda Coke, PA-C

## 2017-11-18 NOTE — Patient Instructions (Addendum)
It was great to meet you!  If your back pain persists, please let us know.  Please return at your convenience for a physical exam.  If you need any assistance with smoking cessation, please call us!

## 2017-11-20 ENCOUNTER — Encounter: Payer: Self-pay | Admitting: Physician Assistant

## 2017-11-24 ENCOUNTER — Encounter: Payer: Self-pay | Admitting: Sports Medicine

## 2017-11-24 ENCOUNTER — Ambulatory Visit (INDEPENDENT_AMBULATORY_CARE_PROVIDER_SITE_OTHER): Payer: PRIVATE HEALTH INSURANCE | Admitting: Sports Medicine

## 2017-11-24 VITALS — BP 144/80 | HR 96 | Ht 64.5 in | Wt 197.0 lb

## 2017-11-24 DIAGNOSIS — M5416 Radiculopathy, lumbar region: Secondary | ICD-10-CM | POA: Diagnosis not present

## 2017-11-24 MED ORDER — METHYLPREDNISOLONE 4 MG PO TBPK: ORAL_TABLET | ORAL | 0 refills | Status: DC

## 2017-11-24 NOTE — Progress Notes (Signed)
Mackenzie Meyers. Mackenzie Meyers, [place] [facility] [phone]  Mackenzie Meyers - 47 y.o. female MRN [id number]  Date of birth: 05-24-1971  Visit Date: 11/24/2017  PCP: Dettinger, Fransisca Kaufmann, MD   Referred by: Dettinger, Fransisca Kaufmann, MD  Scribe for today's visit: Wendy Poet, LAT, ATC     SUBJECTIVE:  Mackenzie Meyers is here for New Patient (Initial Visit) (L Low back pain w/ sciatica) .   Her low back pain and L leg symptoms INITIALLY: Began on November 10, 2017 w/ no MOI. She states that her spine felt "compressed."  She went to see her chiropractor 2 days later and the sciatica symptoms started after she saw him. Described as severe sharp, burning and throbbing, radiating to L lower leg w/ N/T noted in her L foot. Worsened with sitting Improved with IBU but only lasts for 3-4 hours Additional associated symptoms include: radiating pain into the L LE w/ N/T into the L foot    At this time symptoms show no change compared to onset. She has been taking IBU and Flexeril and goes to a chiropractor.  She has been seeing a chiropractor for the past 2 weeks w/ no relief.  ROS Denies night time disturbances. Denies fevers, chills, or night sweats. Denies unexplained weight loss. Denies personal history of cancer. Denies changes in bowel or bladder habits. Denies recent unreported falls. Denies new or worsening dyspnea or wheezing. Denies headaches or dizziness.  Reports numbness, tingling or weakness  In the extremities.  Denies dizziness or presyncopal episodes Denies lower extremity edema    HISTORY & PERTINENT PRIOR DATA:  Prior History reviewed and updated per electronic medical record.  Significant/pertinent history, findings, studies include:  reports that she has been smoking.  She has been smoking about 0.50 packs per Sch[place]. She has never used smokeless tobacco. No results for input(s): HGBA1C, LABURIC, CREATINE in the last 8760 hours. No specialty  comments available. Problem  Lumbar Back Pain With Radiculopathy Affecting Left Lower Extremity   X-rays obtained at the chiropractor in June 2018 that were positive for only mild degenerative changes     OBJECTIVE:  VS:  HT:5' 4.5" (163.8 cm)   WT:197 lb (89.4 kg)  BMI:33.31    BP:(Abnormal) 144/80  HR:96bpm  TEMP: ( )  RESP:98 %   PHYSICAL EXAM: Constitutional: WDWN, Non-toxic appearing. Psychiatric: Alert & appropriately interactive.  Not depressed or anxious appearing. Respiratory: No increased work of breathing.  Trachea Midline Eyes: Pupils are equal.  EOM intact without nystagmus.  No scleral icterus  Vascular Exam: warm to touch no edema  lower extremity neuro exam: diminished strength in dorsiflexion of the left foot otherwise 5 out of 5 strength diminished DTR in the left Achilles absent reduced sensation in the L5 distribution on the left, otherwise intact.  MSK Exam: Markedly positive straight leg raise on the left with significant pain with going from sit to stand.  She has bilateral paraspinal muscle spasms and tenderness.  Minimal pain with compression of the popliteal space.  Pain with greater sciatic notch palpation.   ASSESSMENT & PLAN:   1. Lumbar back pain with radiculopathy affecting left lower extremity     PLAN: Given the weakness and diminished reflexes patella is been going on and undergoing conservative care further diagnostic evaluation with MRI of the lumbar spine is indicated.  We will plan to follow-up with her after this is obtained to begin a Medrol Dosepak to  help with both pain and hopefully from an anti-inflammatory effect.  If any worsening symptoms or red flags were reviewed and she will call or go to emergency department if any evidence of cauda equina but I do not think is the case at this time.  Follow-up: Return for MRI results review.      Please see additional documentation for Objective, Assessment and Plan sections. Pertinent  additional documentation may be included in corresponding procedure notes, imaging studies, problem based documentation and patient instructions. Please see these sections of the encounter for additional information regarding this visit.  CMA/ATC served as Education administrator during this visit. History, Physical, and Plan performed by medical provider. Documentation and orders reviewed and attested to.      Gerda Diss, [facility] Physician

## 2017-11-24 NOTE — Patient Instructions (Signed)

## 2017-11-30 ENCOUNTER — Ambulatory Visit
Admission: RE | Admit: 2017-11-30 | Discharge: 2017-11-30 | Disposition: A | Discharge status: Home / Self Care | Payer: PRIVATE HEALTH INSURANCE | Source: Ambulatory Visit | Attending: Sports Medicine | Admitting: Sports Medicine

## 2017-11-30 DIAGNOSIS — M5416 Radiculopathy, lumbar region: Secondary | ICD-10-CM

## 2017-12-01 ENCOUNTER — Encounter: Payer: PRIVATE HEALTH INSURANCE | Admitting: Physician Assistant

## 2017-12-01 DIAGNOSIS — Z0289 Encounter for other administrative examinations: Secondary | ICD-10-CM

## 2017-12-02 ENCOUNTER — Encounter: Payer: Self-pay | Admitting: Sports Medicine

## 2017-12-02 ENCOUNTER — Ambulatory Visit (INDEPENDENT_AMBULATORY_CARE_PROVIDER_SITE_OTHER): Payer: PRIVATE HEALTH INSURANCE | Admitting: Sports Medicine

## 2017-12-02 VITALS — BP 110/80 | HR 92 | Ht 64.5 in | Wt 195.0 lb

## 2017-12-02 DIAGNOSIS — M5137 Other intervertebral disc degeneration, lumbosacral region: Secondary | ICD-10-CM | POA: Diagnosis not present

## 2017-12-02 DIAGNOSIS — M5416 Radiculopathy, lumbar region: Secondary | ICD-10-CM

## 2017-12-02 MED ORDER — GABAPENTIN 300 MG PO CAPS: 300.0000 mg | ORAL_CAPSULE | Freq: Three times a day (TID) | ORAL | 2 refills | Status: DC

## 2017-12-02 NOTE — Progress Notes (Signed)
Juanda Bond. Denni France, [place] at [facility] [phone]  Mackenzie [facility] - 47 y.o. female MRN [id number]  Date of birth: Sep 23, 1970  Visit Date: 12/02/2017  PCP: Dettinger, Fransisca Kaufmann, MD   Referred by: Dettinger, Fransisca Kaufmann, MD  Scribe for today's visit: Josepha Pigg, CMA     SUBJECTIVE:  Mackenzie Meyers is here for Follow-up (LBP w/ left-sided radiculopathy)  11/24/2017: Her low back pain and L leg symptoms INITIALLY: Began on November 10, 2017 w/ no MOI. She states that her spine felt "compressed."  She went to see her chiropractor 2 days later and the sciatica symptoms started after she saw him. Described as severe sharp, burning and throbbing, radiating to L lower leg w/ N/T noted in her L foot. Worsened with sitting Improved with IBU but only lasts for 3-4 hours Additional associated symptoms include: radiating pain into the L LE w/ N/T into the L foot   At this time symptoms show no change compared to onset. She has been taking IBU and Flexeril and goes to a chiropractor.  She has been seeing a chiropractor for the past 2 weeks w/ no relief.  12/02/2017: Compared to the last office visit, her previously described symptoms show no change  Current symptoms are severe & are radiating to LLE w/ N/T in L foot She has completed Medrol Dosepak and got about 2 days of relief. She has been taking IBU or Tylenol q4h with some relief. She has been taking Flexeril at bedtime. She continues to have sleep disturbance even with taking Flexeril.   She has noticed increased pain in the L calf since last visit. She denies swelling, increased warmth, erythema.   MRI L-spine 11/30/17: IMPRESSION: 1. Moderate to prominent impingement at L5-S1 due to a disc extrusion or disc fragment in the left lateral recess, central disc protrusion, degenerative subluxation, and facet arthropathy as detailed above. 2. Degenerative disc disease at L4-5 does not cause  significant impingement.  ROS Reports night time disturbances. Denies fevers, chills, or night sweats. Denies unexplained weight loss. Denies personal history of cancer. Denies changes in bowel or bladder habits. Denies recent unreported falls. Denies new or worsening dyspnea or wheezing. Denies headaches or dizziness.  Reports numbness, tingling or weakness  In the extremities.  Denies dizziness or presyncopal episodes Denies lower extremity edema    HISTORY & PERTINENT PRIOR DATA:  Prior History reviewed and updated per electronic medical record.  Significant/pertinent history, findings, studies include:  reports that she has been smoking.  She has been smoking about 0.50 packs per [place]. She has never used smokeless tobacco. Recent Labs    12/19/17 0846  HGBA1C 5.7   No specialty comments available. No problems updated.  OBJECTIVE:  VS:  HT:5' 4.5" (163.8 cm)   WT:195 lb (88.5 kg)  BMI:32.97    BP:110/80  HR:92bpm  TEMP: ( )  RESP:98 %   PHYSICAL EXAM: Constitutional: WDWN, Non-toxic appearing. Psychiatric: Alert & appropriately interactive.  Not depressed or anxious appearing. Respiratory: No increased work of breathing.  Trachea Midline Eyes: Pupils are equal.  EOM intact without nystagmus.  No scleral icterus  Vascular Exam: warm to touch no edema  lower extremity neuro exam: unremarkable normal strength normal sensation normal reflexes  MSK Exam: Bilateral negative straight leg raises.  She has pain with palpation of the paraspinal musculature.  She has slightly difficult time with heel toe walking but no significant reflex or strength of normality.  ASSESSMENT & PLAN:   1. Degeneration of lumbar or lumbosacral intervertebral disc   2. Lumbar back pain with radiculopathy affecting left lower extremity     PLAN: Given the findings on the MRI we will plan to refer her to Dr. Laurence Spates for a final steroid injection.  We will follow-up with her in 6  weeks and check on her progress.  Titration of gabapentin today.  Follow-up: Return in about 6 weeks (around 01/13/2018).      Please see additional documentation for Objective, Assessment and Plan sections. Pertinent additional documentation may be included in corresponding procedure notes, imaging studies, problem based documentation and patient instructions. Please see these sections of the encounter for additional information regarding this visit.  CMA/ATC served as Education administrator during this visit. History, Physical, and Plan performed by medical provider. Documentation and orders reviewed and attested to.      Gerda Diss, [place] [facility] Physician

## 2017-12-02 NOTE — Patient Instructions (Signed)
[facility] Address: [address], [place], [place] [address] Phone: [phone]

## 2017-12-03 ENCOUNTER — Ambulatory Visit
Admission: RE | Admit: 2017-12-03 | Discharge: 2017-12-03 | Disposition: A | Discharge status: Home / Self Care | Payer: BLUE CROSS/BLUE SHIELD | Source: Ambulatory Visit | Attending: Gynecology | Admitting: Gynecology

## 2017-12-03 ENCOUNTER — Encounter: Payer: Self-pay | Admitting: Physician Assistant

## 2017-12-03 ENCOUNTER — Encounter: Payer: Self-pay | Admitting: Nurse Practitioner

## 2017-12-03 DIAGNOSIS — Z1231 Encounter for screening mammogram for malignant neoplasm of breast: Secondary | ICD-10-CM

## 2017-12-04 ENCOUNTER — Other Ambulatory Visit: Payer: Self-pay | Admitting: Gynecology

## 2017-12-04 ENCOUNTER — Encounter (INDEPENDENT_AMBULATORY_CARE_PROVIDER_SITE_OTHER): Payer: Self-pay | Admitting: Physical Medicine and Rehabilitation

## 2017-12-04 ENCOUNTER — Telehealth: Payer: Self-pay | Admitting: *Deleted

## 2017-12-04 ENCOUNTER — Encounter: Payer: Self-pay | Admitting: *Deleted

## 2017-12-04 ENCOUNTER — Ambulatory Visit (INDEPENDENT_AMBULATORY_CARE_PROVIDER_SITE_OTHER): Payer: PRIVATE HEALTH INSURANCE | Admitting: Physical Medicine and Rehabilitation

## 2017-12-04 ENCOUNTER — Ambulatory Visit (INDEPENDENT_AMBULATORY_CARE_PROVIDER_SITE_OTHER): Payer: PRIVATE HEALTH INSURANCE

## 2017-12-04 VITALS — BP 132/72 | HR 91 | Temp 98.6°F

## 2017-12-04 DIAGNOSIS — N63 Unspecified lump in unspecified breast: Secondary | ICD-10-CM

## 2017-12-04 DIAGNOSIS — M5116 Intervertebral disc disorders with radiculopathy, lumbar region: Secondary | ICD-10-CM

## 2017-12-04 DIAGNOSIS — M5416 Radiculopathy, lumbar region: Secondary | ICD-10-CM | POA: Diagnosis not present

## 2017-12-04 MED ORDER — METHYLPREDNISOLONE ACETATE 80 MG/ML IJ SUSP
80.0000 mg | Freq: Once | INTRAMUSCULAR | Status: AC
  Administered 2017-12-04: 80 mg

## 2017-12-04 NOTE — Patient Instructions (Signed)

## 2017-12-04 NOTE — Telephone Encounter (Signed)
Patient called and left message stating a new breast issues, I tried to call pt back, but she was in for a doctors appointment. I sent her a my chart message relaying she will need an OV.

## 2017-12-04 NOTE — Progress Notes (Signed)
 .  Numeric Pain Rating Scale and Functional Assessment Average Pain 8   In the last MONTH (on 0-10 scale) has pain interfered with the following?  1. General activity like being  able to carry out your everyday physical activities such as walking, climbing stairs, carrying groceries, or moving a chair?  Rating(6)   +Driver, -BT, -Dye Allergies.  

## 2017-12-05 NOTE — Progress Notes (Signed)
[facility]  PATIENT: Mackenzie Meyers DOB: [birth year]   REASON FOR VISIT: Follow-up for CPAP compliance HISTORY FROM: Patient    HISTORY OF PRESENT ILLNESS:UPDATE 4/15/2019CM Mackenzie Meyers, 47 year old female returns for follow-up for CPAP compliance.  She has been doing well with her CPAP.  She has a herniated disc and just received steroid injections last Thursday.  Compliance data dated 11/04/2017-12/03/2017 shows compliance greater than 4 hours 83%.  Average usage 7 hours 26 minutes set pressure 6 cm.  EPR level 3 AHI 0.3 ESS 6.  Patient says she did not use her CPAP for 5 days of the month because she was on a camping trip without electricity.  She returns for reevaluation   Mackenzie Meyers is a 47 y.o. female , was seen here as a referral from Dr. Warrick Parisian for a sleep evaluation, today is follow up on her sleep study.  With CPAP area in a study from 11/15/2016 with very mild apnea and may say at an AHI of 6.4 per hour but during REM sleep accentuated to 33.3 per hour. Her Epworth sleepiness score was endorsed at 15 points and for this reason he tried CPAP. She came back for a CPAP titration on 12/06/2016, tolerated CPAP very well and needed only 6 cm water. She was initially fitted with an air fit P 10 and small size, she states that she saw developed irritation of the nostrils and was changed to a nasal mask. Nasal mask no pushes on the bridge of her nose seems to aggravate her sinuses restrict her breathing. She will get in touch with her DM E, [employer], and see if she can switch to a wisp or to a dream wear mask.  Mackenzie Meyers also did a very good effort at compliance, over the last 30 days she used the device every [place], 29 days over 4 hours, which a compliance of 97%, average of 7 hours and 32 minutes of use, CPAP is set at 6 cm water pressure with 3 cm EPR and her residual AHI is only 0.5 apneas per hour. There are no desaturations in o2 ,according to her CPAP titration study  ,while  the patient uses CPAP.      3/ 2018 According to the referral notes the patient is concerned about fatigue and her feeling of lacking energy. She feels exhausted many days she has diffuse joint aches and pains and has been seen by primary care and rheumatology, has had lupus testing and thyroid checks. [name] has been acutely developing but rather gradually over time. For many years she lacked insurance so she couldn't have it checked out. She will sleep at night but still will have the need to take naps in the daytime on those days where she is not working. He was taking a nap she doesn't have issues to fall asleep at night. Her husband has told her that she is snores loudly, she wakes up multiple times at night it does not feel like she gets restorative sleep. She has a history of some anxiety and OCD but feels it is somewhat under control and she denies chest pain, palpitations, diaphoresis or dizziness. Once a week she may wake up with headaches but she has many other days with headaches. The headaches do not wake her up. Headaches are usually located above the right eye and temple.  She had given birth to her third child 18 month ago, at age 89 , but fatigue proceeded the pregnancy.   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 Her youngest daughter naps at 75 AM and she takes a nap alongside for 1-1.5 hours .  She may sleep up to 3 hours while the youngest, [name],  is in daycare. She has no trouble sleeping again at her  nocturnal Bedtime, which  is between 9 and 9:30 PM. Her marital bedroom is cool , quiet and dark. Dog " Abby"  sleeps in the bedroom, but not in the bed.  She prefers to sleep on her side, on one pillow.   REVIEW OF SYSTEMS: Full 14 system review of systems performed and notable only for those listed, all others are neg:  Constitutional: neg    Cardiovascular: neg Ear/Nose/Throat: neg  Skin: neg Eyes: neg Respiratory: neg Gastroitestinal: neg  Hematology/Lymphatic: neg  Endocrine: neg Musculoskeletal:neg Allergy/Immunology: neg Neurological: neg Psychiatric: neg Sleep : Obstructive sleep apnea with CPAP   ALLERGIES: Allergies  Allergen Reactions  . Ampicillin Hives  . Erythromycin Nausea And Vomiting  . Other Hives    SAUERKRAUT  . Wine [Alcohol] Hives  . Ciprofloxacin Hives and Rash    HOME MEDICATIONS: Outpatient Medications Prior to Visit  Medication Sig Dispense Refill  . acetaminophen (TYLENOL) 325 MG tablet Take 650 mg by mouth every 4 (four) hours as needed.    Marland Kitchen acetaminophen (TYLENOL) 500 MG tablet Take 1,000 mg by mouth every 8 (eight) hours as needed (alternated with motrin).    . Ascorbic Acid (VITAMIN C) 1000 MG tablet Take 1,000 mg by mouth daily.     . cyclobenzaprine (FLEXERIL) 10 MG tablet Take 1 tablet (10 mg total) by mouth at bedtime. 30 tablet 1  . gabapentin (NEURONTIN) 300 MG capsule Take 1 capsule (300 mg total) by mouth 3 (three) times daily. 90 capsule 2  . ibuprofen (ADVIL,MOTRIN) 200 MG tablet Take 800 mg by mouth every 8 (eight) hours as needed. Patient takes 4 at a time     . levonorgestrel (MIRENA) 20 MCG/24HR IUD 1 each by Intrauterine route once. Inserted by GYN on 03/2015 needs to be removed 03/2020.    . mometasone (NASONEX) 50 MCG/ACT nasal spray Place 2 sprays into the nose 2 (two) times daily.    Marland Kitchen OVER THE COUNTER MEDICATION Take 500 mg by mouth daily. OTC CBD OIL    . valACYclovir (VALTREX) 500 MG tablet Take 1 tablet (500 mg total) by mouth daily. 90 tablet 4  . Vitamin D, Ergocalciferol, (DRISDOL) 50000 units CAPS capsule Take 1 capsule (50,000 Units total) by mouth every 7 (seven) days. 12 capsule 0   No facility-administered medications prior to visit.     PAST MEDICAL HISTORY: Past Medical History:  Diagnosis Date  . Anxiety    currently managed on CBD oil, was on  medication briefly but made her OCD worse  . Arthritis    hands  . Breast mass, right 09/2012   removed because it was painful, was found to be calcified scar tissue  . Cervical dysplasia age 30  . GDM (gestational diabetes mellitus)    with last pregnancy  . Geographical tongue   . 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 History of kidney stones 10/2011  . 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 Hx of migraines   .  OCD (obsessive compulsive disorder)    cleanliness with home and workspace  . Rosacea   . Sinus congestion 10/12/2012  . Sleep apnea 04/2017   patient is wearing C-pap  . Tobacco abuse 1989   max was 1.5 packs per [place]  . Vitamin D deficiency 10/2013   Value 29  . Vitiligo     PAST SURGICAL HISTORY: Past Surgical History:  Procedure Laterality Date  . ABDOMINOPLASTY  2008  . BREAST BIOPSY Right 10/15/2012   Procedure: BREAST BIOPSY;  Surgeon: Haywood Lasso, MD;  Location: [facility];  Service: General;  Laterality: Right;  removal right breast mass  . BREAST SURGERY     Lift  . CESAREAN SECTION  2004, 2007  . CESAREAN SECTION N/A 02/07/2015   Procedure: CESAREAN SECTION;  Surgeon: Jerelyn Charles, MD;  Location: [facility];  Service: Obstetrics;  Laterality: N/A;  EDD: 02/13/15   . dialation and cutterage    . GYNECOLOGIC CRYOSURGERY  age 72  . INTRAUTERINE DEVICE INSERTION  03/2015   Mirena at [facility]  . TONSILLECTOMY  2000    FAMILY HISTORY: Family History  Problem Relation Age of Onset  . Arthritis Mother   . High Cholesterol Mother   . High blood pressure Mother   . Hearing loss Father   . High Cholesterol Father   . High blood pressure Father   . Cancer Maternal Grandfather        lymphoma  . Cancer Paternal Grandfather        liver  . Alcohol abuse Paternal Grandfather   . Lung cancer Maternal Grandmother        and maternal uncle; small cell   . Arthritis  Maternal Grandmother   . Hyperlipidemia Maternal Grandmother   . Hypertension Maternal Grandmother   . Hearing loss Maternal Grandmother   . Cancer Maternal Uncle        Lung cancer  . Hyperlipidemia Other   . Asthma Brother   . Alcohol abuse Paternal Grandmother   . Hypertension Paternal Grandmother   . Colon cancer Neg Hx   . Breast cancer Neg Hx     SOCIAL HISTORY: Social History   Socioeconomic History  . Marital status: Married    Spouse name: Not on file  . Number of children: 3  . Years of education: Not on file  . Highest education level: Not on file  Occupational History  . Not on file  Social Needs  . Financial resource strain: Not on file  . Food insecurity:    Worry: Not on file    Inability: Not on file  . Transportation needs:    Medical: Not on file    Non-medical: Not on file  Tobacco Use  . Smoking status: Current Every [place] Smoker    Packs/[place]: 0.50    Last attempt to quit: 08/17/2012    Years since quitting: 5.3  . Smokeless tobacco: Never Used  . Tobacco comment: restarted 10/17 d/t husband losing job  Substance and Sexual Activity  . Alcohol use: Yes    Comment: rare, twice a year  . Drug use: No  . Sexual activity: Yes    Partners: Male    Birth control/protection: IUD    Comment: Mirena inserted 03-2015-1st intercourse 47 yo-More than 5 partners  Lifestyle  . Physical activity:    Days per week: Not on file    Minutes per session: Not on file  . Stress: Not on file  Relationships  .  Social connections:    Talks on phone: Not on file    Gets together: Not on file    Attends religious service: Not on file    Active member of club or organization: Not on file    Attends meetings of clubs or organizations: Not on file    Relationship status: Not on file  . Intimate partner violence:    Fear of current or ex partner: Not on file    Emotionally abused: Not on file    Physically abused: Not on file    Forced sexual activity: Not on file    Other Topics Concern  . Not on file  Social History Narrative   Homemaker   2 boys (49 and 40)   56.45 year old girl   Does Medieval recreator; now sews costumes     PHYSICAL EXAM  Vitals:   12/08/17 1236  BP: 129/73  Pulse: 87  Weight: 197 lb (89.4 kg)  Height: 5' 4.5" (1.638 m)   Body mass index is 33.29 kg/m.  Generalized: Well developed, obese female in no acute distress  Head: normocephalic and atraumatic,. Oropharynx benign  Neck: Supple,  Musculoskeletal: No deformity   Neurological examination   Mentation: Alert oriented to time, place, history taking. Attention span and concentration appropriate. Recent and remote memory intact.  Follows all commands speech and language fluent. ESS 6.  Cranial nerve II-XII: Pupils were equal round reactive to light extraocular movements were full, visual field were full on confrontational test. Facial sensation and strength were normal. hearing was intact to finger rubbing bilaterally. Uvula tongue midline. head turning and shoulder shrug were normal and symmetric.Tongue protrusion into cheek strength was normal. Motor: normal bulk and tone, full strength in the BUE, BLE,  Sensory: normal and symmetric to light touch,  Gait and Station: Rising up from seated position without assistance, normal stance,  moderate stride, good arm swing, smooth turning,. DIAGNOSTIC DATA (LABS, IMAGING, TESTING) - I reviewed patient records, labs, notes, testing and imaging myself where available.  Lab Results  Component Value Date   WBC 8.1 10/05/2016   HGB 14.4 10/05/2016   HCT 43.6 10/05/2016   MCV 85 10/05/2016   PLT 307 10/05/2016      Component Value Date/Time   NA 140 10/05/2016 0833   K 4.7 10/05/2016 0833   CL 101 10/05/2016 0833   CO2 23 10/05/2016 0833   GLUCOSE 86 10/05/2016 0833   GLUCOSE 89 02/06/2015 0840   BUN 11 10/05/2016 0833   CREATININE 0.70 10/05/2016 0833   CREATININE 0.88 06/15/2013 1037   CALCIUM 9.1 10/05/2016  0833   PROT 6.7 10/05/2016 0833   ALBUMIN 4.3 10/05/2016 0833   AST 14 10/05/2016 0833   ALT 10 10/05/2016 0833   ALKPHOS 69 10/05/2016 0833   BILITOT 0.4 10/05/2016 0833   GFRNONAA 106 10/05/2016 0833   GFRAA 123 10/05/2016 0833   Lab Results  Component Value Date   CHOL 162 10/05/2016   HDL 44 10/05/2016   LDLCALC 107 (H) 10/05/2016   TRIG 55 10/05/2016   CHOLHDL 3.7 10/05/2016    Lab Results  Component Value Date   TSH 1.780 10/05/2016      ASSESSMENT AND PLAN  47 y.o. year old female  has a past medical history of obstructive sleep apnea here for CPAP compliance. Data dated 11/04/2017-12/03/2017 shows compliance greater than 4 hours 83%.  Average usage 7 hours 26 minutes set pressure 6 cm.  EPR level 3 AHI 0.3  ESS 6.The patient is a current patient of Dr. Brett Fairy  who is out of the office today . This note is sent to the work in doctor.      PLAN: CPAP compliance 83% greater than 4 hours(patient did not use for 5 days when she was on a camping trip without electricity) Continue same settings Follow-up yearly and as needed Dennie Bible, [facility], [place], [facility] [address] [place], [place] [address] [phone]

## 2017-12-05 NOTE — Telephone Encounter (Signed)
Dr.Fontaine please see patient response, it appears the breast center has placed orders in epic for you to co-sign if you approve. Or do you recommend her just schedule visit with you for breast exam as we normally would do? Please advise

## 2017-12-05 NOTE — Telephone Encounter (Signed)
I just saw her in February where her breast exam was normal.  If she feels comfortable that there are no changes since that exam and that this is just an area that she feels is from the old scar and no new changes to her then I would proceed with a diagnostic mammography.  If she has any questions then office visit for breast exam.

## 2017-12-08 ENCOUNTER — Ambulatory Visit (INDEPENDENT_AMBULATORY_CARE_PROVIDER_SITE_OTHER): Payer: PRIVATE HEALTH INSURANCE | Admitting: Nurse Practitioner

## 2017-12-08 ENCOUNTER — Encounter: Payer: Self-pay | Admitting: Nurse Practitioner

## 2017-12-08 ENCOUNTER — Telehealth: Payer: Self-pay | Admitting: Sports Medicine

## 2017-12-08 ENCOUNTER — Telehealth: Payer: Self-pay | Admitting: Nurse Practitioner

## 2017-12-08 DIAGNOSIS — Z9989 Dependence on other enabling machines and devices: Secondary | ICD-10-CM

## 2017-12-08 DIAGNOSIS — G4710 Hypersomnia, unspecified: Secondary | ICD-10-CM

## 2017-12-08 DIAGNOSIS — G4719 Other hypersomnia: Secondary | ICD-10-CM

## 2017-12-08 DIAGNOSIS — G4733 Obstructive sleep apnea (adult) (pediatric): Secondary | ICD-10-CM | POA: Insufficient documentation

## 2017-12-08 NOTE — Patient Instructions (Signed)
CPAP compliance 83% greater than 4 hours  Continue same settings  Follow-up yearly and as needed  

## 2017-12-08 NOTE — Telephone Encounter (Signed)
4/15- Pt saw Cecille Rubin, Hoyle Sauer requested 1 yr follow up, pt said she would call to schedule

## 2017-12-08 NOTE — Telephone Encounter (Signed)
Copied from [place] [id number]. Topic: Quick Communication - Rx Refill/Question >> Dec 08, 2017  1:02 PM Waylan Rocher, [place] L wrote: Medication: Lidocaine patch  Has the patient contacted their pharmacy? No. (new request) (Agent: If no, request that the patient contact the pharmacy for the refill.) Preferred Pharmacy (with phone number or street name): [id number] - [place], [place] - [address] [place] [address] Phone: [phone] Fax: [phone] Agent: Please be advised that RX refills may take up to 3 business days. We ask that you follow-up with your pharmacy.  Patient is requesting lidocaine patch for the first time.

## 2017-12-08 NOTE — Telephone Encounter (Signed)
Pt requesting new rx for Lidocaine patch, which has not been prescribed previously. Pt seen for OV with Dr. Paulla Fore on 12/02/17 for lumbar back pain.   PCP: Inda Coke, PA  [facility]      [place]

## 2017-12-09 NOTE — Telephone Encounter (Signed)
Pt had epidural inj 12/04/17 with Dr. Ernestina Patches. Forwarding to Dr. Paulla Fore to advise if OK to prescribe.

## 2017-12-11 NOTE — Telephone Encounter (Signed)
Okay to prescribe lidocaine patches?

## 2017-12-17 ENCOUNTER — Telehealth (INDEPENDENT_AMBULATORY_CARE_PROVIDER_SITE_OTHER): Payer: Self-pay | Admitting: Physical Medicine and Rehabilitation

## 2017-12-17 ENCOUNTER — Other Ambulatory Visit: Payer: Self-pay

## 2017-12-17 MED ORDER — LIDOCAINE 5 % EX PTCH: 1.0000 | MEDICATED_PATCH | CUTANEOUS | 0 refills | Status: DC

## 2017-12-17 NOTE — Procedures (Signed)
S1 Lumbosacral Transforaminal Epidural Steroid Injection - Sub-Pedicular Approach with Fluoroscopic Guidance   Patient: Mackenzie Meyers      Date of Birth: 06-05-1971 MRN: [id number] PCP: Inda Coke, PA      Visit Date: 12/04/2017   Universal Protocol:    Date/Time: 04/24/195:49 AM  Consent Given By: the patient  Position:  PRONE  Additional Comments: Vital signs were monitored before and after the procedure. Patient was prepped and draped in the usual sterile fashion. The correct patient, procedure, and site was verified.   Injection Procedure Details:  Procedure Site One Meds Administered:  Meds ordered this encounter  Medications  . methylPREDNISolone acetate (DEPO-MEDROL) injection 80 mg    Laterality: Left  Location/Site:  S1 Foramen   Needle size: 22 ga.  Needle type: Spinal  Needle Placement: Transforaminal  Findings:   -Comments: Excellent flow of contrast along the nerve and into the epidural space.  Procedure Details: After squaring off the sacral end-plate to get a true AP view, the C-arm was positioned so that the best possible view of the S1 foramen was visualized. The soft tissues overlying this structure were infiltrated with 2-3 ml. of 1% Lidocaine without Epinephrine.    The spinal needle was inserted toward the target using a "trajectory" view along the fluoroscope beam.  Under AP and lateral visualization, the needle was advanced so it did not puncture dura. Biplanar projections were used to confirm position. Aspiration was confirmed to be negative for CSF and/or blood. A 1-2 ml. volume of Isovue-250 was injected and flow of contrast was noted at each level. Radiographs were obtained for documentation purposes.   After attaining the desired flow of contrast documented above, a 0.5 to 1.0 ml test dose of 0.25% Marcaine was injected into each respective transforaminal space.  The patient was observed for 90 seconds post injection.  After no sensory  deficits were reported, and normal lower extremity motor function was noted,   the above injectate was administered so that equal amounts of the injectate were placed at each foramen (level) into the transforaminal epidural space.   Additional Comments:  The patient tolerated the procedure well Dressing: Band-Aid    Post-procedure details: Patient was observed during the procedure. Post-procedure instructions were reviewed.  Patient left the clinic in stable condition.

## 2017-12-17 NOTE — Telephone Encounter (Signed)
Need to know how much it helped.  If it was anywhere close to 40-50% then I would go ahead and repeat.  If it did not help at all I am not sure repeating would be the best answer.

## 2017-12-17 NOTE — Telephone Encounter (Signed)
Rx sent to pharmacy. Pt aware.  

## 2017-12-17 NOTE — Telephone Encounter (Signed)
Spoke with patient regarding another issue and she advised the her last injection with Dr. Ernestina Patches was beneficial, she got about 40% relief but is still having pain and feels that another injection would really give her the relief that she is looking for. Please contact pt if appropriate and schedule.

## 2017-12-17 NOTE — Progress Notes (Signed)
Mackenzie Meyers - 47 y.o. female MRN [id number]  Date of birth: September 09, 1970  Office Visit Note: Visit Date: 12/04/2017 PCP: Inda Coke, PA Referred by: Inda Coke, [place]  Subjective: Chief Complaint  Patient presents with  . Lower Back - Pain  . Left Foot - Pain  . Left Leg - Pain   HPI: Mackenzie Meyers is a 47 year old female that comes in today at the request of Dr. Teresa Coombs for left S1 transforaminal epidural steroid injection.  This will be diagnostic hopefully therapeutic.  Patient has herniated disc at L5-S1 with left-sided lateral recess narrowing and disc fragment.  She is having significant pain is been occurring over the last month and really failing all manner of treatment.  Pain is fairly severe.  MRI is reviewed below.  I would suggest repeat injection in a few weeks depending on how much relief she gets with this injection.   ROS Otherwise per HPI.  Assessment & Plan: Visit Diagnoses:  1. Lumbar radiculopathy   2. Radiculopathy due to lumbar intervertebral disc disorder     Plan: No additional findings.   Meds & Orders:  Meds ordered this encounter  Medications  . methylPREDNISolone acetate (DEPO-MEDROL) injection 80 mg    Orders Placed This Encounter  Procedures  . XR C-ARM NO REPORT  . Epidural Steroid injection    Follow-up: Return if symptoms worsen or fail to improve, for Dr. Paulla Fore.   Procedures: No procedures performed  S1 Lumbosacral Transforaminal Epidural Steroid Injection - Sub-Pedicular Approach with Fluoroscopic Guidance   Patient: Mackenzie Meyers      Date of Birth: [birth year] MRN: [id number] PCP: Inda Coke, PA      Visit Date: 12/04/2017   Universal Protocol:    Date/Time: 04/24/195:49 AM  Consent Given By: the patient  Position:  PRONE  Additional Comments: Vital signs were monitored before and after the procedure. Patient was prepped and draped in the usual sterile fashion. The correct patient, procedure, and site was  verified.   Injection Procedure Details:  Procedure Site One Meds Administered:  Meds ordered this encounter  Medications  . methylPREDNISolone acetate (DEPO-MEDROL) injection 80 mg    Laterality: Left  Location/Site:  S1 Foramen   Needle size: 22 ga.  Needle type: Spinal  Needle Placement: Transforaminal  Findings:   -Comments: Excellent flow of contrast along the nerve and into the epidural space.  Procedure Details: After squaring off the sacral end-plate to get a true AP view, the C-arm was positioned so that the best possible view of the S1 foramen was visualized. The soft tissues overlying this structure were infiltrated with 2-3 ml. of 1% Lidocaine without Epinephrine.    The spinal needle was inserted toward the target using a "trajectory" view along the fluoroscope beam.  Under AP and lateral visualization, the needle was advanced so it did not puncture dura. Biplanar projections were used to confirm position. Aspiration was confirmed to be negative for CSF and/or blood. A 1-2 ml. volume of Isovue-250 was injected and flow of contrast was noted at each level. Radiographs were obtained for documentation purposes.   After attaining the desired flow of contrast documented above, a 0.5 to 1.0 ml test dose of 0.25% Marcaine was injected into each respective transforaminal space.  The patient was observed for 90 seconds post injection.  After no sensory deficits were reported, and normal lower extremity motor function was noted,   the above injectate was administered so that equal amounts of the injectate  were placed at each foramen (level) into the transforaminal epidural space.   Additional Comments:  The patient tolerated the procedure well Dressing: Band-Aid    Post-procedure details: Patient was observed during the procedure. Post-procedure instructions were reviewed.  Patient left the clinic in stable condition.   Clinical History: CLINICAL DATA:  Low back pain  extending down the left leg for 3 weeks.  EXAM: MRI LUMBAR SPINE WITHOUT CONTRAST  TECHNIQUE: Multiplanar, multisequence MR imaging of the lumbar spine was performed. No intravenous contrast was administered.  COMPARISON:  Abdomen radiograph from 12/05/2011 and CT abdomen from 11/05/2011  FINDINGS: Segmentation: The lowest lumbar type non-rib-bearing vertebra is labeled as L5.  Alignment:  3 mm degenerative retrolisthesis at L5-S1.  Vertebrae: Small scattered hemangiomas in the lumbar spine and sacrum compatible with mild hemangiomatosis.  Disc desiccation and mild loss of disc height at L5-S1.  Conus medullaris and cauda equina: Conus extends to the L1 level. Conus and cauda equina appear normal.  Paraspinal and other soft tissues: Unremarkable  Disc levels:  L1-2: Unremarkable.  L2-3: Unremarkable.  L3-4: Unremarkable.  L4-5: No overt impingement. Disc bulge with small central disc protrusion. Synovial cyst posterior to the facet joint not in a position to cause impingement.  L5-S1: Moderate to severe left subarticular lateral recess stenosis with moderate left eccentric central narrowing of the thecal sac, and moderate right subarticular lateral recess stenosis along with mild left foraminal stenosis due to a left lateral recess disc extrusion or disc fragment extending cephalad along with a central disc protrusion, disc bulge, and facet arthropathy.  IMPRESSION: 1. Moderate to prominent impingement at L5-S1 due to a disc extrusion or disc fragment in the left lateral recess, central disc protrusion, degenerative subluxation, and facet arthropathy as detailed above. 2. Degenerative disc disease at L4-5 does not cause significant impingement.   Electronically Signed   By: Van Clines M.D.   On: 11/30/2017 13:29   She reports that she has been smoking.  She has been smoking about 0.50 packs per [place]. She has never used smokeless  tobacco. No results for input(s): HGBA1C, LABURIC in the last 8760 hours.  Objective:  VS:  HT:    WT:   BMI:     BP:132/72  HR:91bpm  TEMP:98.6 F (37 C)(Oral)  RESP:98 % Physical Exam  Ortho Exam Imaging: No results found.  Past Medical/Family/Surgical/Social History: Medications & Allergies reviewed per EMR, new medications updated. Patient Active Problem List   Diagnosis Date Noted  . Obstructive sleep apnea treated with continuous positive airway pressure (CPAP) 12/08/2017  . Lumbar back pain with radiculopathy affecting left lower extremity 11/24/2017  . History of gestational diabetes mellitus 11/18/2017  . OCD (obsessive compulsive disorder)   . Hx of migraines   . Arthritis   . Sleep apnea 04/26/2017  . Vitamin D deficiency 10/24/2013  . History of kidney stones 10/25/2011  . Vitiligo   . Anxiety   . Tobacco abuse 08/27/1987   Past Medical History:  Diagnosis Date  . Anxiety    currently managed on CBD oil, was on medication briefly but made her OCD worse  . Arthritis    hands  . Breast mass, right 09/2012   removed because it was painful, was found to be calcified scar tissue  . Cervical dysplasia age 4  . GDM (gestational diabetes mellitus)    with last pregnancy  . Geographical tongue   . 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  History of kidney stones 10/2011  . 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 Hx of migraines   . OCD (obsessive compulsive disorder)    cleanliness with home and workspace  . Rosacea   . Sinus congestion 10/12/2012  . Sleep apnea 04/2017   patient is wearing C-pap  . Tobacco abuse 1989   max was 1.5 packs per [place]  . Vitamin D deficiency 10/2013   Value 29  . Vitiligo    Family History  Problem Relation Age of Onset  . Arthritis Mother   . High Cholesterol Mother   . High blood pressure Mother   . Hearing loss Father   . High Cholesterol Father   . High  blood pressure Father   . Cancer Maternal Grandfather        lymphoma  . Cancer Paternal Grandfather        liver  . Alcohol abuse Paternal Grandfather   . Lung cancer Maternal Grandmother        and maternal uncle; small cell   . Arthritis Maternal Grandmother   . Hyperlipidemia Maternal Grandmother   . Hypertension Maternal Grandmother   . Hearing loss Maternal Grandmother   . Cancer Maternal Uncle        Lung cancer  . Hyperlipidemia Other   . Asthma Brother   . Alcohol abuse Paternal Grandmother   . Hypertension Paternal Grandmother   . Colon cancer Neg Hx   . Breast cancer Neg Hx    Past Surgical History:  Procedure Laterality Date  . ABDOMINOPLASTY  2008  . BREAST BIOPSY Right 10/15/2012   Procedure: BREAST BIOPSY;  Surgeon: Haywood Lasso, MD;  Location: [facility];  Service: General;  Laterality: Right;  removal right breast mass  . BREAST SURGERY     Lift  . CESAREAN SECTION  2004, 2007  . CESAREAN SECTION N/A 02/07/2015   Procedure: CESAREAN SECTION;  Surgeon: Jerelyn Charles, MD;  Location: [facility];  Service: Obstetrics;  Laterality: N/A;  EDD: 02/13/15   . dialation and cutterage    . GYNECOLOGIC CRYOSURGERY  age 77  . INTRAUTERINE DEVICE INSERTION  03/2015   Mirena at [facility]  . TONSILLECTOMY  2000   Social History   Occupational History  . Not on file  Tobacco Use  . Smoking status: Current Every [place] Smoker    Packs/[place]: 0.50    Last attempt to quit: 08/17/2012    Years since quitting: 5.3  . Smokeless tobacco: Never Used  . Tobacco comment: restarted 10/17 d/t husband losing job  Substance and Sexual Activity  . Alcohol use: Yes    Comment: rare, twice a year  . Drug use: No  . Sexual activity: Yes    Partners: Male    Birth control/protection: IUD    Comment: Mirena inserted 03-2015-1st intercourse 47 yo-More than 5 partners

## 2017-12-17 NOTE — Progress Notes (Signed)
I acted as a Education administrator for [employer], PA-C Anselmo Pickler, LPN   Subjective:    Mackenzie Meyers is a 47 y.o. female and is here for a comprehensive physical exam.   HPI  Health Maintenance Due  Topic Date Due  . MAMMOGRAM  08/07/2016   Acute Concerns: Fatigue -- she has OSA and is wondering if she has narcolepsy, she is planning to go to see her neurologist (who manages her OSA) to discuss this.  Dysthymia -- has weekly episodes of feeling down and sad; unable to clean her house and keep organized due to back pain and this triggers her OCD. She has had weight gain due to back pain. Allergies -- having PND, using Nasocort Mass on R shoulder -- she has noticed a small lump on her R shoulder a few months ago. Non tender. Hasn't changed in size. Denies fevers or unintentional weight loss.  Chronic Issues: OSA -- uses CPAP every night, she is wondering if she has narcolepsy because she is still sleeping Degeneration of lumbar disc -- plan is to work on pain management to avoid surgery. Had her first injection a few weeks ago, had 40-50% relief. Is wanting to have a second one.  Vit D def -- most recent Vit D on 10/05/16 was 20.9, takes daily supplement, will re-check today Hx of gestational diabetes -- has had some slight weight gain with her back pain, no polyphagia/polyuria Tobacco abuse -- patient has been smoking since age 46. Trying to cut back. She has tried chantix and patches in the past without success.  Health Maintenance: Immunizations -- up to date Colonoscopy -- N/A Mammogram -- scheduled for 12/24/2017 PAP -- done 03/18/2016 normal  Bone Density -- N/A Diet -- variable, working on healthier options Caffeine intake -- Diet Dr. Malachi Bonds Sleep habits -- uses CPAP (see above) Exercise -- minimal 2/2 back pain, does go walking Weight -- Weight: 196 lb (88.9 kg)  Mood -- variable, see above  Depression screen [facility] 2/9 12/18/2017  Decreased Interest 0  Down, Depressed,  Hopeless 1  PHQ - 2 Score 1  Altered sleeping -  Tired, decreased energy -  Change in appetite -  Feeling bad or failure about yourself  -  Trouble concentrating -  Moving slowly or fidgety/restless -  Suicidal thoughts -  PHQ-9 Score -   Other providers/specialists: Dr. Phineas Real -- ob-gyn Dr. Ernestina Patches -- pain   PMHx, SurgHx, SocialHx, Medications, and Allergies were reviewed in the Visit Navigator and updated as appropriate.   Past Medical History:  Diagnosis Date  . Anxiety    currently managed on CBD oil, was on medication briefly but made her OCD worse  . Arthritis    hands  . Breast mass, right 09/2012   removed because it was painful, was found to be calcified scar tissue  . Cervical dysplasia age 54  . GDM (gestational diabetes mellitus)    with last pregnancy  . Geographical tongue   . 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 History of kidney stones 10/2011  . 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 Hx of migraines   . OCD (obsessive compulsive disorder)    cleanliness with home and workspace  . Rosacea   . Sinus congestion 10/12/2012  . Sleep apnea 04/2017   patient is wearing C-pap  . Tobacco abuse 1989   max was 1.5 packs per [place]  .  Vitamin D deficiency 10/2013   Value 29  . Vitiligo      Past Surgical History:  Procedure Laterality Date  . ABDOMINOPLASTY  2008  . BREAST BIOPSY Right 10/15/2012   Procedure: BREAST BIOPSY;  Surgeon: Haywood Lasso, MD;  Location: [facility];  Service: General;  Laterality: Right;  removal right breast mass  . BREAST SURGERY     Lift  . CESAREAN SECTION  2004, 2007  . CESAREAN SECTION N/A 02/07/2015   Procedure: CESAREAN SECTION;  Surgeon: Jerelyn Charles, MD;  Location: [facility];  Service: Obstetrics;  Laterality: N/A;  EDD: 02/13/15   . dialation and cutterage    . GYNECOLOGIC CRYOSURGERY  age 55  . INTRAUTERINE DEVICE INSERTION  03/2015    Mirena at [place]  . TONSILLECTOMY  2000     Family History  Problem Relation Age of Onset  . Arthritis Mother   . High Cholesterol Mother   . High blood pressure Mother   . Hearing loss Father   . High Cholesterol Father   . High blood pressure Father   . Cancer Maternal Grandfather        lymphoma  . Cancer Paternal Grandfather        liver  . Alcohol abuse Paternal Grandfather   . Lung cancer Maternal Grandmother        and maternal uncle; small cell   . Arthritis Maternal Grandmother   . Hyperlipidemia Maternal Grandmother   . Hypertension Maternal Grandmother   . Hearing loss Maternal Grandmother   . Cancer Maternal Uncle        Lung cancer  . Hyperlipidemia Other   . Asthma Brother   . Alcohol abuse Paternal Grandmother   . Hypertension Paternal Grandmother   . Colon cancer Neg Hx   . Breast cancer Neg Hx     Social History   Tobacco Use  . Smoking status: Current Every [place] Smoker    Packs/[place]: 0.50    Last attempt to quit: 08/17/2012    Years since quitting: 5.3  . Smokeless tobacco: Never Used  . Tobacco comment: restarted 10/17 d/t husband losing job  Substance Use Topics  . Alcohol use: Yes    Comment: rare, twice a year  . Drug use: No    Review of Systems:   Review of Systems  Constitutional: Positive for malaise/fatigue. Negative for chills, fever and weight loss.  HENT: Positive for congestion. Negative for hearing loss, sinus pain and sore throat.   Respiratory: Negative for cough and hemoptysis.   Cardiovascular: Negative for chest pain, palpitations, leg swelling and PND.  Gastrointestinal: Negative for abdominal pain, constipation, diarrhea, heartburn, nausea and vomiting.  Genitourinary: Negative for dysuria, frequency and urgency.  Musculoskeletal: Positive for back pain. Negative for myalgias and neck pain.  Skin: Negative for itching and rash.  Neurological: Negative for dizziness, tingling, seizures and headaches.    Endo/Heme/Allergies: Negative for polydipsia.  Psychiatric/Behavioral: Positive for depression. The patient is not nervous/anxious.     Objective:   BP 110/70 (BP Location: Left Arm, Patient Position: Sitting, Cuff Size: Large)   Pulse 78   Temp 98.3 F (36.8 C) (Oral)   Ht 5' 4.5" (1.638 m)   Wt 196 lb (88.9 kg)   SpO2 96%   BMI 33.12 kg/m  Body mass index is 33.12 kg/m.   General Appearance:    Alert, cooperative, no distress, appears stated age  Head:    Normocephalic, without obvious abnormality, atraumatic  Eyes:    PERRL, conjunctiva/corneas clear, EOM's intact, fundi    benign, both eyes  Ears:    Normal TM's and external ear canals, both ears  Nose:   Nares normal, septum midline, mucosa normal, no drainage    or sinus tenderness  Throat:   Lips, mucosa, and tongue normal; teeth and gums normal  Neck:   Supple, symmetrical, trachea midline, no adenopathy;    thyroid:  no enlargement/tenderness/nodules; no carotid   bruit or JVD  Back:     Symmetric, no curvature, ROM normal, no CVA tenderness  Lungs:     Clear to auscultation bilaterally, respirations unlabored  Chest Wall:    No tenderness or deformity   Heart:    Regular rate and rhythm, S1 and S2 normal, no murmur, rub   or gallop  Breast Exam:    Deferred  Abdomen:     Soft, non-tender, bowel sounds active all four quadrants,    no masses, no organomegaly  Genitalia:    Deferred  Extremities:   Extremities normal, atraumatic, no cyanosis or edema; multiple very small fixed nodules at superior region of middle right shoulder near bra-strap region; nontender  Pulses:   2+ and symmetric all extremities  Skin:   Skin texture, turgor normal, no rashes or lesions; multiple large areas of scattered non-pigmented sections  Lymph nodes:   Cervical, supraclavicular, and axillary nodes normal  Neurologic:   CNII-XII intact, normal strength, sensation and reflexes    throughout    Assessment/Plan:   Sheryl was seen  today for annual exam.  Diagnoses and all orders for this visit:  Encounter for general adult medical examination with abnormal findings Today patient counseled on age appropriate routine health concerns for screening and prevention, each reviewed and up to date or declined. Immunizations reviewed and up to date or declined. Labs ordered and reviewed. Risk factors for depression reviewed and negative. Hearing function and visual acuity are intact. ADLs screened and addressed as needed. Functional ability and level of safety reviewed and appropriate. Education, counseling and referrals performed based on assessed risks today. Patient provided with a copy of personalized plan for preventive services.  Encounter for lipid screening for cardiovascular disease -     Cancel: Lipid panel -     Lipid panel; Future  Fatigue, unspecified type Multifactorial. Will check labs to r/o organic cause. Work on diet, weight, sleep issues as we discussed. She is going to f/u in 2 months. -     Cancel: CBC with Differential/Platelet -     Cancel: Comprehensive metabolic panel -     Cancel: TSH -     Cancel: Vitamin B12 -     CBC with Differential/Platelet; Future -     TSH; Future -     Comprehensive metabolic panel; Future -     Vitamin B12; Future  Vitamin D deficiency Re-check. -     Cancel: VITAMIN D 25 Hydroxy (Vit-D Deficiency, Fractures) -     VITAMIN D 25 Hydroxy (Vit-D Deficiency, Fractures); Future  History of gestational diabetes mellitus Continue to work on diet and exercise. Limit sugary beverages. Will check HgbA1c. -     Cancel: Hemoglobin A1c -     Hemoglobin A1c; Future  Mass of shoulder region Will obtain u/s for further evaluation.  Dysthymia Denies SI/HI. I discussed with patient that if they develop any SI, to tell someone immediately and seek medical attention. Continue to work on Child psychotherapist. Suspect symptoms will improve as  back pain improves and she is able to resume  her normal ADLs around her house.  Obesity, unspecified classification, unspecified obesity type, unspecified whether serious comorbidity present She is working on dietary changes. Follow-up in 2 months. -     Hemoglobin A1c; Future  Obstructive sleep apnea treated with continuous positive airway pressure (CPAP) Compliant with CPAP. She is interested in eval for narcolepsy -- plans to reach out to her neurologist so she can discuss this.  Tobacco abuse Continue to counsel, she is not ready to quit.  Lumbar back pain with radiculopathy affecting left lower extremity Management per Dr. Paulla Fore and Dr. Ernestina Patches.    Well Adult Exam: Labs ordered: Yes. Patient counseling was done. See below for items discussed. Discussed the patient's BMI.  The BMI BMI is not in the acceptable range; BMI management plan is completed Follow up in 2 months. Breast cancer screening: scheduled. Cervical cancer screening: UTD    Patient Counseling: [x]    Nutrition: Stressed importance of moderation in sodium/caffeine intake, saturated fat and cholesterol, caloric balance, sufficient intake of fresh fruits, vegetables, fiber, calcium, iron, and 1 mg of folate supplement per [place] (for females capable of pregnancy).  [x]    Stressed the importance of regular exercise.   [x]    Substance Abuse: Discussed cessation/primary prevention of tobacco, alcohol, or other drug use; driving or other dangerous activities under the influence; availability of treatment for abuse.   [x]    Injury prevention: Discussed safety belts, safety helmets, smoke detector, smoking near bedding or upholstery.   [x]    Sexuality: Discussed sexually transmitted diseases, partner selection, use of condoms, avoidance of unintended pregnancy  and contraceptive alternatives.  [x]    Dental health: Discussed importance of regular tooth brushing, flossing, and dental visits.  [x]    Health maintenance and immunizations reviewed. Please refer to Health maintenance  section.   CMA or LPN served as scribe during this visit. History, Physical, and Plan performed by medical provider. Documentation and orders reviewed and attested to.  Inda Coke, PA-C [place]

## 2017-12-18 ENCOUNTER — Telehealth: Payer: Self-pay | Admitting: Nurse Practitioner

## 2017-12-18 ENCOUNTER — Ambulatory Visit (INDEPENDENT_AMBULATORY_CARE_PROVIDER_SITE_OTHER): Payer: PRIVATE HEALTH INSURANCE | Admitting: Physician Assistant

## 2017-12-18 ENCOUNTER — Encounter: Payer: Self-pay | Admitting: Physician Assistant

## 2017-12-18 VITALS — BP 110/70 | HR 78 | Temp 98.3°F | Ht 64.5 in | Wt 196.0 lb

## 2017-12-18 DIAGNOSIS — R5383 Other fatigue: Secondary | ICD-10-CM

## 2017-12-18 DIAGNOSIS — Z0001 Encounter for general adult medical examination with abnormal findings: Secondary | ICD-10-CM | POA: Diagnosis not present

## 2017-12-18 DIAGNOSIS — Z13220 Encounter for screening for lipoid disorders: Secondary | ICD-10-CM

## 2017-12-18 DIAGNOSIS — M5416 Radiculopathy, lumbar region: Secondary | ICD-10-CM

## 2017-12-18 DIAGNOSIS — Z9989 Dependence on other enabling machines and devices: Secondary | ICD-10-CM | POA: Diagnosis not present

## 2017-12-18 DIAGNOSIS — G4733 Obstructive sleep apnea (adult) (pediatric): Secondary | ICD-10-CM | POA: Diagnosis not present

## 2017-12-18 DIAGNOSIS — Z8632 Personal history of gestational diabetes: Secondary | ICD-10-CM

## 2017-12-18 DIAGNOSIS — E559 Vitamin D deficiency, unspecified: Secondary | ICD-10-CM

## 2017-12-18 DIAGNOSIS — Z720 Tobacco use: Secondary | ICD-10-CM

## 2017-12-18 DIAGNOSIS — F341 Dysthymic disorder: Secondary | ICD-10-CM

## 2017-12-18 DIAGNOSIS — Z136 Encounter for screening for cardiovascular disorders: Secondary | ICD-10-CM | POA: Diagnosis not present

## 2017-12-18 DIAGNOSIS — R2230 Localized swelling, mass and lump, unspecified upper limb: Secondary | ICD-10-CM | POA: Diagnosis not present

## 2017-12-18 DIAGNOSIS — E669 Obesity, unspecified: Secondary | ICD-10-CM

## 2017-12-18 NOTE — Telephone Encounter (Signed)
Left message for patient to call back to schedule.  °

## 2017-12-18 NOTE — Telephone Encounter (Signed)
Pt called stating he PCP would like her to have testing done to check for narcolepsy. Pt requesting a call back to discuss the options.

## 2017-12-18 NOTE — Telephone Encounter (Signed)
LMVM for pt to return call.   

## 2017-12-18 NOTE — Patient Instructions (Addendum)
Call Neurology to discuss possible eval for narcolepsy.  Start taking a daily antihistamine.  You will be contacted about your lab results and ultrasound.  Let's follow-up in 2 months to see how your weight and mood is doing -- sooner if you have more pressing concerns.   Health Maintenance, Female Adopting a healthy lifestyle and getting preventive care can go a long way to promote health and wellness. Talk with your health care provider about what schedule of regular examinations is right for you. This is a good chance for you to check in with your provider about disease prevention and staying healthy. In between checkups, there are plenty of things you can do on your own. Experts have done a lot of research about which lifestyle changes and preventive measures are most likely to keep you healthy. Ask your health care provider for more information. Weight and diet Eat a healthy diet  Be sure to include plenty of vegetables, fruits, low-fat dairy products, and lean protein.  Do not eat a lot of foods high in solid fats, added sugars, or salt.  Get regular exercise. This is one of the most important things you can do for your health. ? Most adults should exercise for at least 150 minutes each week. The exercise should increase your heart rate and make you sweat (moderate-intensity exercise). ? Most adults should also do strengthening exercises at least twice a week. This is in addition to the moderate-intensity exercise.  Maintain a healthy weight  Body mass index (BMI) is a measurement that can be used to identify possible weight problems. It estimates body fat based on height and weight. Your health care provider can help determine your BMI and help you achieve or maintain a healthy weight.  For females 87 years of age and older: ? A BMI below 18.5 is considered underweight. ? A BMI of 18.5 to 24.9 is normal. ? A BMI of 25 to 29.9 is considered overweight. ? A BMI of 30 and above is  considered obese.  Watch levels of cholesterol and blood lipids  You should start having your blood tested for lipids and cholesterol at 45 years of age, then have this test every 5 years.  You may need to have your cholesterol levels checked more often if: ? Your lipid or cholesterol levels are high. ? You are older than 45 years of age. ? You are at high risk for heart disease.  Cancer screening Lung Cancer  Lung cancer screening is recommended for adults 61-90 years old who are at high risk for lung cancer because of a history of smoking.  A yearly low-dose CT scan of the lungs is recommended for people who: ? Currently smoke. ? Have quit within the past 15 years. ? Have at least a 30-pack-year history of smoking. A pack year is smoking an average of one pack of cigarettes a [place] for 1 year.  Yearly screening should continue until it has been 15 years since you quit.  Yearly screening should stop if you develop a health problem that would prevent you from having lung cancer treatment.  Breast Cancer  Practice breast self-awareness. This means understanding how your breasts normally appear and feel.  It also means doing regular breast self-exams. Let your health care provider know about any changes, no matter how small.  If you are in your 20s or 30s, you should have a clinical breast exam (CBE) by a health care provider every 1-3 years as part of a  regular health exam.  If you are 40 or older, have a CBE every year. Also consider having a breast X-ray (mammogram) every year.  If you have a family history of breast cancer, talk to your health care provider about genetic screening.  If you are at high risk for breast cancer, talk to your health care provider about having an MRI and a mammogram every year.  Breast cancer gene (BRCA) assessment is recommended for women who have family members with BRCA-related cancers. BRCA-related cancers  include: ? Breast. ? Ovarian. ? Tubal. ? Peritoneal cancers.  Results of the assessment will determine the need for genetic counseling and BRCA1 and BRCA2 testing.  Cervical Cancer Your health care provider may recommend that you be screened regularly for cancer of the pelvic organs (ovaries, uterus, and vagina). This screening involves a pelvic examination, including checking for microscopic changes to the surface of your cervix (Pap test). You may be encouraged to have this screening done every 3 years, beginning at age 74.  For women ages 34-65, health care providers may recommend pelvic exams and Pap testing every 3 years, or they may recommend the Pap and pelvic exam, combined with testing for human papilloma virus (HPV), every 5 years. Some types of HPV increase your risk of cervical cancer. Testing for HPV may also be done on women of any age with unclear Pap test results.  Other health care providers may not recommend any screening for nonpregnant women who are considered low risk for pelvic cancer and who do not have symptoms. Ask your health care provider if a screening pelvic exam is right for you.  If you have had past treatment for cervical cancer or a condition that could lead to cancer, you need Pap tests and screening for cancer for at least 20 years after your treatment. If Pap tests have been discontinued, your risk factors (such as having a new sexual partner) need to be reassessed to determine if screening should resume. Some women have medical problems that increase the chance of getting cervical cancer. In these cases, your health care provider may recommend more frequent screening and Pap tests.  Colorectal Cancer  This type of cancer can be detected and often prevented.  Routine colorectal cancer screening usually begins at 45 years of age and continues through 45 years of age.  Your health care provider may recommend screening at an earlier age if you have risk factors  for colon cancer.  Your health care provider may also recommend using home test kits to check for hidden blood in the stool.  A small camera at the end of a tube can be used to examine your colon directly (sigmoidoscopy or colonoscopy). This is done to check for the earliest forms of colorectal cancer.  Routine screening usually begins at age 70.  Direct examination of the colon should be repeated every 5-10 years through 45 years of age. However, you may need to be screened more often if early forms of precancerous polyps or small growths are found.  Skin Cancer  Check your skin from head to toe regularly.  Tell your health care provider about any new moles or changes in moles, especially if there is a change in a mole's shape or color.  Also tell your health care provider if you have a mole that is larger than the size of a pencil eraser.  Always use sunscreen. Apply sunscreen liberally and repeatedly throughout the [place].  Protect yourself by wearing long sleeves, pants,  a wide-brimmed hat, and sunglasses whenever you are outside.  Heart disease, diabetes, and high blood pressure  High blood pressure causes heart disease and increases the risk of stroke. High blood pressure is more likely to develop in: ? People who have blood pressure in the high end of the normal range (130-139/85-89 mm Hg). ? People who are overweight or obese. ? People who are African American.  If you are 82-70 years of age, have your blood pressure checked every 3-5 years. If you are 56 years of age or older, have your blood pressure checked every year. You should have your blood pressure measured twice-once when you are at a hospital or clinic, and once when you are not at a hospital or clinic. Record the average of the two measurements. To check your blood pressure when you are not at a hospital or clinic, you can use: ? An automated blood pressure machine at a pharmacy. ? A home blood pressure monitor.  If  you are between 62 years and 4 years old, ask your health care provider if you should take aspirin to prevent strokes.  Have regular diabetes screenings. This involves taking a blood sample to check your fasting blood sugar level. ? If you are at a normal weight and have a low risk for diabetes, have this test once every three years after 45 years of age. ? If you are overweight and have a high risk for diabetes, consider being tested at a younger age or more often. Preventing infection Hepatitis B  If you have a higher risk for hepatitis B, you should be screened for this virus. You are considered at high risk for hepatitis B if: ? You were born in a country where hepatitis B is common. Ask your health care provider which countries are considered high risk. ? Your parents were born in a high-risk country, and you have not been immunized against hepatitis B (hepatitis B vaccine). ? You have HIV or AIDS. ? You use needles to inject street drugs. ? You live with someone who has hepatitis B. ? You have had sex with someone who has hepatitis B. ? You get hemodialysis treatment. ? You take certain medicines for conditions, including cancer, organ transplantation, and autoimmune conditions.  Hepatitis C  Blood testing is recommended for: ? Everyone born from [birth year] through 1963. ? Anyone with known risk factors for hepatitis C.  Sexually transmitted infections (STIs)  You should be screened for sexually transmitted infections (STIs) including gonorrhea and chlamydia if: ? You are sexually active and are younger than 45 years of age. ? You are older than 45 years of age and your health care provider tells you that you are at risk for this type of infection. ? Your sexual activity has changed since you were last screened and you are at an increased risk for chlamydia or gonorrhea. Ask your health care provider if you are at risk.  If you do not have HIV, but are at risk, it may be recommended  that you take a prescription medicine daily to prevent HIV infection. This is called pre-exposure prophylaxis (PrEP). You are considered at risk if: ? You are sexually active and do not regularly use condoms or know the HIV status of your partner(s). ? You take drugs by injection. ? You are sexually active with a partner who has HIV.  Talk with your health care provider about whether you are at high risk of being infected with HIV. If you choose  to begin PrEP, you should first be tested for HIV. You should then be tested every 3 months for as long as you are taking PrEP. Pregnancy  If you are premenopausal and you may become pregnant, ask your health care provider about preconception counseling.  If you may become pregnant, take 400 to 800 micrograms (mcg) of folic acid every [place].  If you want to prevent pregnancy, talk to your health care provider about birth control (contraception). Osteoporosis and menopause  Osteoporosis is a disease in which the bones lose minerals and strength with aging. This can result in serious bone fractures. Your risk for osteoporosis can be identified using a bone density scan.  If you are 26 years of age or older, or if you are at risk for osteoporosis and fractures, ask your health care provider if you should be screened.  Ask your health care provider whether you should take a calcium or vitamin D supplement to lower your risk for osteoporosis.  Menopause may have certain physical symptoms and risks.  Hormone replacement therapy may reduce some of these symptoms and risks. Talk to your health care provider about whether hormone replacement therapy is right for you. Follow these instructions at home:  Schedule regular health, dental, and eye exams.  Stay current with your immunizations.  Do not use any tobacco products including cigarettes, chewing tobacco, or electronic cigarettes.  If you are pregnant, do not drink alcohol.  If you are  breastfeeding, limit how much and how often you drink alcohol.  Limit alcohol intake to no more than 1 drink per [place] for nonpregnant women. One drink equals 12 ounces of beer, 5 ounces of wine, or 1 ounces of hard liquor.  Do not use street drugs.  Do not share needles.  Ask your health care provider for help if you need support or information about quitting drugs.  Tell your health care provider if you often feel depressed.  Tell your health care provider if you have ever been abused or do not feel safe at home. This information is not intended to replace advice given to you by your health care provider. Make sure you discuss any questions you have with your health care provider. Document Released: 02/25/2011 Document Revised: 01/18/2016 Document Reviewed: 05/16/2015 Elsevier Interactive Patient Education  [employer].

## 2017-12-18 NOTE — Telephone Encounter (Signed)
Scheduled for 12/25/17 at 1330 with driver.

## 2017-12-19 ENCOUNTER — Other Ambulatory Visit (INDEPENDENT_AMBULATORY_CARE_PROVIDER_SITE_OTHER): Payer: PRIVATE HEALTH INSURANCE

## 2017-12-19 ENCOUNTER — Encounter: Payer: Self-pay | Admitting: Physician Assistant

## 2017-12-19 DIAGNOSIS — E559 Vitamin D deficiency, unspecified: Secondary | ICD-10-CM | POA: Diagnosis not present

## 2017-12-19 DIAGNOSIS — R5383 Other fatigue: Secondary | ICD-10-CM | POA: Diagnosis not present

## 2017-12-19 DIAGNOSIS — Z8632 Personal history of gestational diabetes: Secondary | ICD-10-CM | POA: Diagnosis not present

## 2017-12-19 DIAGNOSIS — E669 Obesity, unspecified: Secondary | ICD-10-CM

## 2017-12-19 DIAGNOSIS — Z13220 Encounter for screening for lipoid disorders: Secondary | ICD-10-CM | POA: Diagnosis not present

## 2017-12-19 DIAGNOSIS — Z136 Encounter for screening for cardiovascular disorders: Secondary | ICD-10-CM | POA: Diagnosis not present

## 2017-12-19 LAB — LIPID PANEL
Cholesterol: 184 mg/dL (ref 0–200)
HDL: 55 mg/dL (ref >39.00)
LDL Cholesterol: 118 mg/dL — ABNORMAL HIGH (ref 0–99)
NonHDL: 129.19
Total CHOL/HDL Ratio: 3
Triglycerides: 58 mg/dL (ref 0.0–149.0)
VLDL: 11.6 mg/dL (ref 0.0–40.0)

## 2017-12-19 LAB — COMPREHENSIVE METABOLIC PANEL
ALT: 41 U/L — ABNORMAL HIGH (ref 0–35)
AST: 21 U/L (ref 0–37)
Albumin: 4.3 g/dL (ref 3.5–5.2)
Alkaline Phosphatase: 67 U/L (ref 39–117)
BUN: 17 mg/dL (ref 6–23)
CO2: 30 mEq/L (ref 19–32)
Calcium: 9.6 mg/dL (ref 8.4–10.5)
Chloride: 103 mEq/L (ref 96–112)
Creatinine, Ser: 0.72 mg/dL (ref 0.40–1.20)
GFR: 93.31 mL/min (ref >60.00)
Glucose, Bld: 85 mg/dL (ref 70–99)
Potassium: 4.5 mEq/L (ref 3.5–5.1)
Sodium: 140 mEq/L (ref 135–145)
Total Bilirubin: 0.5 mg/dL (ref 0.2–1.2)
Total Protein: 7.1 g/dL (ref 6.0–8.3)

## 2017-12-19 LAB — HEMOGLOBIN A1C: Hgb A1c MFr Bld: 5.7 % (ref 4.6–6.5)

## 2017-12-19 LAB — CBC WITH DIFFERENTIAL/PLATELET
Basophils Absolute: 0.1 10*3/uL (ref 0.0–0.1)
Basophils Relative: 0.8 % (ref 0.0–3.0)
Eosinophils Absolute: 0.2 10*3/uL (ref 0.0–0.7)
Eosinophils Relative: 2.1 % (ref 0.0–5.0)
HCT: 44.5 % (ref 36.0–46.0)
Hemoglobin: 14.9 g/dL (ref 12.0–15.0)
Lymphocytes Relative: 30.2 % (ref 12.0–46.0)
Lymphs Abs: 2.5 10*3/uL (ref 0.7–4.0)
MCHC: 33.4 g/dL (ref 30.0–36.0)
MCV: 88.3 fl (ref 78.0–100.0)
Monocytes Absolute: 0.7 10*3/uL (ref 0.1–1.0)
Monocytes Relative: 8 % (ref 3.0–12.0)
Neutro Abs: 4.8 10*3/uL (ref 1.4–7.7)
Neutrophils Relative %: 58.9 % (ref 43.0–77.0)
Platelets: 290 10*3/uL (ref 150.0–400.0)
RBC: 5.04 Mil/uL (ref 3.87–5.11)
RDW: 14.8 % (ref 11.5–15.5)
WBC: 8.2 10*3/uL (ref 4.0–10.5)

## 2017-12-19 LAB — VITAMIN B12: Vitamin B-12: 230 pg/mL (ref 211–911)

## 2017-12-19 LAB — VITAMIN D 25 HYDROXY (VIT D DEFICIENCY, FRACTURES): VITD: 32.16 ng/mL (ref 30.00–100.00)

## 2017-12-19 LAB — TSH: TSH: 1.46 u[IU]/mL (ref 0.35–4.50)

## 2017-12-22 NOTE — Telephone Encounter (Signed)
I have called the patient and offered her a apt on Thursday to discuss testing for narcolepsy. Pt will check in at 9 am for a 9:30 apt.

## 2017-12-22 NOTE — Telephone Encounter (Signed)
Spoke to pt.  She was just seen 12/12/17 with CM/NP. She states that she did not fill out the ESS as truthfully as she should have.  Even with Cpap night time sleep is better. She continues with excessive daytime sleeping.  She has to take a nap daily 2-3 hours or she is very fatigued, exhausted. (if she doesn't she will hit a wall (and have to go to bed much earlier then normal).  She does not feel like she has cataplexy.  She would like to have additional testing for narcolepsy.

## 2017-12-22 NOTE — Telephone Encounter (Signed)
Testing for narcolepsy is different. I would think if PCP wants her tested there should be documentation but anyway it is a different . Please ask Myriam Jacobson and [place]

## 2017-12-22 NOTE — Telephone Encounter (Signed)
12/18/17 epic note from PCP about testing for narcolepsy.  (can get referral if needed).

## 2017-12-23 ENCOUNTER — Encounter: Payer: Self-pay | Admitting: Neurology

## 2017-12-23 ENCOUNTER — Emergency Department (HOSPITAL_COMMUNITY)
Admission: EM | Admit: 2017-12-23 | Discharge: 2017-12-23 | Disposition: A | Discharge status: Home / Self Care | Payer: PRIVATE HEALTH INSURANCE | Attending: Emergency Medicine | Admitting: Emergency Medicine

## 2017-12-23 ENCOUNTER — Ambulatory Visit: Payer: Self-pay | Admitting: *Deleted

## 2017-12-23 ENCOUNTER — Emergency Department (HOSPITAL_COMMUNITY): Payer: PRIVATE HEALTH INSURANCE

## 2017-12-23 ENCOUNTER — Other Ambulatory Visit: Payer: Self-pay

## 2017-12-23 ENCOUNTER — Encounter: Payer: Self-pay | Admitting: *Deleted

## 2017-12-23 ENCOUNTER — Encounter (HOSPITAL_COMMUNITY): Payer: Self-pay | Admitting: Emergency Medicine

## 2017-12-23 DIAGNOSIS — M5440 Lumbago with sciatica, unspecified side: Secondary | ICD-10-CM

## 2017-12-23 DIAGNOSIS — M545 Low back pain: Secondary | ICD-10-CM | POA: Diagnosis present

## 2017-12-23 DIAGNOSIS — F17210 Nicotine dependence, cigarettes, uncomplicated: Secondary | ICD-10-CM | POA: Diagnosis not present

## 2017-12-23 LAB — URINALYSIS, ROUTINE W REFLEX MICROSCOPIC
Bilirubin Urine: NEGATIVE
Glucose, UA: NEGATIVE mg/dL
Hgb urine dipstick: NEGATIVE
Ketones, ur: NEGATIVE mg/dL
Leukocytes, UA: NEGATIVE
Nitrite: NEGATIVE
Protein, ur: NEGATIVE mg/dL
Specific Gravity, Urine: 1.021 (ref 1.005–1.030)
pH: 5 (ref 5.0–8.0)

## 2017-12-23 LAB — COMPREHENSIVE METABOLIC PANEL
ALT: 28 U/L (ref 14–54)
AST: 26 U/L (ref 15–41)
Albumin: 4.2 g/dL (ref 3.5–5.0)
Alkaline Phosphatase: 65 U/L (ref 38–126)
Anion gap: 10 (ref 5–15)
BUN: 10 mg/dL (ref 6–20)
CO2: 22 mmol/L (ref 22–32)
Calcium: 9.4 mg/dL (ref 8.9–10.3)
Chloride: 105 mmol/L (ref 101–111)
Creatinine, Ser: 0.66 mg/dL (ref 0.44–1.00)
GFR calc Af Amer: >60 mL/min (ref >60)
GFR calc non Af Amer: >60 mL/min (ref >60)
Glucose, Bld: 78 mg/dL (ref 65–99)
Potassium: 4.3 mmol/L (ref 3.5–5.1)
Sodium: 137 mmol/L (ref 135–145)
Total Bilirubin: 0.8 mg/dL (ref 0.3–1.2)
Total Protein: 7.5 g/dL (ref 6.5–8.1)

## 2017-12-23 LAB — CBC WITH DIFFERENTIAL/PLATELET
Basophils Absolute: 0.1 10*3/uL (ref 0.0–0.1)
Basophils Relative: 0 %
Eosinophils Absolute: 0.3 10*3/uL (ref 0.0–0.7)
Eosinophils Relative: 2 %
HCT: 47.7 % — ABNORMAL HIGH (ref 36.0–46.0)
Hemoglobin: 16 g/dL — ABNORMAL HIGH (ref 12.0–15.0)
Lymphocytes Relative: 36 %
Lymphs Abs: 4.3 10*3/uL — ABNORMAL HIGH (ref 0.7–4.0)
MCH: 29.6 pg (ref 26.0–34.0)
MCHC: 33.5 g/dL (ref 30.0–36.0)
MCV: 88.2 fL (ref 78.0–100.0)
Monocytes Absolute: 1 10*3/uL (ref 0.1–1.0)
Monocytes Relative: 9 %
Neutro Abs: 6.2 10*3/uL (ref 1.7–7.7)
Neutrophils Relative %: 53 %
Platelets: 280 10*3/uL (ref 150–400)
RBC: 5.41 MIL/uL — ABNORMAL HIGH (ref 3.87–5.11)
RDW: 14.4 % (ref 11.5–15.5)
WBC: 11.8 10*3/uL — ABNORMAL HIGH (ref 4.0–10.5)

## 2017-12-23 MED ORDER — MORPHINE SULFATE (PF) 4 MG/ML IV SOLN
6.0000 mg | Freq: Once | INTRAVENOUS | Status: AC
  Administered 2017-12-23: 6 mg via INTRAVENOUS
  Filled 2017-12-23: qty 2

## 2017-12-23 MED ORDER — METHOCARBAMOL 1000 MG/10ML IJ SOLN
500.0000 mg | Freq: Once | INTRAVENOUS | Status: AC
  Administered 2017-12-23: 500 mg via INTRAVENOUS
  Filled 2017-12-23: qty 5

## 2017-12-23 MED ORDER — OXYCODONE HCL 5 MG PO TABS
10.0000 mg | ORAL_TABLET | Freq: Once | ORAL | Status: AC
  Administered 2017-12-23: 10 mg via ORAL
  Filled 2017-12-23: qty 2

## 2017-12-23 MED ORDER — METHOCARBAMOL 1000 MG/10ML IJ SOLN
1000.0000 mg | Freq: Once | INTRAMUSCULAR | Status: DC
  Filled 2017-12-23: qty 10

## 2017-12-23 MED ORDER — METHYLPREDNISOLONE SODIUM SUCC 125 MG IJ SOLR
125.0000 mg | Freq: Once | INTRAMUSCULAR | Status: AC
  Administered 2017-12-23: 125 mg via INTRAVENOUS
  Filled 2017-12-23: qty 2

## 2017-12-23 MED ORDER — OXYCODONE HCL 5 MG PO TABS: 5.0000 mg | ORAL_TABLET | ORAL | 0 refills | Status: DC | PRN

## 2017-12-23 MED ORDER — PREDNISONE 20 MG PO TABS: ORAL_TABLET | ORAL | 0 refills | Status: DC

## 2017-12-23 MED ORDER — METHOCARBAMOL 1000 MG/10ML IJ SOLN: 1000.0000 mg | Freq: Once | INTRAMUSCULAR | Status: DC

## 2017-12-23 MED ORDER — METHOCARBAMOL 500 MG PO TABS: 500.0000 mg | ORAL_TABLET | Freq: Two times a day (BID) | ORAL | 0 refills | Status: DC | PRN

## 2017-12-23 NOTE — Telephone Encounter (Signed)
This encounter was created in error - please disregard.

## 2017-12-23 NOTE — ED Notes (Signed)
Pt verbalizes understanding of d/c instructions. Pt received prescriptions. Pt ambulatory at d/c with all belongings.  

## 2017-12-23 NOTE — ED Notes (Signed)
Patient transported to MRI 

## 2017-12-23 NOTE — ED Notes (Signed)
Pt ambulatory to restroom. Pt reports pain is tolerable and she is able to ambulate to restroom independently without difficulties.

## 2017-12-23 NOTE — ED Provider Notes (Signed)
Emergency Department Provider Note   I have reviewed the triage vital signs and the nursing notes.   HISTORY  Chief Complaint Back Pain   HPI Mackenzie Meyers is a 47 y.o. female with multiple medical problems but a known L4-L5 bulging disc which she has been getting conservative treatment trying to delay surgery for the last month or so.  She had acute onset of a sharp severe pain coming from her lower back that radiated down her left leg on the lateral portion down to her foot.  It was stinging and sharp in nature than she had some paresthesias since that time.  She also had some paresthesias on the right side initially but that seems to have improved with positioning at this point.  Symptoms are made worse by certain positions and improved in other positions.  States she is never had anything this bad with her back pain in the past.  She not had any urinary or fecal incontinence.  She has no saddle anesthesia.  She does state that she has decreased sensation in her left leg.  Had gestational diabetes but not outside of pregnancy.  No history of IV drug use, diabetes, cancer, trauma or prolonged steroid use. No other associated or modifying symptoms.    Past Medical History:  Diagnosis Date  . Anxiety    currently managed on CBD oil, was on medication briefly but made her OCD worse  . Arthritis    hands  . Breast mass, right 09/2012   removed because it was painful, was found to be calcified scar tissue  . Cervical dysplasia age 70  . GDM (gestational diabetes mellitus)    with last pregnancy  . Geographical tongue   . 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 History of kidney stones 10/2011  . 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 Hx of migraines   . OCD (obsessive compulsive disorder)    cleanliness with home and workspace  . Rosacea   . Sinus congestion 10/12/2012  . Sleep apnea 04/2017   patient is wearing C-pap    . Tobacco abuse 1989   max was 1.5 packs per Chien  . Vitamin D deficiency 10/2013   Value 29  . Vitiligo     Patient Active Problem List   Diagnosis Date Noted  . Obstructive sleep apnea treated with continuous positive airway pressure (CPAP) 12/08/2017  . Lumbar back pain with radiculopathy affecting left lower extremity 11/24/2017  . History of gestational diabetes mellitus 11/18/2017  . OCD (obsessive compulsive disorder)   . Hx of migraines   . Arthritis   . Sleep apnea 04/26/2017  . Vitamin D deficiency 10/24/2013  . History of kidney stones 10/25/2011  . Vitiligo   . Anxiety   . Tobacco abuse 08/27/1987    Past Surgical History:  Procedure Laterality Date  . ABDOMINOPLASTY  2008  . BREAST BIOPSY Right 10/15/2012   Procedure: BREAST BIOPSY;  Surgeon: Haywood Lasso, MD;  Location: [facility];  Service: General;  Laterality: Right;  removal right breast mass  . BREAST SURGERY     Lift  . CESAREAN SECTION  2004, 2007  . CESAREAN SECTION N/A 02/07/2015   Procedure: CESAREAN SECTION;  Surgeon: Jerelyn Charles, MD;  Location: [facility];  Service: Obstetrics;  Laterality: N/A;  EDD: 02/13/15   . dialation and cutterage    . GYNECOLOGIC CRYOSURGERY  age  [place] INSERTION  03/2015   Mirena at [facility]  . TONSILLECTOMY  2000    Current Outpatient Rx  . Order #: [id number] Class: Historical Med  . Order #: [id number] Class: Historical Med  . Order #: [id number] Class: Normal  . Order #: [id number] Class: Normal  . Order #: [id number] Class: Historical Med  . Order #: [id number] Class: Historical Med  . Order #: [id number] Class: Normal  . Order #: [id number] Class: Print  . Order #: [id number] Class: Historical Med  . Order #: [id number] Class: Historical Med  . Order #: [id number] Class: Print  . Order #: [id number] Class: Print  . Order #: [id number] Class: Normal  . Order #: [id number] Class: Normal    Allergies Ampicillin; Erythromycin; Other; Wine  [alcohol]; and Ciprofloxacin  Family History  Problem Relation Age of Onset  . Arthritis Mother   . High Cholesterol Mother   . High blood pressure Mother   . Hearing loss Father   . High Cholesterol Father   . High blood pressure Father   . Cancer Maternal Grandfather        lymphoma  . Cancer Paternal Grandfather        liver  . Alcohol abuse Paternal Grandfather   . Lung cancer Maternal Grandmother        and maternal uncle; small cell   . Arthritis Maternal Grandmother   . Hyperlipidemia Maternal Grandmother   . Hypertension Maternal Grandmother   . Hearing loss Maternal Grandmother   . Cancer Maternal Uncle        Lung cancer  . Hyperlipidemia Other   . Asthma Brother   . Alcohol abuse Paternal Grandmother   . Hypertension Paternal Grandmother   . Colon cancer Neg Hx   . Breast cancer Neg Hx     Social History Social History   Tobacco Use  . Smoking status: Current Every Ribeiro Smoker    Packs/Cavanaugh: 0.50    Last attempt to quit: 08/17/2012    Years since quitting: 5.3  . Smokeless tobacco: Never Used  . Tobacco comment: restarted 10/17 d/t husband losing job  Substance Use Topics  . Alcohol use: Yes    Comment: rare, twice a year  . Drug use: No    Review of Systems  All other systems negative except as documented in the HPI. All pertinent positives and negatives as reviewed in the HPI. ____________________________________________   PHYSICAL EXAM:  VITAL SIGNS: ED Triage Vitals  Enc Vitals Group     BP 12/23/17 1240 138/77     Pulse Rate 12/23/17 1240 91     Resp 12/23/17 1240 16     Temp 12/23/17 1240 (!) 97.5 F (36.4 C)     Temp Source 12/23/17 1240 Oral     SpO2 12/23/17 1240 100 %     Weight 12/23/17 1242 197 lb (89.4 kg)     Height 12/23/17 1242 5\' 4"  (1.626 m)    Constitutional: Alert and oriented. Well appearing and in no acute distress. Eyes: Conjunctivae are normal. PERRL. EOMI. Head: Atraumatic. Nose: No  congestion/rhinnorhea. Mouth/Throat: Mucous membranes are moist.  Oropharynx non-erythematous. Neck: No stridor.  No meningeal signs.   Cardiovascular: Normal rate, regular rhythm. Good peripheral circulation. Grossly normal heart sounds.   Respiratory: Normal respiratory effort.  No retractions. Lungs CTAB. Gastrointestinal: Soft and nontender. No distention.  Musculoskeletal: No lower extremity tenderness nor edema. No gross deformities of extremities. Has lower midline back ttp.  Neurologic:  Normal speech and language. No gross focal neurologic deficits are  appreciated aside from decreased sensation in left leg. Also a slight decrease in strength of her left ankle dorsiflexion and plantar flexion. Difficult to ascertain if related to pain.  Skin:  Skin is warm, dry and intact. No rash noted.   ____________________________________________   LABS (all labs ordered are listed, but only abnormal results are displayed)  Labs Reviewed  CBC WITH DIFFERENTIAL/PLATELET - Abnormal; Notable for the following components:      Result Value   WBC 11.8 (*)    RBC 5.41 (*)    Hemoglobin 16.0 (*)    HCT 47.7 (*)    Lymphs Abs 4.3 (*)    All other components within normal limits  URINALYSIS, ROUTINE W REFLEX MICROSCOPIC  COMPREHENSIVE METABOLIC PANEL   ____________________________________________  RADIOLOGY  Mr Lumbar Spine Wo Contrast  Result Date: 12/23/2017 CLINICAL DATA:  Acute onset extreme low back pain radiating into RIGHT leg, unable to walk. History of disc extrusion, status post epidural injection 2 weeks ago. EXAM: MRI LUMBAR SPINE WITHOUT CONTRAST TECHNIQUE: Multiplanar, multisequence MR imaging of the lumbar spine was performed. No intravenous contrast was administered. COMPARISON:  MRI of the lumbar spine November 30, 2017 FINDINGS: SEGMENTATION: For the purposes of this report, the last well-formed intervertebral disc is reported as L5-S1. ALIGNMENT: Maintained lumbar lordosis. Minimal  grade 1 L5-S1 retrolisthesis. VERTEBRAE:Vertebral bodies are intact. Stable moderate L5-S1 disc height loss with disc desiccation and mild subacute discogenic endplate changes. No acute or abnormal bone marrow signal. Scattered hemangiomas. CONUS MEDULLARIS AND CAUDA EQUINA: Conus medullaris terminates at L1-2 and demonstrates normal morphology and signal characteristics. Cauda equina is normal. PARASPINAL AND OTHER SOFT TISSUES: Included prevertebral and paraspinal soft tissues are normal. DISC LEVELS: L1-2 through L3-4: No disc bulge, canal stenosis nor neural foraminal narrowing. L4-5: Annular bulging. Mild facet arthropathy and ligamentum flavum redundancy without canal stenosis or neural foraminal narrowing. L5-S1: Large central disc protrusion with superimposed 8 x 6 mm LEFT central disc extrusion, 8 mm contiguous superior migration similar to prior examination. Completely effaced LEFT lateral recess with posterior displacement of the traversing S1 nerve. Protrusion laterally displaces the traversing RIGHT S1 nerve. Moderate canal stenosis. No neural foraminal narrowing. IMPRESSION: 1. Stable minimal grade 1 L5-S1 retrolisthesis without spondylolysis or acute osseous process. 2. Similar large L5-S1 disc extrusion resulting in moderate canal stenosis and LEFT greater than RIGHT S1 nerve impingement. Electronically Signed   By: Elon Alas M.D.   On: 12/23/2017 20:24    ____________________________________________   PROCEDURES  Procedure(s) performed:   Procedures   ____________________________________________   INITIAL IMPRESSION / ASSESSMENT AND PLAN / ED COURSE  Paresthesias and acute worsening of pain, will MRI to ensure no significant nerve impingement. Also pain meds/muscle relaxer.   MRI unchanged from previous.  Discussed case with Dr. Vertell Limber with neurosurgery who will see her in the office but recommends a steroid taper and pain control.  Patient with significant improvement  with a couple oxycodone.  I advised that she uses very limited and only if absolutely necessary along with Robaxin.  She will call Dr. Melven Sartorius office to make an appointment in the morning.   Pertinent labs & imaging results that were available during my care of the patient were reviewed by me and considered in my medical decision making (see chart for details).  ____________________________________________  FINAL CLINICAL IMPRESSION(S) / ED DIAGNOSES  Final diagnoses:  Acute left-sided low back pain with sciatica, sciatica laterality unspecified     MEDICATIONS GIVEN DURING THIS VISIT:  Medications  morphine 4 MG/ML injection 6 mg (6 mg Intravenous Given 12/23/17 1830)  methocarbamol (ROBAXIN) 500 mg in dextrose 5 % 50 mL IVPB (0 mg Intravenous Stopped 12/23/17 2106)  oxyCODONE (Oxy IR/ROXICODONE) immediate release tablet 10 mg (10 mg Oral Given 12/23/17 2131)  methylPREDNISolone sodium succinate (SOLU-MEDROL) 125 mg/2 mL injection 125 mg (125 mg Intravenous Given 12/23/17 2133)     NEW OUTPATIENT MEDICATIONS STARTED DURING THIS VISIT:  Discharge Medication List as of 12/23/2017 10:45 PM    START taking these medications   Details  methocarbamol (ROBAXIN) 500 MG tablet Take 1 tablet (500 mg total) by mouth 2 (two) times daily as needed for muscle spasms., Starting Tue 12/23/2017, Print    oxyCODONE (ROXICODONE) 5 MG immediate release tablet Take 1-2 tablets (5-10 mg total) by mouth every 4 (four) hours as needed for severe pain or breakthrough pain., Starting Tue 12/23/2017, Print    predniSONE (DELTASONE) 20 MG tablet 3 tabs po daily x 3 days, then 2 tabs x 3 days, then 1.5 tabs x 3 days, then 1 tab x 3 days, then 0.5 tabs x 3 days, Print        Note:  This note was prepared with assistance of Dragon voice recognition software. Occasional wrong-word or sound-a-like substitutions may have occurred due to the inherent limitations of voice recognition software.   Merrily Pew,  MD 12/23/17 2352

## 2017-12-23 NOTE — Telephone Encounter (Signed)
Pt's husband calling in regarding his wife's back. She has a herniated disc in L5 and the one above it is bulging. She has had one epidural and is on gabapentin and lidocaine. She went down the stairs earlier and coming back up the stairs the pain hit her so bad it took her breath away and she could barely stand up. She states its worse than the initial pain before ever being seen by the doctor. They are wanting advise on what to do if they should get her comfortable at home or if she should go to the ER for an MRI.

## 2017-12-23 NOTE — ED Triage Notes (Signed)
Pt states L4 is "bulging" and L5 is a herniated disc. Pt received epidural two weeks ago for pain. Pt was walking at home this morning when she felt extreme pain in her lower back and into leg foot and right leg. Pt states this is the worst pain she has experienced with her back problems. Pt unable to walk due to pain.

## 2017-12-23 NOTE — ED Notes (Signed)
ED Provider at bedside. 

## 2017-12-23 NOTE — Telephone Encounter (Signed)
Patient currently at ED

## 2017-12-23 NOTE — Telephone Encounter (Signed)
Pt's husband calling, wife present during call. Pt with h/o herniated disc L5-S1; recent epidural steroid injection 12/04/17. Husband reports wife walked up stairs and experienced sudden 10/10 pain, 1 hour prior to NT call.  Reports still with 10/10 pain. States left foot "tingling" x 3 weeks now occurring in right foot. Pt directed to ED, husband will drive, states he may call EMS for transport.    Reason for Disposition . Patient sounds very sick or weak to the triager  Answer Assessment - Initial Assessment Questions 1. ONSET: "When did the pain begin?"      1 hour ago 2. LOCATION: "Where does it hurt?" (upper, mid or lower back)     Lower back 3. SEVERITY: "How bad is the pain?"  (e.g., Scale 1-10; mild, moderate, or severe)   - MILD (1-3): doesn't interfere with normal activities    - MODERATE (4-7): interferes with normal activities or awakens from sleep    - SEVERE (8-10): excruciating pain, unable to do any normal activities      10/10 4. PATTERN: "Is the pain constant?" (e.g., yes, no; constant, intermittent)      Yes, "A little lesser at times, not much." 5. RADIATION: "Does the pain shoot into your legs or elsewhere?"     Left foot tingling x 3 weeks; now right foot 6. CAUSE:  "What do you think is causing the back pain?"      H/O herniated disc L5-S1, epidural steroid injection 12/04/17 7. BACK OVERUSE:  "Any recent lifting of heavy objects, strenuous work or exercise?"     no 8. MEDICATIONS: "What have you taken so far for the pain?" (e.g., nothing, acetaminophen, NSAIDS)     no 9. NEUROLOGIC SYMPTOMS: "Do you have any weakness, numbness, or problems with bowel/bladder control?"     Tingling  both feet 10. OTHER SYMPTOMS: "Do you have any other symptoms?" (e.g., fever, abdominal pain, burning with urination, blood in urine)       no  Protocols used: BACK PAIN-A-AH

## 2017-12-24 ENCOUNTER — Other Ambulatory Visit: Payer: Self-pay | Admitting: Sports Medicine

## 2017-12-24 ENCOUNTER — Other Ambulatory Visit: Payer: PRIVATE HEALTH INSURANCE

## 2017-12-25 ENCOUNTER — Ambulatory Visit (INDEPENDENT_AMBULATORY_CARE_PROVIDER_SITE_OTHER): Payer: PRIVATE HEALTH INSURANCE | Admitting: Neurology

## 2017-12-25 ENCOUNTER — Encounter: Payer: Self-pay | Admitting: Neurology

## 2017-12-25 ENCOUNTER — Ambulatory Visit: Payer: PRIVATE HEALTH INSURANCE | Admitting: Adult Health

## 2017-12-25 ENCOUNTER — Encounter (INDEPENDENT_AMBULATORY_CARE_PROVIDER_SITE_OTHER): Payer: PRIVATE HEALTH INSURANCE | Admitting: Physical Medicine and Rehabilitation

## 2017-12-25 VITALS — BP 122/70 | HR 84 | Ht 64.0 in | Wt 200.0 lb

## 2017-12-25 DIAGNOSIS — G47419 Narcolepsy without cataplexy: Secondary | ICD-10-CM | POA: Insufficient documentation

## 2017-12-25 DIAGNOSIS — G4733 Obstructive sleep apnea (adult) (pediatric): Secondary | ICD-10-CM

## 2017-12-25 DIAGNOSIS — G4710 Hypersomnia, unspecified: Secondary | ICD-10-CM

## 2017-12-25 DIAGNOSIS — Z9989 Dependence on other enabling machines and devices: Secondary | ICD-10-CM | POA: Diagnosis not present

## 2017-12-25 DIAGNOSIS — M5126 Other intervertebral disc displacement, lumbar region: Secondary | ICD-10-CM | POA: Insufficient documentation

## 2017-12-25 DIAGNOSIS — G4719 Other hypersomnia: Secondary | ICD-10-CM | POA: Insufficient documentation

## 2017-12-25 NOTE — Patient Instructions (Signed)
you may have a condition called narcolepsy: This means, that you could have a sleep disorder that manifests with at times severe excessive sleepiness during the Lasky and often with problems with sleep at night. We may have to try different medications that may help you stay awake during the Ardolino. Not everything works with everybody the same way. Wake promoting agents include stimulants and non-stimulant type medications. The most common side effects with stimulants are weight loss, insomnia, nervousness, headaches, palpitations, rise in blood pressure, anxiety. Stimulants can be addictive and subject to abuse. Non-stimulant type wake promoting medications include Provigil and Nuvigil, most common side effects include headaches, nervousness, insomnia, hypertension. In addition there is a medication called Xyrem which has been proven to be very effective in patients with narcolepsy with or without cataplexy. Some patients with narcolepsy report episodes of weakness, such as jaw or facial weakness, legs giving out, feeling wobbly or like "Jell-o", etc. in situations of anxiety, stress, laughter, sudden sadness, surprise, etc., which is called cataplexy.  You can also experience episodes of sleep paralysis during which you may feel unable to move upon awakening. Some people experience dreamlike sequences upon awakening or upon drifting off to sleep, called hypnopompic or hypnagogic hallucinations.    Hypersomnia Hypersomnia is when you feel extremely tired during the Dugger even though you're getting plenty of sleep at night. You may need to take naps during the Melfi, and you may also be extremely difficult to wake up when you are sleeping. What are the causes? The cause of your hypersomnia may not be known. Hypersomnia may be caused by:  Medicines.  Sleep disorders, such as narcolepsy.  Trauma or injury to your head or nervous system.  Using drugs or alcohol.  Tumors.  Medical conditions, such as  depression or hypothyroidism.  Genetics.  What are the signs or symptoms? The main symptoms of hypersomnia include:  Feeling extremely tired throughout the P[place].  Being very difficult to wake up.  Sleeping for longer and longer periods.  Taking naps throughout the [place].  Other symptoms may include:  Feeling: ? Restless. ? Annoyed. ? Anxious. ? Low energy.  Having difficulty: ? Remembering. ? Speaking. ? Thinking.  Losing your appetite.  Experiencing hallucinations.  How is this diagnosed? Hypersomnia may be diagnosed by:  Medical history and physical exam. This will include a sleep history.  Completing sleep logs.  Tests may also be done, such as: ? Polysomnography. ? Multiple sleep latency test (MSLT).  How is this treated? There is no cure for hypersomnia, but treatment can be very effective in helping manage the condition. Treatment may include:  Lifestyle and sleeping strategies to help cope with the condition.  Stimulant medicines.  Treating any underlying causes of hypersomnia.  Follow these instructions at home:  Take medicines only as directed by your health care provider.  Schedule short naps for when you feel sleepiest during the Canty. Tell your employer or teachers that you have hypersomnia. You may be able to adjust your schedule to include time for naps.  Avoid drinking alcohol or caffeinated beverages.  Do not eat a heavy meal before bedtime. Eat at about the same times every Sab[place].  Do not drive or operate heavy machinery if you are sleepy.  Do not swim or go out on the water without a life jacket.  If possible, adjust your schedule so that you do not have to work or be active at night.  Keep all follow-up visits as directed by your  health care provider. This is important. Contact a health care provider if:  You have new symptoms.  Your symptoms get worse. Get help right away if: You have serious thoughts of hurting yourself or  someone else. This information is not intended to replace advice given to you by your health care provider. Make sure you discuss any questions you have with your health care provider. Document Released: 08/02/2002 Document Revised: 01/18/2016 Document Reviewed: 03/17/2014 Elsevier Interactive Patient Education  [employer].

## 2017-12-25 NOTE — Progress Notes (Signed)
SLEEP MEDICINE CLINIC   Provider:  Larey Meyers, M D  Referring Provider: Inda Coke, PA Primary Care Physician:  Mackenzie Meyers, [place]  Chief Complaint  Patient presents with  . Follow-up    pt with husband, rm 31, pt states that on recent visit despite being on CPAP. she is wandering if she might have narcolepsy, if she sits on the couch to take a break she does nod off. she tries to stay busy  to avoid falling asleep but if she has the chance she can take a nap and it be 2-4 hours    HPI:  Mackenzie Meyers is a 47 y.o. female , was seen here as a referral from Dr. Morene Meyers for a sleep evaluation, today is follow up on her sleep study.  With CPAP area in a study from 11/15/2016 with very mild apnea and may say at an AHI of 6.4 per hour but during REM sleep accentuated to 33.3 per hour. Her Epworth sleepiness score was endorsed at 15 points and for this reason he tried CPAP. She came back for a CPAP titration on 12/06/2016, tolerated CPAP very well and needed only 6 cm water. She was initially fitted with an air fit P 10 and small size, she states that she saw developed irritation of the nostrils and was changed to a nasal mask. Nasal mask no pushes on the bridge of her nose seems to aggravate her sinuses restrict her breathing. She will get in touch with her DM E, [employer], and see if she can switch to a wisp or to a dream wear mask.  Mackenzie Meyers also did a very good effort at compliance, over the last 30 days she used the device every [place], 29 days over 4 hours, which a compliance of 97%, average of 7 hours and 32 minutes of use, CPAP is set at 6 cm water pressure with 3 cm EPR and her residual AHI is only 0.5 apneas per hour. There are no desaturations in o2 ,according to her CPAP titration study ,while  the patient uses CPAP.   3/ 2018 According to the referral notes the patient is concerned about fatigue and her feeling of lacking energy. She feels exhausted many days she has diffuse joint  aches and pains and has been seen by primary care and rheumatology, has had lupus testing and thyroid checks. Mackenzie Meyers has been acutely developing but rather gradually over time. For many years she lacked insurance so she couldn't have it checked out. She will sleep at night but still will have the need to take naps in the daytime on those days where she is not working. He was taking a nap she doesn't have issues to fall asleep at night. Her husband has told her that she is snores loudly, she wakes up multiple times at night it does not feel like she gets restorative sleep. She has a history of some anxiety and OCD but feels it is somewhat under control and she denies chest pain, palpitations, diaphoresis or dizziness. Once a week she may wake up with headaches but she has many other days with headaches. The headaches do not wake her up. Headaches are usually located above the right eye and temple.  She had given birth to her third child 49 month ago, at age 47 , but fatigue proceeded the pregnancy.   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 Her youngest daughter naps at 50 AM and she takes a nap alongside for 1-1.5 hours .  She may sleep up to 3 hours while the youngest, Mackenzie Meyers,  is in daycare. She has no trouble sleeping again at her  nocturnal Bedtime, which  is between 9 and 9:30 PM. Her marital bedroom is cool , quiet and dark. Dog " Mackenzie Meyers"  sleeps in the bedroom, but not in the bed.  She prefers to sleep on her side, on one pillow.  Sleep medical history and family sleep history: brother and father are using CPAP for OSA.  No enuresis, sleep walking, no TBI  . Oldest son had enuresis and night terrors.  Social history: She is a 25 year history of smoking and recently relapsed. She is determined to quit this month. Very seldomly drinks alcohol, caffeine use: One caffeinated soda  a Overley, only decaffeinated  ice tea and coffee, 1-2 a [place]. 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  I had the pleasure of seeing Mackenzie Meyers today and a re-visit, the patient underwent a baseline polysomnogram 3 on 15 November 2016 at the time referral deferred by family nurse practitioner Mackenzie Meyers.  The study had confirmed that the patient has mild apnea which exacerbated only during REM sleep to a significant degree.  She did not have prolonged oxygen desaturations she was treated with CPAP and CPAP treatment was successful in eliminating apnea, she still has some periodic limb movements today we need to see the CPAP compliance the patient has been 90% compliant 27 out of 30 days she has used her machine for over 4 consecutive hours on average 6 hours and 10 minutes at night CPAP is set to 6 cm water pressure with 3 cm EPR residual AHI is only 0.3 excellent resolution and very few air leaks the patient remains however fatigued and endorsed the fatigue severity score of 56 points, the Epworth sleepiness score remains at 16 points. We are meeting to look at narcolepsy and other causes of hypersomnia.      Review of Systems: Out of a complete 14 system review, the patient complains of only the following symptoms, and all other reviewed systems are negative.  The patient reports fatigue, snoring, joint pain, anxiety, decreased level of energy possible apnea always feeling tired napping daily, she also has vitiligo, history of OCD, migraine, IIIc injections, tonsillectomy in the year 2000 as an adult, abdominoplasty 2008 surgery of the left breast 2008. Hypersomnia, persistent  while  compliant with CPAP.  She falls asleep which feels like a [place] dream to her, but her husband noted she sleeps. 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  h gets 6-7 hours nocturnal sleep. 9 PM and up at 6.30.   Epworth score 16/24 , Fatigue severity score  38 from 63 , depression score 2 out 15. All numbers  improved somewhat under CPAP.   Social History   Socioeconomic History  . Marital status: Married    Spouse name: Not on file  . Number of children: 3  . Years of education: Not on file  . Highest education level: Not on file  Occupational History  . Not on file  Social Needs  . Financial resource strain: Not on file  . Food insecurity:    Worry: Not on file    Inability: Not on file  . Transportation needs:    Medical: Not on file  Non-medical: Not on file  Tobacco Use  . Smoking status: Current Every [place] Smoker    Packs/[place]illey: 0.50    Last attempt to quit: 08/17/2012    Years since quitting: 5.3  . Smokeless tobacco: Never Used  . Tobacco comment: restarted 10/17 d/t husband losing job  Substance and Sexual Activity  . Alcohol use: Yes    Comment: rare, twice a year  . Drug use: No  . Sexual activity: Yes    Partners: Male    Birth control/protection: IUD    Comment: Mirena inserted 03-2015-1st intercourse 47 yo-More than 5 partners  Lifestyle  . Physical activity:    Days per week: Not on file    Minutes per session: Not on file  . Stress: Not on file  Relationships  . Social connections:    Talks on phone: Not on file    Gets together: Not on file    Attends religious service: Not on file    Active member of club or organization: Not on file    Attends meetings of clubs or organizations: Not on file    Relationship status: Not on file  . Intimate partner violence:    Fear of current or ex partner: Not on file    Emotionally abused: Not on file    Physically abused: Not on file    Forced sexual activity: Not on file  Other Topics Concern  . Not on file  Social History Narrative   Homemaker   2 boys (73 and 44)   42.45 year old girl   Does Medieval recreator; now sews costumes    Family History  Problem Relation Age of Onset  . Arthritis Mother   . High Cholesterol Mother   . High blood pressure Mother   . Hearing loss Father   . High Cholesterol  Father   . High blood pressure Father   . Cancer Maternal Grandfather        lymphoma  . Cancer Paternal Grandfather        liver  . Alcohol abuse Paternal Grandfather   . Lung cancer Maternal Grandmother        and maternal uncle; small cell   . Arthritis Maternal Grandmother   . Hyperlipidemia Maternal Grandmother   . Hypertension Maternal Grandmother   . Hearing loss Maternal Grandmother   . Cancer Maternal Uncle        Lung cancer  . Hyperlipidemia Other   . Asthma Brother   . Alcohol abuse Paternal Grandmother   . Hypertension Paternal Grandmother   . Colon cancer Neg Hx   . Breast cancer Neg Hx     Past Medical History:  Diagnosis Date  . Anxiety    currently managed on CBD oil, was on medication briefly but made her OCD worse  . Arthritis    hands  . Breast mass, right 09/2012   removed because it was painful, was found to be calcified scar tissue  . Cervical dysplasia age 69  . GDM (gestational diabetes mellitus)    with last pregnancy  . Geographical tongue   . 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 History of kidney stones 10/2011  . 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 Hx of migraines   . OCD (obsessive compulsive disorder)    cleanliness with home and workspace  . Rosacea   . Sinus congestion 10/12/2012  . Sleep apnea 04/2017  patient is wearing C-pap  . Tobacco abuse 1989   max was 1.5 packs per [facility]  . Vitamin D deficiency 10/2013   Value 29  . Vitiligo     Past Surgical History:  Procedure Laterality Date  . ABDOMINOPLASTY  2008  . BREAST BIOPSY Right 10/15/2012   Procedure: BREAST BIOPSY;  Surgeon: Haywood Lasso, MD;  Location: [facility];  Service: General;  Laterality: Right;  removal right breast mass  . BREAST SURGERY     Lift  . CESAREAN SECTION  2004, 2007  . CESAREAN SECTION N/A 02/07/2015   Procedure: CESAREAN SECTION;  Surgeon: Jerelyn Charles, MD;   Location: [facility];  Service: Obstetrics;  Laterality: N/A;  EDD: 02/13/15   . dialation and cutterage    . GYNECOLOGIC CRYOSURGERY  age 87  . INTRAUTERINE DEVICE INSERTION  03/2015   Mirena at [facility]  . TONSILLECTOMY  2000    Current Outpatient Medications  Medication Sig Dispense Refill  . acetaminophen (TYLENOL) 500 MG tablet Take 1,000 mg by mouth every 8 (eight) hours as needed (alternated with motrin).    . Ascorbic Acid (VITAMIN C) 1000 MG tablet Take 1,000 mg by mouth daily.     . cyclobenzaprine (FLEXERIL) 10 MG tablet Take 1 tablet (10 mg total) by mouth at bedtime. 30 tablet 1  . gabapentin (NEURONTIN) 300 MG capsule TAKE 1 CAPSULE BY MOUTH THREE TIMES A Hildreth 270 capsule 0  . ibuprofen (ADVIL,MOTRIN) 200 MG tablet Take 800 mg by mouth every 8 (eight) hours as needed. Patient takes 4 at a time     . levonorgestrel (MIRENA) 20 MCG/24HR IUD 1 each by Intrauterine route once. Inserted by GYN on 03/2015 needs to be removed 03/2020.    . lidocaine (LIDODERM) 5 % Place 1 patch onto the skin daily. Remove & Discard patch within 12 hours or as directed by MD 30 patch 0  . methocarbamol (ROBAXIN) 500 MG tablet Take 1 tablet (500 mg total) by mouth 2 (two) times daily as needed for muscle spasms. 20 tablet 0  . methylPREDNISolone (MEDROL DOSEPAK) 4 MG TBPK tablet     . mometasone (NASONEX) 50 MCG/ACT nasal spray Place 2 sprays into the nose 2 (two) times daily.    Marland Kitchen OVER THE COUNTER MEDICATION Place 750 mg under the tongue daily. CBD Oil    . oxyCODONE (ROXICODONE) 5 MG immediate release tablet Take 1-2 tablets (5-10 mg total) by mouth every 4 (four) hours as needed for severe pain or breakthrough pain. 30 tablet 0  . predniSONE (DELTASONE) 20 MG tablet 3 tabs po daily x 3 days, then 2 tabs x 3 days, then 1.5 tabs x 3 days, then 1 tab x 3 days, then 0.5 tabs x 3 days 27 tablet 0  . valACYclovir (VALTREX) 500 MG tablet Take 1 tablet (500 mg total) by mouth daily. 90 tablet 4  . Vitamin  D, Ergocalciferol, (DRISDOL) 50000 units CAPS capsule Take 1 capsule (50,000 Units total) by mouth every 7 (seven) days. 12 capsule 0   No current facility-administered medications for this visit.     Allergies as of 12/25/2017 - Review Complete 12/25/2017  Allergen Reaction Noted  . Ampicillin Hives 03/25/2011  . Erythromycin Nausea And Vomiting 11/04/2011  . Other Hives 10/12/2012  . Wine [alcohol] Hives 10/02/2017  . Ciprofloxacin Hives and Rash 10/27/2012    Vitals: BP 122/70   Pulse 84   Ht 5' 4"  (1.626 m)   Wt 200  lb (90.7 kg)   BMI 34.33 kg/m  Last Weight:  Wt Readings from Last 1 Encounters:  12/25/17 200 lb (90.7 kg)   LPN:PYYF mass index is 34.33 kg/m.     Last Height:   Ht Readings from Last 1 Encounters:  12/25/17 5' 4"  (1.626 m)    Physical exam:  General: The patient is awake, alert and appears not in acute distress. The patient is well groomed. Head: Normocephalic, atraumatic. Neck is supple. Mallampati 3,  neck circumference 2. Nasal airflow patent,  Cardiovascular:  Regular rate and rhythm, without  murmurs or carotid bruit, and without distended neck veins. Respiratory: Lungs are not clear to auscultation- mild wheezing, rhonci . Skin:  Facial rash at the bridge of the nose.  Trunk: BMI is 33. The patient's posture is erect.  Neurologic exam :The patient is awake and alert, oriented to place and time. Mood and affect are appropriate. Cranial nerves: Pupils are equal and briskly reactive to light. Hearing to finger rub intact. Facial sensation intact to fine touch. Facial motor strength is symmetric , her tongue and uvula move midline. Shoulder shrug was symmetrical. Tongue protrusion is strong.   Motor exam: Normal tone, muscle bulk and symmetric strength in all extremities. Strength within normal limits.Stance is stable and normal.   Deep tendon reflexes: in the  upper and lower extremities are symmetric, rather brisk-  Babinski maneuver deferred.  The  patient was advised of the nature of the diagnosed sleep disorder , the treatment options and risks for general a health and wellness arising from not treating the condition.  I spent more than 25  minutes of face to face time with the patient. Greater than 50% of time was spent in counseling and coordination of care. We have discussed the diagnosis and differential and I answered the patient's questions.     Assessment:  After physical and neurologic examination, review of laboratory studies,  Personal review of imaging studies, reports of other /same  Imaging studies ,  Results of polysomnography/ neurophysiology testing and pre-existing records as far as provided in visit., my assessment is   1)  Mackenzie Meyers was diagnosed with a very mild form of sleep apnea but nonetheless accentuated in REM sleep. There was no hypoxemia. It was due to the high degree of daytime sleepiness but I suggested she should try CPAP. CPAP has reduced her mild apnea completely at his AHI of 0.5, it will have taken care of her snoring but the interface was not comfortable.  We could offer a dental device would there be the REM accentuation.   Remains EDS- question of narcolepsy , naps frequently and any time.  Narcolepsy is a possible diagnosis.  Naps are unusually long for a narcoleptic, 2 hours. Sleep paralysis.    Autoimmune disorders - she had vitiligo, geographical tongue, may contribute ot higher fatigue.   If she is successfully reducing her body mass index she probably will not need CPAP much longer. I will send her to her DME was a question of giving her an alternative interface, I suggested a dream wear or a wisp. She is on prednisone for month right now.    Plan:  Treatment plan and additional workup :   Narcolepsy HLA test.   No antidepressants - prednisone will be completed by 14 days from now. MSLT scheduled.   Asencion Partridge Ace Bergfeld MD  12/25/2017   CC: Mackenzie Meyers, [place], [place] [address]

## 2017-12-26 ENCOUNTER — Telehealth: Payer: Self-pay | Admitting: Sports Medicine

## 2017-12-26 NOTE — Telephone Encounter (Signed)
See note

## 2017-12-26 NOTE — Telephone Encounter (Signed)
Called and spoke to pt.  Her surgery date has been moved up to Tuesday, May 7th.  She will be having surgery w/ Dr. Katherine Roan at [facility].  I have cancelled her appt for May 21st and informed her that Dr. Paulla Fore would be happy to see her back for any new issues that arise.  I also informed that she is free to update [place] on her progress following her surgery this Tuesday as she would like.

## 2017-12-26 NOTE — Telephone Encounter (Signed)
Dr Rigby, please advise.  

## 2017-12-26 NOTE — Telephone Encounter (Signed)
Okay to cancel f/u appointment. I agree with her being managed by a neurosurgeon at this time and defer further management to them. I'm happy to see her back for any other issues at any time

## 2017-12-26 NOTE — Telephone Encounter (Signed)
Copied from [place] [id number]. Topic: Quick Communication - See Telephone Encounter >> Dec 26, 2017 11:26 AM Aurelio Brash B wrote: CRM for notification. See Telephone encounter for: 12/26/17.PT wants Dr Paulla Fore to know she has changed neurosurgeons and is having surgery on the 9th and does he still need to see her on the 21, also  does he need her to keep him updated or is he done with her.

## 2017-12-26 NOTE — Telephone Encounter (Deleted)
Copied from [place] [id number]. Topic: Quick Communication - See Telephone Encounter >> Dec 26, 2017 11:22 AM Aurelio Brash B wrote: CRM for notification. See Telephone encounter for: 12/26/17. PT wants Dr Paulla Fore to know she has changed neurosurgeons and is having surgery on the 9th and does he still need to see her on the 21, also  does he need her to keep him updated or is he done with her.

## 2017-12-30 LAB — NARCOLEPSY EVALUATION
DQA1*01:02: POSITIVE
DQB1*06:02: POSITIVE

## 2017-12-31 ENCOUNTER — Telehealth: Payer: Self-pay | Admitting: Neurology

## 2017-12-31 NOTE — Telephone Encounter (Signed)
-----  Message from Larey Seat, MD sent at 12/31/2017  9:12 AM EDT ----- Positive HLA

## 2017-12-31 NOTE — Telephone Encounter (Signed)
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 Pt verbalized understanding.  She will come in for the definitive diagnostic testing. She just had surgery yesterday and is on pain medication and muscle relaxer's. I have informed her that she would be unable to complete the testing while taking these medications. Pt states that would mean at least 6 weeks. Informed her that when the sleep lab calls to get her schedule just make them aware of that and inform them when to schedule. Pt verbalized understanding.

## 2018-01-01 ENCOUNTER — Encounter: Payer: Self-pay | Admitting: Sports Medicine

## 2018-01-05 NOTE — Progress Notes (Signed)
I reviewed note and agree with plan.   Philena Obey R. Zaydee Aina, MD  Certified in Neurology, Neurophysiology and Neuroimaging  [facility] [address], [address] [place], [place] [address] [phone]   

## 2018-01-13 ENCOUNTER — Ambulatory Visit: Payer: PRIVATE HEALTH INSURANCE | Admitting: Sports Medicine

## 2018-01-16 ENCOUNTER — Ambulatory Visit (INDEPENDENT_AMBULATORY_CARE_PROVIDER_SITE_OTHER): Payer: PRIVATE HEALTH INSURANCE

## 2018-01-16 DIAGNOSIS — E538 Deficiency of other specified B group vitamins: Secondary | ICD-10-CM | POA: Diagnosis not present

## 2018-01-16 MED ORDER — CYANOCOBALAMIN 1000 MCG/ML IJ SOLN
1000.0000 ug | Freq: Once | INTRAMUSCULAR | Status: AC
  Administered 2018-01-16: 1000 ug via INTRAMUSCULAR

## 2018-01-16 NOTE — Progress Notes (Signed)
Patient received vitamin B12 1000 mcg in right deltoid.  Tolerated without difficulty.  Will schedule another vitamin B12 injection in 1 week.

## 2018-01-21 ENCOUNTER — Telehealth: Payer: Self-pay | Admitting: Physician Assistant

## 2018-01-21 ENCOUNTER — Other Ambulatory Visit: Payer: Self-pay | Admitting: Physician Assistant

## 2018-01-21 ENCOUNTER — Encounter: Payer: Self-pay | Admitting: Physician Assistant

## 2018-01-21 MED ORDER — RIZATRIPTAN BENZOATE 10 MG PO TBDP: 10.0000 mg | ORAL_TABLET | ORAL | 0 refills | Status: DC | PRN

## 2018-01-21 NOTE — Telephone Encounter (Signed)
Noted.  Copied from [place] [id number]. Topic: General - Other >> Jan 16, 2018 11:20 AM Pricilla Handler wrote: Reason for CRM: Patient called wanting Inda Coke to know that she had to have emergency surgery two weeks ago on her back.       Thank You!!!

## 2018-01-22 ENCOUNTER — Ambulatory Visit
Admission: RE | Admit: 2018-01-22 | Discharge: 2018-01-22 | Disposition: A | Discharge status: Home / Self Care | Payer: PRIVATE HEALTH INSURANCE | Source: Ambulatory Visit | Attending: Gynecology | Admitting: Gynecology

## 2018-01-22 DIAGNOSIS — N63 Unspecified lump in unspecified breast: Secondary | ICD-10-CM

## 2018-01-23 ENCOUNTER — Ambulatory Visit (INDEPENDENT_AMBULATORY_CARE_PROVIDER_SITE_OTHER): Payer: PRIVATE HEALTH INSURANCE | Admitting: *Deleted

## 2018-01-23 ENCOUNTER — Ambulatory Visit: Payer: PRIVATE HEALTH INSURANCE

## 2018-01-23 DIAGNOSIS — E538 Deficiency of other specified B group vitamins: Secondary | ICD-10-CM | POA: Diagnosis not present

## 2018-01-23 MED ORDER — CYANOCOBALAMIN 1000 MCG/ML IJ SOLN
1000.0000 ug | Freq: Once | INTRAMUSCULAR | Status: AC
  Administered 2018-01-23: 1000 ug via INTRAMUSCULAR

## 2018-01-23 NOTE — Progress Notes (Signed)
Per orders of Inda Coke, PA-C, injection of Vitamin B12 1063mcg given in Left Deltoid by Anselmo Pickler, LPN. Patient tolerated injection well. Pt knows to return in one week for next injection.

## 2018-01-27 ENCOUNTER — Ambulatory Visit
Admission: RE | Admit: 2018-01-27 | Discharge: 2018-01-27 | Disposition: A | Discharge status: Home / Self Care | Payer: PRIVATE HEALTH INSURANCE | Source: Ambulatory Visit | Attending: Physician Assistant | Admitting: Physician Assistant

## 2018-01-27 DIAGNOSIS — R2230 Localized swelling, mass and lump, unspecified upper limb: Secondary | ICD-10-CM

## 2018-01-30 ENCOUNTER — Ambulatory Visit (INDEPENDENT_AMBULATORY_CARE_PROVIDER_SITE_OTHER): Payer: PRIVATE HEALTH INSURANCE | Admitting: *Deleted

## 2018-01-30 DIAGNOSIS — E538 Deficiency of other specified B group vitamins: Secondary | ICD-10-CM | POA: Diagnosis not present

## 2018-01-30 MED ORDER — CYANOCOBALAMIN 1000 MCG/ML IJ SOLN
1000.0000 ug | Freq: Once | INTRAMUSCULAR | Status: AC
  Administered 2018-01-30: 1000 ug via INTRAMUSCULAR

## 2018-01-30 NOTE — Progress Notes (Addendum)
Per orders of Inda Coke, PA-C, injection of Vitamin B12 1000 mcg given in Right Deltoid by Anselmo Pickler, LPN Patient tolerated injection well.  Pt knows to return on one week for next injection.   I agree with treatment.  Inda Coke PA-C

## 2018-02-03 ENCOUNTER — Ambulatory Visit (INDEPENDENT_AMBULATORY_CARE_PROVIDER_SITE_OTHER): Payer: PRIVATE HEALTH INSURANCE | Admitting: Physician Assistant

## 2018-02-03 ENCOUNTER — Encounter: Payer: Self-pay | Admitting: Physician Assistant

## 2018-02-03 VITALS — BP 102/70 | HR 79 | Temp 98.7°F | Ht 64.0 in | Wt 205.5 lb

## 2018-02-03 DIAGNOSIS — Z713 Dietary counseling and surveillance: Secondary | ICD-10-CM | POA: Diagnosis not present

## 2018-02-03 DIAGNOSIS — E669 Obesity, unspecified: Secondary | ICD-10-CM

## 2018-02-03 NOTE — Patient Instructions (Addendum)
It was great to see you!  I recommend downloading the MyFitnessPal app to track your calories and eating patterns. Track all foods, snacks, beverages.  Websites I like for meal ideas: Pinch Of Yum Budget Bytes Cookie and Anda Kraft  Work on filling up on [employer] first, then protein, then carbohydrates. For carbohydrates, focus on whole wheat products.  Once you get started with PT, work on getting regular exercise per their discretion.

## 2018-02-03 NOTE — Progress Notes (Signed)
Mackenzie Meyers is a 47 y.o. female here to discuss weight loss.  I acted as a Education administrator for [employer], [place] Pickler, LPN  History of Present Illness:   Chief Complaint  Patient presents with  . discuss weight loss    HPI  Patient is here to discuss weight loss and diet. She would like to lose 30 lb, would like to be at 175 lb.   Dietary recall: Breakfast -- coffee, but often no food Lunch -- 11am -- [place] sandwich on honey wheat bread (or tortilla) Dinner -- 6pm --  Various things Snacks -- handful of almonds Beverages  -- unsweetened tea, coffee, water  Weight watchers worked for her in the past, cannot afford now.  Weight: Wt Readings from Last 3 Encounters:  02/03/18 205 lb 8 oz (93.2 kg)  12/25/17 200 lb (90.7 kg)  12/23/17 197 lb (89.4 kg)    Exercise: Starting PT this week, for her back issues  Support system: Husband and family  Sleep: Currently undergoing narcolepsy evaluation  Goals: 1- Lose 30 lb 2- Be more active 3- Eat healthier  Estimated daily energy needs: Calories: 1500 - 1750 kcal Protein: 75 - 90 g Fluid: 2000 ml  Past Medical History:  Diagnosis Date  . Anxiety    currently managed on CBD oil, was on medication briefly but made her OCD worse  . Arthritis    hands  . Breast mass, right 09/2012   removed because it was painful, was found to be calcified scar tissue  . Cervical dysplasia age 23  . GDM (gestational diabetes mellitus)    with last pregnancy  . Geographical tongue   . 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 History of kidney stones 10/2011  . 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 Hx of migraines   . OCD (obsessive compulsive disorder)    cleanliness with home and workspace  . Rosacea   . Sinus congestion 10/12/2012  . Sleep apnea 04/2017   patient is wearing C-pap  . Tobacco abuse 1989   max was 1.5 packs per [place]  . Vitamin D deficiency 10/2013    Value 29  . Vitiligo      Social History   Socioeconomic History  . Marital status: Married    Spouse name: Not on file  . Number of children: 3  . Years of education: Not on file  . Highest education level: Not on file  Occupational History  . Not on file  Social Needs  . Financial resource strain: Not on file  . Food insecurity:    Worry: Not on file    Inability: Not on file  . Transportation needs:    Medical: Not on file    Non-medical: Not on file  Tobacco Use  . Smoking status: Current Every Erbe Smoker    Packs/Dibartolo: 0.50    Last attempt to quit: 08/17/2012    Years since quitting: 5.4  . Smokeless tobacco: Never Used  . Tobacco comment: restarted 10/17 d/t husband losing job  Substance and Sexual Activity  . Alcohol use: Yes    Comment: rare, twice a year  . Drug use: No  . Sexual activity: Yes    Partners: Male    Birth control/protection: IUD    Comment: Mirena inserted 03-2015-1st intercourse 47 yo-More than 5 partners  Lifestyle  . Physical activity:    Days per week:  Not on file    Minutes per session: Not on file  . Stress: Not on file  Relationships  . Social connections:    Talks on phone: Not on file    Gets together: Not on file    Attends religious service: Not on file    Active member of club or organization: Not on file    Attends meetings of clubs or organizations: Not on file    Relationship status: Not on file  . Intimate partner violence:    Fear of current or ex partner: Not on file    Emotionally abused: Not on file    Physically abused: Not on file    Forced sexual activity: Not on file  Other Topics Concern  . Not on file  Social History Narrative   Homemaker   2 boys (67 and 17)   24.45 year old girl   Does Medieval recreator; now sews costumes    Past Surgical History:  Procedure Laterality Date  . ABDOMINOPLASTY  2008  . BREAST BIOPSY Right 10/15/2012   Procedure: BREAST BIOPSY;  Surgeon: Haywood Lasso, MD;   Location: [facility];  Service: General;  Laterality: Right;  removal right breast mass  . BREAST EXCISIONAL BIOPSY Right   . BREAST SURGERY     Lift  . CESAREAN SECTION  2004, 2007  . CESAREAN SECTION N/A 02/07/2015   Procedure: CESAREAN SECTION;  Surgeon: Jerelyn Charles, MD;  Location: [facility];  Service: Obstetrics;  Laterality: N/A;  EDD: 02/13/15   . dialation and cutterage    . GYNECOLOGIC CRYOSURGERY  age 63  . INTRAUTERINE DEVICE INSERTION  03/2015   Mirena at [place]  . REDUCTION MAMMAPLASTY Bilateral   . TONSILLECTOMY  2000    Family History  Problem Relation Age of Onset  . Arthritis Mother   . High Cholesterol Mother   . High blood pressure Mother   . Hearing loss Father   . High Cholesterol Father   . High blood pressure Father   . Cancer Maternal Grandfather        lymphoma  . Cancer Paternal Grandfather        liver  . Alcohol abuse Paternal Grandfather   . Lung cancer Maternal Grandmother        and maternal uncle; small cell   . Arthritis Maternal Grandmother   . Hyperlipidemia Maternal Grandmother   . Hypertension Maternal Grandmother   . Hearing loss Maternal Grandmother   . Cancer Maternal Uncle        Lung cancer  . Hyperlipidemia Other   . Asthma Brother   . Alcohol abuse Paternal Grandmother   . Hypertension Paternal Grandmother   . Colon cancer Neg Hx   . Breast cancer Neg Hx     Allergies  Allergen Reactions  . Ampicillin Hives  . Erythromycin Nausea And Vomiting  . Other Hives    SAU[place]RAUT  . Wine [Alcohol] Hives  . Ciprofloxacin Hives and Rash    Current Medications:   Current Outpatient Medications:  .  acetaminophen (TYLENOL) 500 MG tablet, Take 1,000 mg by mouth every 8 (eight) hours as needed (alternated with motrin)., Disp: , Rfl:  .  Ascorbic Acid (VITAMIN C) 1000 MG tablet, Take 1,000 mg by mouth daily. , Disp: , Rfl:  .  ibuprofen (ADVIL,MOTRIN) 200 MG tablet, Take 800 mg by mouth every 8 (eight)  hours as needed. Patient takes 4 at a time , Disp: , Rfl:  .  levonorgestrel (MIRENA) 20 MCG/24HR IUD, 1 each by Intrauterine route once. Inserted by GYN on 03/2015 needs to be removed 03/2020., Disp: , Rfl:  .  mometasone (NASONEX) 50 MCG/ACT nasal spray, Place 2 sprays into the nose 2 (two) times daily., Disp: , Rfl:  .  rizatriptan (MAXALT-MLT) 10 MG disintegrating tablet, Take 1 tablet (10 mg total) by mouth as needed for migraine. May repeat in 2 hours if needed, Disp: 10 tablet, Rfl: 0 .  valACYclovir (VALTREX) 500 MG tablet, Take 1 tablet (500 mg total) by mouth daily., Disp: 90 tablet, Rfl: 4 .  lidocaine (LIDODERM) 5 %, Place 1 patch onto the skin daily. Remove & Discard patch within 12 hours or as directed by MD (Patient not taking: Reported on 02/03/2018), Disp: 30 patch, Rfl: 0 .  OVER THE COUNTER MEDICATION, Place 750 mg under the tongue daily. CBD Oil, Disp: , Rfl:    Review of Systems:   ROS  Negative unless otherwise specified per HPI.   Vitals:   Vitals:   02/03/18 1031  BP: 102/70  Pulse: 79  Temp: 98.7 F (37.1 C)  TempSrc: Oral  SpO2: 97%  Weight: 205 lb 8 oz (93.2 kg)  Height: 5\' 4"  (1.626 m)     Body mass index is 35.27 kg/m.  Physical Exam:   Physical Exam  Constitutional: She is oriented to person, place, and time. She appears well-developed and well-nourished.  HENT:  Head: Normocephalic and atraumatic.  Eyes: Conjunctivae and EOM are normal.  Neck: Normal range of motion. Neck supple.  Pulmonary/Chest: Effort normal.  Musculoskeletal: Normal range of motion.  Neurological: She is alert and oriented to person, place, and time.  Skin: Skin is warm and dry.  Psychiatric: She has a normal mood and affect. Her behavior is normal. Judgment and thought content normal.    Assessment and Plan:    Celisse was seen today for discuss weight loss.  Diagnoses and all orders for this visit:  Obesity, unspecified classification, unspecified obesity type,  unspecified whether serious comorbidity present  Encounter for nutritional counseling   We reviewed different regimens to incorporate more fruits and vegetables with meals.  I also provided a list of websites for meal ideas.  I provided handouts including a snack list, healthy/balanced plate, and 1600-calorie checklist.  I also strongly recommended MyFitnessPal for her to track her calories as she did very well with weight watchers in the past.  She is also going to start physical therapy this week, plan to follow their discretion regarding her exercise plan.  I recommended that she pursue her narcolepsy evaluation and then afterwards, we could discuss possible medications for weight loss as long as they are not contraindicated with her for possible interventions for nacrolepsy.   . Reviewed expectations re: course of current medical issues. . Discussed self-management of symptoms. . Outlined signs and symptoms indicating need for more acute intervention. . Patient verbalized understanding and all questions were answered. . See orders for this visit as documented in the electronic medical record. . Patient received an After-Visit Summary.  CMA or LPN served as scribe during this visit. History, Physical, and Plan performed by medical provider. Documentation and orders reviewed and attested to.  I spent 25 minutes with this patient, greater than 50% was face-to-face time counseling regarding the above diagnoses.   Inda Coke, PA-C

## 2018-02-06 ENCOUNTER — Ambulatory Visit (INDEPENDENT_AMBULATORY_CARE_PROVIDER_SITE_OTHER): Payer: PRIVATE HEALTH INSURANCE

## 2018-02-06 DIAGNOSIS — E538 Deficiency of other specified B group vitamins: Secondary | ICD-10-CM

## 2018-02-06 MED ORDER — CYANOCOBALAMIN 1000 MCG/ML IJ SOLN
1000.0000 ug | Freq: Once | INTRAMUSCULAR | Status: AC
  Administered 2018-02-06: 1000 ug via INTRAMUSCULAR

## 2018-02-06 NOTE — Progress Notes (Signed)
Patient in today for B12 injection due to B12 deficiency. B12 injection given in left deltoid. Patient tolerated well.

## 2018-02-18 ENCOUNTER — Ambulatory Visit (INDEPENDENT_AMBULATORY_CARE_PROVIDER_SITE_OTHER): Payer: PRIVATE HEALTH INSURANCE | Admitting: Neurology

## 2018-02-18 DIAGNOSIS — G4710 Hypersomnia, unspecified: Secondary | ICD-10-CM

## 2018-02-18 DIAGNOSIS — G4719 Other hypersomnia: Secondary | ICD-10-CM

## 2018-02-18 DIAGNOSIS — Z9989 Dependence on other enabling machines and devices: Principal | ICD-10-CM

## 2018-02-18 DIAGNOSIS — G4733 Obstructive sleep apnea (adult) (pediatric): Secondary | ICD-10-CM | POA: Diagnosis not present

## 2018-02-19 ENCOUNTER — Ambulatory Visit (INDEPENDENT_AMBULATORY_CARE_PROVIDER_SITE_OTHER): Payer: PRIVATE HEALTH INSURANCE | Admitting: Neurology

## 2018-02-19 DIAGNOSIS — G4719 Other hypersomnia: Secondary | ICD-10-CM

## 2018-02-19 DIAGNOSIS — G47419 Narcolepsy without cataplexy: Secondary | ICD-10-CM

## 2018-02-19 DIAGNOSIS — G4733 Obstructive sleep apnea (adult) (pediatric): Secondary | ICD-10-CM

## 2018-02-19 DIAGNOSIS — Z9989 Dependence on other enabling machines and devices: Secondary | ICD-10-CM

## 2018-02-19 DIAGNOSIS — G4710 Hypersomnia, unspecified: Secondary | ICD-10-CM

## 2018-02-19 NOTE — Addendum Note (Signed)
Addended by: Inis Sizer D on: 02/19/2018 03:20 PM   Modules accepted: Orders

## 2018-02-19 NOTE — Addendum Note (Signed)
Addended by: Lester Goessel A on: 02/19/2018 01:48 PM   Modules accepted: Orders

## 2018-02-25 LAB — COMPREHENSIVE DRUG ANALYSIS,UR

## 2018-03-12 NOTE — Procedures (Signed)
  Name:  Mackenzie Meyers, Mackenzie Meyers. Reference [id number]  Study Date: 02/19/2018 Procedure #: 2200  DOB: 1970/12/29    Protocol  This is a 13 channel Multiple Sleep Latency Test comprised of 5 channels of EEG (T3-Cz, Cz-T4, F4-M1, C4-M1, O2-M1), 3 channels of Chin EMG, 4 channels of EOG and 1 channel for ECG.   All channels were sampled at 256hz.    This polysomnographic procedure is designed to evaluate (1) the complaint of excessive daytime sleepiness by quantifying the time required to fall asleep and (2) the possibility of narcolepsy by checking for abnormally short latencies to REM sleep.  Electrographic variables include EEG, EMG, EOG and ECG.  Patients are monitored throughout four or five 20-minute opportunities to sleep (naps) at two-hour intervals.  For each nap, the patient is allowed 20 minutes to fall asleep.  Once asleep, the patient is awakened after 15 minutes.  Between naps, the patient is kept as alert as possible.  A sleep latency of 20 minutes indicates that no sleep occurred.  Parametric Analysis  Total Number of Naps 5     NAP # Time of Nap  Sleep Latency (mins) REM Latency (mins) Sleep Time Percent Awake Time Percent  1 08:01 17 0 45 55   2 09:52 10 0 59  41   3 11:58 7.5 0 65  35   4 14:00 8 0 64  36   5 15:56 9.5 0 58  42    MSLT Summary of Naps  Sleepiness Index: 48  Mean Sleep Latency to all Five Naps: 10.4  Mean Sleep Latency to First Four Naps: 10.6  Mean Sleep Latency to First Three Naps: 11.5  Mean Sleep Latency to First Two Naps: 13.5  Number of Naps with REM Sleep: 0    Results from Preceding PSG Study  Sleep Onset Time 22.26 Sleep Efficiency (%) 85%  Rise Time 06.31 Sleep Latency (min) 66  Total Sleep Time (min) 468.5 REM Latency (min) 96    I attest to having reviewed every epoch of the entire raw data recording prior to the issuance of this report in accordance with the Standards of the American Academy of Sleep Medicine.      Name:  Mackenzie Meyers, Mackenzie Meyers. Reference #:  [id number]  Study Date: 02/19/2018 DOB: Nov 02, 1970    IMPRESSION:  1. This multiple sleep latency test reveals a mean sleep latency of 10.4 minutes with 0 sleep periods during which REM sleep was recorded.  A total of 5 sleep periods were recorded.   2. This study was preceded by an overnight polysomnogram with a total sleep time (TST) of 468.5 minutes.   3. Toxicology testing is obtained for all MSLT patients.      RECOMMENDATIONS: This MSLT study is abnormal due to the short mean sleep latency. 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 This study, in the proper clinical scenario, may still be consistent with a diagnosis of narcolepsy. Please correlate to HLA test.     Larey Seat, M.D.  03-12-2018 Diplomat, American Board of Psychiatry and Neurology  Diplomat, [place] of Sleep Medicine Medical Director, [place] [facility] at [facility]

## 2018-03-12 NOTE — Addendum Note (Signed)
Addended by: Larey Seat on: 03/12/2018 06:36 PM   Modules accepted: Orders

## 2018-03-12 NOTE — Procedures (Signed)
PATIENT'S NAME:  Mackenzie Meyers, Mackenzie Meyers DOB:      12-Oct-1970      MR#:    [id number]     DATE OF RECORDING: 02/18/2018 REFERRING M.D.:  Sharion Balloon, FNP Study Performed:   CPAP  Titration/ PSG for MSLT HISTORY:  Pt here for CPAP prior to MSLT study. Baseline PSG 11-15-2016 had confirmed mild OSA, patient was CPAP titrated to 6 cm CPAP and returns now on CPAP therapy with persistent symptoms of excessive daytime sleepiness.  The patient endorsed the Epworth Sleepiness Scale at 16/24 points.   The patient's weight 201 pounds with a height of 64 (inches), resulting in a BMI of 34.3 kg/m2. The patient's neck circumference measured 12.5 inches.  CURRENT MEDICATIONS: Tylenol, Motrin, Mirena IUD, Valtrex   PROCEDURE:  This is a multichannel digital polysomnogram utilizing the SomnoStar 11.2 system.  Electrodes and sensors were applied and monitored per AASM Specifications.   EEG, EOG, Chin and Limb EMG, were sampled at 200 Hz.  ECG, Snore and Nasal Pressure, Thermal Airflow, Respiratory Effort, CPAP Flow and Pressure, Oximetry was sampled at 50 Hz. Digital video and audio were recorded.      CPAP was initiated at 5 cmH20 with heated humidity per AASM split night standards and pressure was advanced to 9/9cmH20 because of hypopneas, apneas and desaturations.  At a PAP pressure of 9 cmH20, there was a reduction of the AHI to 0.0 with improvement of sleep apnea. Lights Out was at 21:20 and Lights On at 06:31. Total recording time (TRT) was 551 minutes, with a total sleep time (TST) of 468.5 minutes. The patient's sleep latency was 66 minutes. REM latency was 96 minutes.  The sleep efficiency was 85.2 %.    SLEEP ARCHITECTURE: WASO (Wake after sleep onset) was 16 minutes.  There were 21 minutes in Stage N1, 159.5 minutes Stage N2, 173 minutes Stage N3 and 115 minutes in Stage REM.  The percentage of Stage N1 was 4.5%, Stage N2 was 34.%, Stage N3 was 36.9% and Stage R (REM sleep) was 24.5%. The sleep architecture was  notable for 5 REM sleep cycles. RESPIRATORY ANALYSIS:  There was a total of 5 respiratory events: 1 obstructive apnea, 1 central and 0 mixed apneas with 3 hypopneas. The patient also had 0 respiratory event related arousals (RERAs).     The total APNEA/HYPOPNEA INDEX (AHI) was 0.6 /hour and the total RESPIRATORY DISTURBANCE INDEX was 0.6/hour  4 events occurred in REM sleep and 1 event in NREM. The REM AHI was 2.1 /hour versus a non-REM AHI of 0.2 /hour.  The patient spent 273 minutes of total sleep time in the supine position and 196 minutes in non-supine. The supine AHI was 0.9, versus a non-supine AHI of 0.3.  OXYGEN SATURATION & C02:  The baseline 02 saturation was 98%, with the lowest being 80%. Time spent below 89% saturation equaled 24 minutes.  PERIODIC LIMB MOVEMENTS:  The patient had a total of 0 Periodic Limb Movements. The arousals were noted as: 54 were spontaneous, 0 were associated with PLMs, and 0 were associated with respiratory events.       Audio and video analysis did not show any abnormal or unusual movements, behaviors, phonations or vocalizations.   EKG was in keeping with normal sinus rhythm (NSR).  Post-study, the patient indicated that sleep was the same as usual.  The patient used her own Respironics DreamWear medium sized mask.  DIAGNOSIS 1. Obstructive Sleep Apnea well controlled on CPAP of 9  cm water. 2. No evidence of early REM sleep onset. 3. Valid study for MSLT to follow.  A follow up appointment will be scheduled in the Sleep Clinic at [facility].   Please call [phone] with any questions.      I certify that I have reviewed the entire raw data recording prior to the issuance of this report in accordance with the Standards of Accreditation of the American Academy of Sleep Medicine (AASM)      Larey Seat, M.D.   03-12-2018 Diplomat, American Board of Psychiatry and Neurology  Diplomat, [place] of Sleep  Medicine Medical Director, [place] [facility] at [facility]

## 2018-03-16 ENCOUNTER — Other Ambulatory Visit: Payer: Self-pay | Admitting: Neurology

## 2018-03-16 ENCOUNTER — Telehealth: Payer: Self-pay | Admitting: Neurology

## 2018-03-16 MED ORDER — MODAFINIL 200 MG PO TABS: ORAL_TABLET | ORAL | 5 refills | Status: DC

## 2018-03-16 NOTE — Telephone Encounter (Signed)
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  Modafinil medication would be approved for OSA patient with residual sleepiness, as are stimulant medications.

## 2018-03-16 NOTE — Telephone Encounter (Signed)
Called and reviewed the patient's sleep study and daytime study with the pt. I informed her that we had to increase the pressure on the CPAP to help with treating the apnea completely. Dr Brett Fairy would like to increase the pressure to 9 cm of water. I advised her that the daytime study did not indicate a diagnosis of narcolepsy. 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 She is recommending a medication called modafinil to attempt to try and see if this works with helping the patient. Advised the patient to avoid caffeine intake when taking this medication. Pt verbalized understanding. She is willing to try the medication.

## 2018-03-17 ENCOUNTER — Telehealth: Payer: Self-pay | Admitting: Neurology

## 2018-03-17 NOTE — Telephone Encounter (Signed)
PA submitted through cover my meds through [employer] for Modafinil.  KEY: [id number] Will wait for a response

## 2018-03-17 NOTE — Telephone Encounter (Signed)
Approved through [employer] [facility] until 09/17/2018.

## 2018-03-30 ENCOUNTER — Ambulatory Visit (INDEPENDENT_AMBULATORY_CARE_PROVIDER_SITE_OTHER): Payer: PRIVATE HEALTH INSURANCE | Admitting: Physician Assistant

## 2018-03-30 ENCOUNTER — Encounter: Payer: Self-pay | Admitting: Physician Assistant

## 2018-03-30 VITALS — BP 110/70 | HR 79 | Temp 98.5°F | Ht 64.0 in | Wt 204.5 lb

## 2018-03-30 DIAGNOSIS — J309 Allergic rhinitis, unspecified: Secondary | ICD-10-CM | POA: Diagnosis not present

## 2018-03-30 DIAGNOSIS — T7840XA Allergy, unspecified, initial encounter: Secondary | ICD-10-CM | POA: Diagnosis not present

## 2018-03-30 DIAGNOSIS — K219 Gastro-esophageal reflux disease without esophagitis: Secondary | ICD-10-CM | POA: Diagnosis not present

## 2018-03-30 MED ORDER — FEXOFENADINE HCL 180 MG PO TABS: 180.0000 mg | ORAL_TABLET | Freq: Every day | ORAL | 1 refills | Status: DC

## 2018-03-30 MED ORDER — RANITIDINE HCL 150 MG PO TABS: 150.0000 mg | ORAL_TABLET | Freq: Every day | ORAL | 1 refills | Status: DC

## 2018-03-30 NOTE — Progress Notes (Signed)
Mackenzie Meyers is a 47 y.o. female here for a new problem.  I acted as a Education administrator for [employer], PA-C Anselmo Pickler, LPN  History of Present Illness:   Chief Complaint  Patient presents with  . Sinus Problem    Sinus Problem  This is a new problem. Episode onset: Started last week Monday. The problem has been gradually worsening since onset. There has been no fever. Her pain is at a severity of 1/10. The pain is mild. Associated symptoms include congestion (Nasal), headaches and sinus pressure. Pertinent negatives include no chills, coughing, ear pain, hoarse voice, neck pain or sore throat. Past treatments include spray decongestants (Flonase, Mucinex Sinus Max). The treatment provided mild relief.   She has tried Claritin in the past without relief.  Mucinex sinus max nasal spray was helpful, but she knows that she is only supposed to use that for 3 days.  She is interested in allergy testing.  She recently started on modafinil for her, and she was told by her pharmacist that she cannot take any decongestants while on this medication.  She also endorses worsening heartburn.  She states that tomato products are specifically a trigger for her.  She has been taking as needed zantac with relief of symptoms. Denies abdominal pain, changes in weight.  Past Medical History:  Diagnosis Date  . Anxiety    currently managed on CBD oil, was on medication briefly but made her OCD worse  . Arthritis    hands  . Breast mass, right 09/2012   removed because it was painful, was found to be calcified scar tissue  . Cervical dysplasia age 38  . GDM (gestational diabetes mellitus)    with last pregnancy  . Geographical tongue   . 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 History of kidney stones 10/2011  . 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 Hx of migraines   . OCD (obsessive compulsive disorder)    cleanliness with home and  workspace  . Rosacea   . Sinus congestion 10/12/2012  . Sleep apnea 04/2017   patient is wearing C-pap  . Tobacco abuse 1989   max was 1.5 packs per [facility]  . Vitamin D deficiency 10/2013   Value 29  . Vitiligo      Social History   Socioeconomic History  . Marital status: Married    Spouse name: Not on file  . Number of children: 3  . Years of education: Not on file  . Highest education level: Not on file  Occupational History  . Not on file  Social Needs  . Financial resource strain: Not on file  . Food insecurity:    Worry: Not on file    Inability: Not on file  . Transportation needs:    Medical: Not on file    Non-medical: Not on file  Tobacco Use  . Smoking status: Current Every Nijjar Smoker    Packs/Westra: 0.50    Last attempt to quit: 08/17/2012    Years since quitting: 5.6  . Smokeless tobacco: Never Used  . Tobacco comment: restarted 10/17 d/t husband losing job  Substance and Sexual Activity  . Alcohol use: Yes    Comment: rare, twice a year  . Drug use: No  . Sexual activity: Yes    Partners: Male    Birth control/protection: IUD    Comment: Mirena inserted 03-2015-1st intercourse 47 yo-More than  5 partners  Lifestyle  . Physical activity:    Days per week: Not on file    Minutes per session: Not on file  . Stress: Not on file  Relationships  . Social connections:    Talks on phone: Not on file    Gets together: Not on file    Attends religious service: Not on file    Active member of club or organization: Not on file    Attends meetings of clubs or organizations: Not on file    Relationship status: Not on file  . Intimate partner violence:    Fear of current or ex partner: Not on file    Emotionally abused: Not on file    Physically abused: Not on file    Forced sexual activity: Not on file  Other Topics Concern  . 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 ABDOMINOPLASTY  2008  . BREAST BIOPSY Right 10/15/2012   Procedure: BREAST BIOPSY;  Surgeon: Haywood Lasso, MD;  Location: [facility];  Service: General;  Laterality: Right;  removal right breast mass  . BREAST EXCISIONAL BIOPSY Right   . BREAST SURGERY     Lift  . CESAREAN SECTION  2004, 2007  . CESAREAN SECTION N/A 02/07/2015   Procedure: CESAREAN SECTION;  Surgeon: Jerelyn Charles, MD;  Location: [facility];  Service: Obstetrics;  Laterality: N/A;  EDD: 02/13/15   . dialation and cutterage    . GYNECOLOGIC CRYOSURGERY  age 19  . INTRAUTERINE DEVICE INSERTION  03/2015   Mirena at [facility]  . REDUCTION MAMMAPLASTY Bilateral   . TONSILLECTOMY  2000    Family History  Problem Relation Age of Onset  . Arthritis Mother   . High Cholesterol Mother   . High blood pressure Mother   . Hearing loss Father   . High Cholesterol Father   . High blood pressure Father   . Cancer Maternal Grandfather        lymphoma  . Cancer Paternal Grandfather        liver  . Alcohol abuse Paternal Grandfather   . Lung cancer Maternal Grandmother        and maternal uncle; small cell   . Arthritis Maternal Grandmother   . Hyperlipidemia Maternal Grandmother   . Hypertension Maternal Grandmother   . Hearing loss Maternal Grandmother   . Cancer Maternal Uncle        Lung cancer  . Hyperlipidemia Other   . Asthma Brother   . Alcohol abuse Paternal Grandmother   . Hypertension Paternal Grandmother   . Colon cancer Neg Hx   . Breast cancer Neg Hx     Allergies  Allergen Reactions  . Ampicillin Hives  . Erythromycin Nausea And Vomiting  . Other Hives    SAUERKRAUT  . Wine [Alcohol] Hives  . Ciprofloxacin Hives and Rash    Current Medications:   Current Outpatient Medications:  .  acetaminophen (TYLENOL) 500 MG tablet, Take 1,000 mg by mouth every 8 (eight) hours as needed (alternated with motrin).,  Disp: , Rfl:  .  Ascorbic Acid (VITAMIN C) 1000 MG tablet, Take 1,000 mg by mouth daily. , Disp: , Rfl:  .  fluticasone (FLONASE) 50 MCG/ACT nasal spray, Place 2 sprays into both nostrils daily  as needed for allergies or rhinitis., Disp: , Rfl:  .  ibuprofen (ADVIL,MOTRIN) 200 MG tablet, Take 800 mg by mouth every 8 (eight) hours as needed. Patient takes 4 at a time , Disp: , Rfl:  .  levonorgestrel (MIRENA) 20 MCG/24HR IUD, 1 each by Intrauterine route once. Inserted by GYN on 03/2015 needs to be removed 03/2020., Disp: , Rfl:  .  modafinil (PROVIGIL) 200 MG tablet, Start out by taking 1/2 tablet daily in the morning. May increase to a full tablet daily if need to., Disp: 30 tablet, Rfl: 5 .  OVER THE COUNTER MEDICATION, Place 750 mg under the tongue daily. CBD Oil, Disp: , Rfl:  .  rizatriptan (MAXALT-MLT) 10 MG disintegrating tablet, Take 1 tablet (10 mg total) by mouth as needed for migraine. May repeat in 2 hours if needed, Disp: 10 tablet, Rfl: 0 .  tiZANidine (ZANAFLEX) 4 MG tablet, Take 4 mg by mouth every 6 (six) hours as needed for muscle spasms., Disp: , Rfl:  .  valACYclovir (VALTREX) 500 MG tablet, Take 1 tablet (500 mg total) by mouth daily., Disp: 90 tablet, Rfl: 4 .  fexofenadine (ALLEGRA) 180 MG tablet, Take 1 tablet (180 mg total) by mouth daily., Disp: 30 tablet, Rfl: 1 .  ranitidine (ZANTAC) 150 MG tablet, Take 1 tablet (150 mg total) by mouth daily at 12 noon., Disp: 30 tablet, Rfl: 1   Review of Systems:   Review of Systems  Constitutional: Negative for chills.  HENT: Positive for congestion (Nasal) and sinus pressure. Negative for ear pain, hoarse voice and sore throat.   Respiratory: Negative for cough.   Musculoskeletal: Negative for neck pain.  Neurological: Positive for headaches.    Vitals:   Vitals:   03/30/18 0929  BP: 110/70  Pulse: 79  Temp: 98.5 F (36.9 C)  TempSrc: Oral  SpO2: 97%  Weight: 204 lb 8 oz (92.8 kg)  Height: 5\' 4"  (1.626 m)     Body  mass index is 35.1 kg/m.  Physical Exam:   Physical Exam  Constitutional: She appears well-developed. She is cooperative.  Non-toxic appearance. She does not have a sickly appearance. She does not appear ill. No distress.  HENT:  Head: Normocephalic and atraumatic.  Right Ear: Tympanic membrane, external ear and ear canal normal. Tympanic membrane is not erythematous, not retracted and not bulging.  Left Ear: Tympanic membrane, external ear and ear canal normal. Tympanic membrane is not erythematous, not retracted and not bulging.  Nose: Right sinus exhibits maxillary sinus tenderness (mild). Right sinus exhibits no frontal sinus tenderness. Left sinus exhibits maxillary sinus tenderness (mild). Left sinus exhibits no frontal sinus tenderness.  Mouth/Throat: Uvula is midline. No posterior oropharyngeal edema or posterior oropharyngeal erythema.  Eyes: Conjunctivae and lids are normal.  Neck: Trachea normal.  Cardiovascular: Normal rate, regular rhythm, S1 normal, S2 normal and normal heart sounds.  Pulmonary/Chest: Effort normal and breath sounds normal. She has no decreased breath sounds. She has no wheezes. She has no rhonchi. She has no rales.  Lymphadenopathy:    She has no cervical adenopathy.  Neurological: She is alert.  Skin: Skin is warm, dry and intact.  Psychiatric: She has a normal mood and affect. Her speech is normal and behavior is normal.  Nursing note and vitals reviewed.   Assessment and Plan:    Mackenzie Meyers was seen today for sinus problem.  Diagnoses and all orders for this visit:  Allergic rhinitis, unspecified seasonality, unspecified trigger; Allergic state Discussed trying  regular Allegra (without decongestant) -- will fill for patient. Consider Neti-Pot or sinus rinse. Follow-up with Allergist, or if indicated, we can refer to ENT if needed. -     Ambulatory referral to Allergy  GERD Refill Zantac and discussed taking regularly if needed, follow-up if symptoms  persist despite treatment.  Other orders -     fexofenadine (ALLEGRA) 180 MG tablet; Take 1 tablet (180 mg total) by mouth daily. -     ranitidine (ZANTAC) 150 MG tablet; Take 1 tablet (150 mg total) by mouth daily at 12 noon.  . Reviewed expectations re: course of current medical issues. . Discussed self-management of symptoms. . Outlined signs and symptoms indicating need for more acute intervention. . Patient verbalized understanding and all questions were answered. . See orders for this visit as documented in the electronic medical record. . Patient received an After-Visit Summary.  CMA or LPN served as scribe during this visit. History, Physical, and Plan performed by medical provider. Documentation and orders reviewed and attested to.   Inda Coke, PA-C

## 2018-04-09 ENCOUNTER — Telehealth: Payer: Self-pay | Admitting: Neurology

## 2018-04-09 MED ORDER — ARMODAFINIL 250 MG PO TABS: 250.0000 mg | ORAL_TABLET | Freq: Every day | ORAL | 2 refills | Status: DC

## 2018-04-09 NOTE — Telephone Encounter (Signed)
Pt said modafinil (PROVIGIL) 200 MG tablet is not working. Please call to advise

## 2018-04-09 NOTE — Telephone Encounter (Signed)
Yes- that is a good idea, if that fails we can try sunosin- the new wakefulness medication

## 2018-04-09 NOTE — Telephone Encounter (Signed)
Called the patient and made her aware that Dr Brett Fairy recommends trying a another stimulant medication called armodafinil. The dosage is increased a little more to 250 mg and she went ahead and sent it to the pharmacy for her. Pt verbalized understanding.

## 2018-04-13 ENCOUNTER — Telehealth: Payer: Self-pay | Admitting: Neurology

## 2018-04-13 NOTE — Telephone Encounter (Signed)
PA submitted and approved through cover my meds/ [employer]. PA approved until 10/14/18 PA- [id number]

## 2018-04-28 ENCOUNTER — Encounter: Payer: Self-pay | Admitting: Physician Assistant

## 2018-04-29 ENCOUNTER — Ambulatory Visit (INDEPENDENT_AMBULATORY_CARE_PROVIDER_SITE_OTHER): Payer: PRIVATE HEALTH INSURANCE | Admitting: Physician Assistant

## 2018-04-29 ENCOUNTER — Encounter: Payer: Self-pay | Admitting: Physician Assistant

## 2018-04-29 VITALS — BP 108/72 | HR 83 | Temp 98.5°F | Ht 64.0 in | Wt 198.0 lb

## 2018-04-29 DIAGNOSIS — G4711 Idiopathic hypersomnia with long sleep time: Secondary | ICD-10-CM | POA: Diagnosis not present

## 2018-04-29 DIAGNOSIS — E559 Vitamin D deficiency, unspecified: Secondary | ICD-10-CM | POA: Diagnosis not present

## 2018-04-29 DIAGNOSIS — E538 Deficiency of other specified B group vitamins: Secondary | ICD-10-CM | POA: Diagnosis not present

## 2018-04-29 DIAGNOSIS — R5383 Other fatigue: Secondary | ICD-10-CM

## 2018-04-29 NOTE — Patient Instructions (Signed)
It was great to see you!  I am going to contact you after I discuss your labs with Nani Skillern and put the orders in.  Take care,  Inda Coke PA-C

## 2018-04-29 NOTE — Progress Notes (Signed)
Mackenzie Meyers is a 47 y.o. female here for a follow up of a pre-existing problem.   History of Present Illness:   Chief Complaint  Patient presents with  . Follow-up    HPI   She is following up on sleep issues. 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 Was on 100 mg for 1 week and increased to 200 mg after two weeks. Did not have scheduled follow-up with her neurologist to discuss this medication. She called her neurologist to discuss the medication change, and was told that she was unable to be seen because she was busy. Did see her neurosurgeon, Dr. Katherine Roan, who has put in a new referral for a different neurologist. She saw Nani Skillern, PA-C with [facility] and is undergoing a new evaluation for her sleep issues. She is currently on Armodafinil 250 mg.  Needs labs per her new sleep medicine provider --> she was told to have her ferritin levels checked along with B12, Vitamin D levels, consider adrenal testing.    Wt Readings from Last 5 Encounters:  04/29/18 198 lb (89.8 kg)  03/30/18 204 lb 8 oz (92.8 kg)  02/03/18 205 lb 8 oz (93.2 kg)  12/25/17 200 lb (90.7 kg)  12/23/17 197 lb (89.4 kg)   Has been without supplements for about 1 month.    Past Medical History:  Diagnosis Date  . Anxiety    currently managed on CBD oil, was on medication briefly but made her OCD worse  . Arthritis    hands  . Breast mass, right 09/2012   removed because it was painful, was found to be calcified scar tissue  . Cervical dysplasia age 39  . GDM (gestational diabetes mellitus)    with last pregnancy  . Geographical tongue   . 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 History of kidney stones 10/2011  . 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 Hx of migraines   . OCD (obsessive compulsive disorder)    cleanliness with home and workspace  . Rosacea   . Sinus  congestion 10/12/2012  . Sleep apnea 04/2017   patient is wearing C-pap  . Tobacco abuse 1989   max was 1.5 packs per [facility]  . Vitamin D deficiency 10/2013   Value 29  . Vitiligo      Social History   Socioeconomic History  . Marital status: Married    Spouse name: Not on file  . Number of children: 3  . Years of education: Not on file  . Highest education level: Not on file  Occupational History  . Not on file  Social Needs  . Financial resource strain: Not on file  . Food insecurity:    Worry: Not on file    Inability: Not on file  . Transportation needs:    Medical: Not on file    Non-medical: Not on file  Tobacco Use  . Smoking status: Current Every Flewellen Smoker    Packs/Smiddy: 0.50    Last attempt to quit: 08/17/2012    Years since quitting: 5.7  . Smokeless tobacco: Never Used  . Tobacco comment: restarted 10/17 d/t husband losing job  Substance and Sexual Activity  . Alcohol use: Yes    Comment: rare, twice a year  . Drug use: No  . Sexual activity: Yes    Partners: Male  Birth control/protection: IUD    Comment: Mirena inserted 03-2015-1st intercourse 47 yo-More than 5 partners  Lifestyle  . Physical activity:    Days per week: Not on file    Minutes per session: Not on file  . Stress: Not on file  Relationships  . Social connections:    Talks on phone: Not on file    Gets together: Not on file    Attends religious service: Not on file    Active member of club or organization: Not on file    Attends meetings of clubs or organizations: Not on file    Relationship status: Not on file  . Intimate partner violence:    Fear of current or ex partner: Not on file    Emotionally abused: Not on file    Physically abused: Not on file    Forced sexual activity: Not on file  Other Topics Concern  . Not on file  Social History Narrative   Homemaker   2 boys (69 and 11)   70.45 year old girl   Does Medieval recreator; now sews costumes    Past Surgical History:   Procedure Laterality Date  . ABDOMINOPLASTY  2008  . BREAST BIOPSY Right 10/15/2012   Procedure: BREAST BIOPSY;  Surgeon: Haywood Lasso, MD;  Location: [facility];  Service: General;  Laterality: Right;  removal right breast mass  . BREAST EXCISIONAL BIOPSY Right   . BREAST SURGERY     Lift  . CESAREAN SECTION  2004, 2007  . CESAREAN SECTION N/A 02/07/2015   Procedure: CESAREAN SECTION;  Surgeon: Jerelyn Charles, MD;  Location: [facility];  Service: Obstetrics;  Laterality: N/A;  EDD: 02/13/15   . dialation and cutterage    . GYNECOLOGIC CRYOSURGERY  age 60  . INTRAUTERINE DEVICE INSERTION  03/2015   Mirena at [facility]  . REDUCTION MAMMAPLASTY Bilateral   . TONSILLECTOMY  2000    Family History  Problem Relation Age of Onset  . Arthritis Mother   . High Cholesterol Mother   . High blood pressure Mother   . Hearing loss Father   . High Cholesterol Father   . High blood pressure Father   . Cancer Maternal Grandfather        lymphoma  . Cancer Paternal Grandfather        liver  . Alcohol abuse Paternal Grandfather   . Lung cancer Maternal Grandmother        and maternal uncle; small cell   . Arthritis Maternal Grandmother   . Hyperlipidemia Maternal Grandmother   . Hypertension Maternal Grandmother   . Hearing loss Maternal Grandmother   . Cancer Maternal Uncle        Lung cancer  . Hyperlipidemia Other   . Asthma Brother   . Alcohol abuse Paternal Grandmother   . Hypertension Paternal Grandmother   . Colon cancer Neg Hx   . Breast cancer Neg Hx     Allergies  Allergen Reactions  . Ampicillin Hives  . Erythromycin Nausea And Vomiting  . Other Hives    SAUERKRAUT  . Wine [Alcohol] Hives  . Ciprofloxacin Hives and Rash    Current Medications:   Current Outpatient Medications:  .  Armodafinil 250 MG tablet, Take 1 tablet (250 mg total) by mouth daily., Disp: 30 tablet, Rfl: 2 .  ibuprofen (ADVIL,MOTRIN) 200 MG tablet, Take 800 mg by mouth  every 8 (eight) hours as needed. Patient takes 4 at a time , Disp: ,  Rfl:  .  levonorgestrel (MIRENA) 20 MCG/24HR IUD, 1 each by Intrauterine route once. Inserted by GYN on 03/2015 needs to be removed 03/2020., Disp: , Rfl:  .  OVER THE COUNTER MEDICATION, Place 750 mg under the tongue daily. CBD Oil, Disp: , Rfl:  .  ranitidine (ZANTAC) 150 MG tablet, Take 1 tablet (150 mg total) by mouth daily at 12 noon., Disp: 30 tablet, Rfl: 1 .  rizatriptan (MAXALT-MLT) 10 MG disintegrating tablet, Take 1 tablet (10 mg total) by mouth as needed for migraine. May repeat in 2 hours if needed, Disp: 10 tablet, Rfl: 0 .  tiZANidine (ZANAFLEX) 4 MG tablet, Take 4 mg by mouth every 6 (six) hours as needed for muscle spasms., Disp: , Rfl:  .  valACYclovir (VALTREX) 500 MG tablet, Take 1 tablet (500 mg total) by mouth daily., Disp: 90 tablet, Rfl: 4   Review of Systems:   ROS  Vitals:   Vitals:   04/29/18 1407  BP: 108/72  Pulse: 83  Temp: 98.5 F (36.9 C)  TempSrc: Oral  SpO2: 98%  Weight: 198 lb (89.8 kg)  Height: 5\' 4"  (1.626 m)     Body mass index is 33.99 kg/m.  Physical Exam:   Physical Exam  Assessment and Plan:    There are no diagnoses linked to this encounter.  . Reviewed expectations re: course of current medical issues. . Discussed self-management of symptoms. . Outlined signs and symptoms indicating need for more acute intervention. . Patient verbalized understanding and all questions were answered. . See orders for this visit as documented in the electronic medical record. . Patient received an After-Visit Summary.   Inda Coke, PA-C

## 2018-05-07 ENCOUNTER — Encounter: Payer: Self-pay | Admitting: Allergy

## 2018-05-07 ENCOUNTER — Ambulatory Visit (INDEPENDENT_AMBULATORY_CARE_PROVIDER_SITE_OTHER): Payer: PRIVATE HEALTH INSURANCE | Admitting: Allergy

## 2018-05-07 VITALS — BP 112/70 | HR 92 | Temp 98.1°F | Resp 20 | Ht 64.0 in | Wt 198.8 lb

## 2018-05-07 DIAGNOSIS — J3089 Other allergic rhinitis: Secondary | ICD-10-CM

## 2018-05-07 MED ORDER — CARBINOXAMINE MALEATE 6 MG PO TABS: 6.0000 mg | ORAL_TABLET | Freq: Two times a day (BID) | ORAL | 5 refills | Status: DC

## 2018-05-07 MED ORDER — AZELASTINE HCL 0.1 % NA SOLN: 2.0000 | Freq: Two times a day (BID) | NASAL | 5 refills | Status: DC

## 2018-05-07 MED ORDER — FLUTICASONE PROPIONATE 93 MCG/ACT NA EXHU: 2.0000 | INHALANT_SUSPENSION | Freq: Two times a day (BID) | NASAL | 5 refills | Status: DC

## 2018-05-07 NOTE — Patient Instructions (Addendum)
Allergic rhinitis  - Environmental allergy testing today is positive to molds and dust mites  - Allergen avoidance measures discussed and handouts provided  - for nasal drainage/post-nasal drip use Astelin 2 sprays each nostril daily  - for nasal congestion use Xhance Flonase nasal device 2 sprays each nostril twice a Reinheimer.  She was shown the demonstration video for proper use.  This will allow for deeper deposition of the steroid to her sinuses as she has not found much benefit from standard nasal steroid sprays  - As she has not had any effectiveness with the use of the long-acting antihistamines we will have her try RyVent 6 mg twice a Scalici  -We did discuss allergen immunotherapy today including protocol, benefits and risk.  Informational handout provided.  She would like to try medication management first.   She will let us know if you would like to proceed with this option.    Follow-up in 4 to 6 months or sooner if needed.

## 2018-05-07 NOTE — Progress Notes (Signed)
New Patient Note  RE: Mackenzie Meyers MRN: [id number] DOB: 09/06/1970 Date of Office Visit: 05/07/2018  Referring provider: Inda Coke, PA Primary care provider: Inda Coke, [place]  Chief Complaint: sinus congestion  History of present illness: Mackenzie Meyers is a 47 y.o. female presenting today for consultation for allergic rhinitis. She states she has never been to an allergist.  She has a history of hypersomnia with a recent diagnosis of narcolepsy she states she is now on medications to treat this currently on armodafinil (previously she reports being on modafinil).  She states on these medication she has been advised not to take decongestant medications.  Prior to starting on her narcolepsy medications she states she was using advil cold and sinus as her go to medication for sinus congestion.  She states this summer has been the first time she has not been able to use any decongestant medications and that she has been more symptomatic this summer because of it.  She reports symptoms of sinus headaches and congestion.  She states she feels full across her nasal bridge.  She states she gets to the point where she can barely breathe through her nose.  She throat clears a lot due to post-nasal drip.  She also has a lots of sneezing.  Symptoms are year-round.  She denies having recurrent sinus infections.   For her symptoms she states she has tried Zyrtec, Human resources officer, Claritin and Xyzal and that none of these antihistamines worked for her.  She also states she tried Singulair for a period of time and did not notice any difference is that she stopped this as well.  She also has tried Flonase and Nasonex without much nasal congestion relief.  She does not know what to do at this point.  No history of asthma or eczema.  She states when she ate sauerkraut for the first time she developed hives the following [place].  She now avoids saurerkraut.   She states she tolerates ingestion of cole slaw and cabbage  in other forms.   She states she has had reaction to wine about a month after she had a breast lump removed.  After 3 sips she states she had flushing and took benadryl.  She now limits her ingestion of wine.  She does have vitiligo as well as history of anxiety, arthritis.    Review of systems: Review of Systems  Constitutional: Negative for chills, fever and malaise/fatigue.  HENT: Positive for congestion and sinus pain. Negative for ear discharge, ear pain, nosebleeds and sore throat.   Eyes: Negative for pain, discharge and redness.  Respiratory: Negative for cough, shortness of breath and wheezing.   Cardiovascular: Negative for chest pain.  Gastrointestinal: Negative for abdominal pain, constipation, diarrhea, heartburn, nausea and vomiting.  Musculoskeletal: Positive for joint pain.  Skin: Negative for itching and rash.  Neurological: Positive for headaches. Negative for dizziness.    All other systems negative unless noted above in HPI  Past medical history: Past Medical History:  Diagnosis Date  . Anxiety    currently managed on CBD oil, was on medication briefly but made her OCD worse  . Arthritis    hands  . Breast mass, right 09/2012   removed because it was painful, was found to be calcified scar tissue  . Cervical dysplasia age 50  . GDM (gestational diabetes mellitus)    with last pregnancy  . Geographical tongue   . 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 History of kidney stones 10/2011  . 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 Hx of migraines   . OCD (obsessive compulsive disorder)    cleanliness with home and workspace  . Rosacea   . Sinus congestion 10/12/2012  . Sleep apnea 04/2017   patient is wearing C-pap  . Tobacco abuse 1989   max was 1.5 packs per [place]  . Vitamin D deficiency 10/2013   Value 29  . Vitiligo     Past surgical history: Past Surgical History:  Procedure Laterality  Date  . ABDOMINOPLASTY  2008  . BREAST BIOPSY Right 10/15/2012   Procedure: BREAST BIOPSY;  Surgeon: Haywood Lasso, MD;  Location: [facility];  Service: General;  Laterality: Right;  removal right breast mass  . BREAST EXCISIONAL BIOPSY Right   . BREAST SURGERY     Lift  . CESAREAN SECTION  2004, 2007  . CESAREAN SECTION N/A 02/07/2015   Procedure: CESAREAN SECTION;  Surgeon: Jerelyn Charles, MD;  Location: [facility];  Service: Obstetrics;  Laterality: N/A;  EDD: 02/13/15   . dialation and cutterage    . GYNECOLOGIC CRYOSURGERY  age 79  . INTRAUTERINE DEVICE INSERTION  03/2015   Mirena at [facility]  . REDUCTION MAMMAPLASTY Bilateral   . TONSILLECTOMY  2000    Family history:  Family History  Problem Relation Age of Onset  . Arthritis Mother   . High Cholesterol Mother   . High blood pressure Mother   . Hearing loss Father   . High Cholesterol Father   . High blood pressure Father   . Cancer Maternal Grandfather        lymphoma  . Cancer Paternal Grandfather        liver  . Alcohol abuse Paternal Grandfather   . Lung cancer Maternal Grandmother        and maternal uncle; small cell   . Arthritis Maternal Grandmother   . Hyperlipidemia Maternal Grandmother   . Hypertension Maternal Grandmother   . Hearing loss Maternal Grandmother   . Cancer Maternal Uncle        Lung cancer  . Hyperlipidemia Other   . Asthma Brother   . Alcohol abuse Paternal Grandmother   . Hypertension Paternal Grandmother   . Eczema Son   . Colon cancer Neg Hx   . Breast cancer Neg Hx   . Allergic rhinitis Neg Hx   . Angioedema Neg Hx   . Urticaria Neg Hx     Social history: She lives with her family in a home with carpeting with electric heating and central cooling.  There is a dog in the home.  There are cats outside the home.  There is no concern for water damage, mildew or roaches in the home.  She is a Agricultural engineer. Tobacco Use  . Smoking status: Current Every [place]  Smoker    Packs/[place]: 0.50    Last attempt to quit: 08/17/2012    Years since quitting: 5.7  . Smokeless tobacco: Never Used  . Tobacco comment: restarted 10/17 d/t husband losing job    Medication List: Allergies as of 05/07/2018      Reactions   Ampicillin Hives   Erythromycin Nausea And Vomiting   Other Hives   SAUERKRAUT   Wine [alcohol] Hives   Ciprofloxacin Hives, Rash      Medication List        Accurate as of 05/07/18  1:27 PM. Always use your most recent med list.          Armodafinil 250 MG tablet Take 1 tablet (250 mg total) by mouth daily.   ibuprofen 200 MG tablet Commonly known as:  ADVIL,MOTRIN Take 800 mg by mouth every 8 (eight) hours as needed. Patient takes 4 at a time   levonorgestrel 20 MCG/24HR IUD Commonly known as:  MIRENA 1 each by Intrauterine route once. Inserted by GYN on 03/2015 needs to be removed 03/2020.   OVER THE COUNTER MEDICATION Place 750 mg under the tongue daily. CBD Oil   ranitidine 150 MG tablet Commonly known as:  ZANTAC Take 1 tablet (150 mg total) by mouth daily at 12 noon.   rizatriptan 10 MG disintegrating tablet Commonly known as:  MAXALT-MLT Take 1 tablet (10 mg total) by mouth as needed for migraine. May repeat in 2 hours if needed   tiZANidine 4 MG tablet Commonly known as:  ZANAFLEX Take 4 mg by mouth every 6 (six) hours as needed for muscle spasms.   valACYclovir 500 MG tablet Commonly known as:  VALTREX Take 1 tablet (500 mg total) by mouth daily.       Known medication allergies: Allergies  Allergen Reactions  . Ampicillin Hives  . Erythromycin Nausea And Vomiting  . Other Hives    SAUERKRAUT  . Wine [Alcohol] Hives  . Ciprofloxacin Hives and Rash     Physical examination: Blood pressure 112/70, pulse 92, temperature 98.1 F (36.7 C), temperature source Oral, resp. rate 20, height 5\' 4"  (1.626 m), weight 198 lb 12.8 oz (90.2 kg).  General: Alert, interactive, in no acute distress. HEENT:  PERRLA, TMs pearly gray, turbinates moderately edematous with clear discharge, post-pharynx non erythematous positive for mild cobblestoning. Neck: Supple without lymphadenopathy. Lungs: Clear to auscultation without wheezing, rhonchi or rales. {no increased work of breathing. CV: Normal S1, S2 without murmurs. Abdomen: Nondistended, nontender. Skin: There are hypopigmented patches throughout her chest and abdomen, arms, back. Extremities:  No clubbing, cyanosis or edema. Neuro:   Grossly intact.  Diagnositics/Labs:  Allergy testing: Environmental allergy skin prick testing is negative with a positive histamine control. Intradermal testing is positive to mold mix 1, mold mix 4 and mite mix Allergy testing results were read and interpreted by provider, documented by clinical staff.   Assessment and plan: Patient Instructions  Allergic rhinitis  - Environmental allergy testing today is positive to molds and dust mites  - Allergen avoidance measures discussed and handouts provided  - for nasal drainage/post-nasal drip use Astelin 2 sprays each nostril daily  - for nasal congestion use Xhance Flonase nasal device 2 sprays each nostril twice a [place].  She was shown the demonstration video for proper use.  This will allow for deeper deposition of the steroid to her sinuses as she has not found much benefit from standard nasal steroid sprays  - As she has not had any effectiveness with the use of the long-acting antihistamines we will have her try RyVent 6 mg twice a [place]  -We did discuss allergen immunotherapy today including protocol, benefits and risk.  Informational handout provided.  She would like to try medication management first.   She will let us know if you would like to proceed with this option.    Follow-up in 4 to 6 months or sooner if needed.  I appreciate the opportunity to take part in [facility] care. Please do not hesitate to contact me with questions.  Sincerely,  Prudy Feeler, MD Allergy/Immunology [facility]

## 2018-05-11 ENCOUNTER — Other Ambulatory Visit (INDEPENDENT_AMBULATORY_CARE_PROVIDER_SITE_OTHER): Payer: PRIVATE HEALTH INSURANCE

## 2018-05-11 DIAGNOSIS — G4711 Idiopathic hypersomnia with long sleep time: Secondary | ICD-10-CM | POA: Diagnosis not present

## 2018-05-11 DIAGNOSIS — E538 Deficiency of other specified B group vitamins: Secondary | ICD-10-CM | POA: Diagnosis not present

## 2018-05-11 DIAGNOSIS — E559 Vitamin D deficiency, unspecified: Secondary | ICD-10-CM

## 2018-05-11 DIAGNOSIS — R5383 Other fatigue: Secondary | ICD-10-CM

## 2018-05-11 LAB — FERRITIN: Ferritin: 38.4 ng/mL (ref 10.0–291.0)

## 2018-05-11 LAB — VITAMIN D 25 HYDROXY (VIT D DEFICIENCY, FRACTURES): VITD: 20.25 ng/mL — ABNORMAL LOW (ref 30.00–100.00)

## 2018-05-11 LAB — CORTISOL: Cortisol, Plasma: 6.7 ug/dL

## 2018-05-11 LAB — VITAMIN B12: Vitamin B-12: 244 pg/mL (ref 211–911)

## 2018-05-12 ENCOUNTER — Other Ambulatory Visit: Payer: Self-pay | Admitting: Physician Assistant

## 2018-05-12 MED ORDER — VITAMIN D (ERGOCALCIFEROL) 1.25 MG (50000 UNIT) PO CAPS: 50000.0000 [IU] | ORAL_CAPSULE | ORAL | 0 refills | Status: DC

## 2018-05-19 ENCOUNTER — Ambulatory Visit (INDEPENDENT_AMBULATORY_CARE_PROVIDER_SITE_OTHER): Payer: PRIVATE HEALTH INSURANCE | Admitting: Physician Assistant

## 2018-05-19 ENCOUNTER — Encounter: Payer: Self-pay | Admitting: Physician Assistant

## 2018-05-19 VITALS — BP 110/70 | HR 76 | Temp 98.6°F | Ht 64.0 in | Wt 199.5 lb

## 2018-05-19 DIAGNOSIS — Z720 Tobacco use: Secondary | ICD-10-CM | POA: Diagnosis not present

## 2018-05-19 DIAGNOSIS — E538 Deficiency of other specified B group vitamins: Secondary | ICD-10-CM | POA: Diagnosis not present

## 2018-05-19 DIAGNOSIS — R059 Cough, unspecified: Secondary | ICD-10-CM

## 2018-05-19 DIAGNOSIS — R05 Cough: Secondary | ICD-10-CM | POA: Diagnosis not present

## 2018-05-19 MED ORDER — CYANOCOBALAMIN 1000 MCG/ML IJ SOLN
1000.0000 ug | Freq: Once | INTRAMUSCULAR | Status: AC
  Administered 2018-05-19: 1000 ug via INTRAMUSCULAR

## 2018-05-19 MED ORDER — BENZONATATE 100 MG PO CAPS: 100.0000 mg | ORAL_CAPSULE | Freq: Two times a day (BID) | ORAL | 0 refills | Status: DC | PRN

## 2018-05-19 MED ORDER — AZITHROMYCIN 250 MG PO TABS: ORAL_TABLET | ORAL | 0 refills | Status: DC

## 2018-05-19 MED ORDER — PREDNISONE 20 MG PO TABS: 40.0000 mg | ORAL_TABLET | Freq: Every day | ORAL | 0 refills | Status: DC

## 2018-05-19 NOTE — Patient Instructions (Signed)
It was great to see you!  You have a viral upper respiratory infection. Antibiotics are not needed for this.  Viral infections usually take 7-10 days to resolve.  The cough can last a few weeks to go away.  Start the prednisone tomorrow morning. May use tessalon perles as needed for cough. If worsening symptoms, start the azithromycin.  Great job on cutting back on smoking! Keep it up!  Push fluids and get plenty of rest. Please return if you are not improving as expected, or if you have high fevers (>101.5) or difficulty swallowing or worsening productive cough.  Call clinic with questions.  I hope you start feeling better soon!

## 2018-05-19 NOTE — Progress Notes (Signed)
Mackenzie Meyers is a 47 y.o. female here for a new problem.  I acted as a Education administrator for [employer], PA-C Anselmo Pickler, LPN  History of Present Illness:   Chief Complaint  Patient presents with  . Cough    Cough  This is a new problem. Episode onset: Started about 1.5 weeks. The problem has been gradually worsening. The problem occurs constantly. The cough is non-productive. Associated symptoms include nasal congestion and postnasal drip. Pertinent negatives include no chills, ear congestion, ear pain, fever, headaches, sore throat or shortness of breath. The symptoms are aggravated by lying down. Treatments tried: [place] candy for coughs. Her past medical history is significant for bronchitis. There is no history of asthma or pneumonia.   Has been cutting down her smoking. Currently down to 1/2 -- 1 PPD, from 2 PPD!  She has tried multiple modalities in the past -- including patches, wellbutrin and chantix.    Past Medical History:  Diagnosis Date  . Anxiety    currently managed on CBD oil, was on medication briefly but made her OCD worse  . Arthritis    hands  . Breast mass, right 09/2012   removed because it was painful, was found to be calcified scar tissue  . Cervical dysplasia age 66  . GDM (gestational diabetes mellitus)    with last pregnancy  . Geographical tongue   . 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 History of kidney stones 10/2011  . 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 Hx of migraines   . OCD (obsessive compulsive disorder)    cleanliness with home and workspace  . Rosacea   . Sinus congestion 10/12/2012  . Sleep apnea 04/2017   patient is wearing C-pap  . Tobacco abuse 1989   max was 1.5 packs per [place]  . Vitamin D deficiency 10/2013   Value 29  . Vitiligo      Social History   Socioeconomic History  . Marital status: Married    Spouse name: Not on file  . Number of children: 3  .  Years of education: Not on file  . Highest education level: Not on file  Occupational History  . Not on file  Social Needs  . Financial resource strain: Not on file  . Food insecurity:    Worry: Not on file    Inability: Not on file  . Transportation needs:    Medical: Not on file    Non-medical: Not on file  Tobacco Use  . Smoking status: Current Every [place] Smoker    Packs/[place]: 0.50    Last attempt to quit: 08/17/2012    Years since quitting: 5.7  . Smokeless tobacco: Never Used  . Tobacco comment: restarted 10/17 d/t husband losing job  Substance and Sexual Activity  . Alcohol use: Yes    Comment: rare, twice a year  . Drug use: No  . Sexual activity: Yes    Partners: Male    Birth control/protection: IUD    Comment: Mirena inserted 03-2015-1st intercourse 47 yo-More than 5 partners  Lifestyle  . Physical activity:    Days per week: Not on file    Minutes per session: Not on file  . Stress: Not on file  Relationships  . Social connections:    Talks on phone: Not on file    Gets together: Not on file    Attends religious service: Not on  file    Active member of club or organization: Not on file    Attends meetings of clubs or organizations: Not on file    Relationship status: Not on file  . Intimate partner violence:    Fear of current or ex partner: Not on file    Emotionally abused: Not on file    Physically abused: Not on file    Forced sexual activity: Not on file  Other Topics Concern  . Not on file  Social History Narrative   Homemaker   2 boys (19 and 58)   30.45 year old girl   Does Medieval recreator; now sews costumes    Past Surgical History:  Procedure Laterality Date  . ABDOMINOPLASTY  2008  . BREAST BIOPSY Right 10/15/2012   Procedure: BREAST BIOPSY;  Surgeon: Haywood Lasso, MD;  Location: [facility];  Service: General;  Laterality: Right;  removal right breast mass  . BREAST EXCISIONAL BIOPSY Right   . BREAST SURGERY     Lift   . CESAREAN SECTION  2004, 2007  . CESAREAN SECTION N/A 02/07/2015   Procedure: CESAREAN SECTION;  Surgeon: Jerelyn Charles, MD;  Location: [facility];  Service: Obstetrics;  Laterality: N/A;  EDD: 02/13/15   . dialation and cutterage    . GYNECOLOGIC CRYOSURGERY  age 58  . INTRAUTERINE DEVICE INSERTION  03/2015   Mirena at [facility]  . REDUCTION MAMMAPLASTY Bilateral   . TONSILLECTOMY  2000    Family History  Problem Relation Age of Onset  . Arthritis Mother   . High Cholesterol Mother   . High blood pressure Mother   . Hearing loss Father   . High Cholesterol Father   . High blood pressure Father   . Cancer Maternal Grandfather        lymphoma  . Cancer Paternal Grandfather        liver  . Alcohol abuse Paternal Grandfather   . Lung cancer Maternal Grandmother        and maternal uncle; small cell   . Arthritis Maternal Grandmother   . Hyperlipidemia Maternal Grandmother   . Hypertension Maternal Grandmother   . Hearing loss Maternal Grandmother   . Cancer Maternal Uncle        Lung cancer  . Hyperlipidemia Other   . Asthma Brother   . Alcohol abuse Paternal Grandmother   . Hypertension Paternal Grandmother   . Eczema Son   . Colon cancer Neg Hx   . Breast cancer Neg Hx   . Allergic rhinitis Neg Hx   . Angioedema Neg Hx   . Urticaria Neg Hx     Allergies  Allergen Reactions  . Ampicillin Hives  . Dust Mite Extract Cough  . Erythromycin Nausea And Vomiting  . Mold Extract [Trichophyton] Cough  . Other Hives    SAUERKRAUT  . Wine [Alcohol] Hives  . Ciprofloxacin Hives and Rash    Current Medications:   Current Outpatient Medications:  .  Armodafinil 250 MG tablet, Take 1 tablet (250 mg total) by mouth daily., Disp: 30 tablet, Rfl: 2 .  azelastine (ASTELIN) 0.1 % nasal spray, Place 2 sprays into both nostrils 2 (two) times daily. Use in each nostril as directed, Disp: 30 mL, Rfl: 5 .  Carbinoxamine Maleate (RYVENT) 6 MG TABS, Take 6 mg by mouth 2 (two)  times daily., Disp: 60 tablet, Rfl: 5 .  Fluticasone Propionate (XHANCE) 93 MCG/ACT EXHU, Place 2 sprays into the nose 2 (two)  times daily., Disp: 36 mL, Rfl: 5 .  ibuprofen (ADVIL,MOTRIN) 200 MG tablet, Take 800 mg by mouth every 8 (eight) hours as needed. Patient takes 4 at a time , Disp: , Rfl:  .  levonorgestrel (MIRENA) 20 MCG/24HR IUD, 1 each by Intrauterine route once. Inserted by GYN on 03/2015 needs to be removed 03/2020., Disp: , Rfl:  .  OVER THE COUNTER MEDICATION, Place 750 mg under the tongue daily. CBD Oil, Disp: , Rfl:  .  ranitidine (ZANTAC) 150 MG tablet, Take 1 tablet (150 mg total) by mouth daily at 12 noon., Disp: 30 tablet, Rfl: 1 .  rizatriptan (MAXALT-MLT) 10 MG disintegrating tablet, Take 1 tablet (10 mg total) by mouth as needed for migraine. May repeat in 2 hours if needed, Disp: 10 tablet, Rfl: 0 .  tiZANidine (ZANAFLEX) 4 MG tablet, Take 4 mg by mouth every 6 (six) hours as needed for muscle spasms., Disp: , Rfl:  .  valACYclovir (VALTREX) 500 MG tablet, Take 1 tablet (500 mg total) by mouth daily. (Patient taking differently: Take 500 mg by mouth as needed. ), Disp: 90 tablet, Rfl: 4 .  Vitamin D, Ergocalciferol, (DRISDOL) 50000 units CAPS capsule, Take 1 capsule (50,000 Units total) by mouth every 7 (seven) days., Disp: 5 capsule, Rfl: 0 .  azithromycin (ZITHROMAX) 250 MG tablet, Take two tablets on [facility] 1, then 1 tablet daily x 4 days, Disp: 6 tablet, Rfl: 0 .  benzonatate (TESSALON) 100 MG capsule, Take 1 capsule (100 mg total) by mouth 2 (two) times daily as needed for cough., Disp: 20 capsule, Rfl: 0 .  predniSONE (DELTASONE) 20 MG tablet, Take 2 tablets (40 mg total) by mouth daily., Disp: 10 tablet, Rfl: 0  Current Facility-Administered Medications:  .  cyanocobalamin ((VITAMIN B-12)) injection 1,000 mcg, 1,000 mcg, Intramuscular, Once, Fowler, [place], PA   Review of Systems:   Review of Systems  Constitutional: Negative for chills and fever.  HENT: Positive  for postnasal drip. Negative for ear pain and sore throat.   Respiratory: Positive for cough. Negative for shortness of breath.   Neurological: Negative for headaches.    Vitals:   Vitals:   05/19/18 1304  BP: 110/70  Pulse: 76  Temp: 98.6 F (37 C)  TempSrc: Oral  SpO2: 96%  Weight: 199 lb 8 oz (90.5 kg)  Height: 5\' 4"  (1.626 m)     Body mass index is 34.24 kg/m.  Physical Exam:   Physical Exam  Constitutional: She appears well-developed. She is cooperative.  Non-toxic appearance. She does not have a sickly appearance. She does not appear ill. No distress.  HENT:  Head: Normocephalic and atraumatic.  Right Ear: Tympanic membrane, external ear and ear canal normal. Tympanic membrane is not erythematous, not retracted and not bulging.  Left Ear: Tympanic membrane, external ear and ear canal normal. Tympanic membrane is not erythematous, not retracted and not bulging.  Nose: Nose normal. Right sinus exhibits no maxillary sinus tenderness and no frontal sinus tenderness. Left sinus exhibits no maxillary sinus tenderness and no frontal sinus tenderness.  Mouth/Throat: Uvula is midline and mucous membranes are normal. Posterior oropharyngeal erythema present. No posterior oropharyngeal edema.  Eyes: Conjunctivae and lids are normal.  Neck: Trachea normal.  Cardiovascular: Normal rate, regular rhythm, S1 normal, S2 normal and normal heart sounds.  Pulmonary/Chest: Effort normal and breath sounds normal. She has no decreased breath sounds. She has no wheezes. She has no rhonchi. She has no rales.  Coarse breath sounds bilaterally  Lymphadenopathy:    She has no cervical adenopathy.  Neurological: She is alert.  Skin: Skin is warm, dry and intact.  Psychiatric: She has a normal mood and affect. Her speech is normal and behavior is normal.  Nursing note and vitals reviewed.   Assessment and Plan:    Philena was seen today for cough.  Diagnoses and all orders for this  visit:  Cough; Tobacco abuse No red flags on exam.  Will initiate Prednisone and Tessalon Perles per orders. Provided safety net prescription of Azithromycin should her symptoms worsen or not improve with treatment. Discussed taking medications as prescribed. Reviewed return precautions including worsening fever, SOB, worsening cough or other concerns. Push fluids and rest. I recommend that patient follow-up if symptoms worsen or persist despite treatment x 7-10 days, sooner if needed.  B12 deficiency -     cyanocobalamin ((VITAMIN B-12)) injection 1,000 mcg  Other orders -     predniSONE (DELTASONE) 20 MG tablet; Take 2 tablets (40 mg total) by mouth daily. -     azithromycin (ZITHROMAX) 250 MG tablet; Take two tablets on [place] 1, then 1 tablet daily x 4 days -     benzonatate (TESSALON) 100 MG capsule; Take 1 capsule (100 mg total) by mouth 2 (two) times daily as needed for cough.    . Reviewed expectations re: course of current medical issues. . Discussed self-management of symptoms. . Outlined signs and symptoms indicating need for more acute intervention. . Patient verbalized understanding and all questions were answered. . See orders for this visit as documented in the electronic medical record. . Patient received an After-Visit Summary.  CMA or LPN served as scribe during this visit. History, Physical, and Plan performed by medical provider. The above documentation has been reviewed and is accurate and complete.  Inda Coke, PA-C

## 2018-05-20 ENCOUNTER — Encounter: Payer: Self-pay | Admitting: Physician Assistant

## 2018-05-26 ENCOUNTER — Ambulatory Visit (INDEPENDENT_AMBULATORY_CARE_PROVIDER_SITE_OTHER): Payer: PRIVATE HEALTH INSURANCE | Admitting: *Deleted

## 2018-05-26 DIAGNOSIS — E538 Deficiency of other specified B group vitamins: Secondary | ICD-10-CM

## 2018-05-26 MED ORDER — CYANOCOBALAMIN 1000 MCG/ML IJ SOLN
1000.0000 ug | Freq: Once | INTRAMUSCULAR | Status: AC
  Administered 2018-05-26: 1000 ug via INTRAMUSCULAR

## 2018-05-26 NOTE — Progress Notes (Signed)
Per orders of Inda Coke, PA-C, injection of Vit B12 1000 mcg given Right Deltoid by Anselmo Pickler, LPN Patient tolerated injection well. Pt knows to return in one week for next injection.

## 2018-05-29 ENCOUNTER — Other Ambulatory Visit: Payer: Self-pay | Admitting: Physician Assistant

## 2018-06-02 ENCOUNTER — Ambulatory Visit (INDEPENDENT_AMBULATORY_CARE_PROVIDER_SITE_OTHER): Payer: PRIVATE HEALTH INSURANCE | Admitting: *Deleted

## 2018-06-02 DIAGNOSIS — E538 Deficiency of other specified B group vitamins: Secondary | ICD-10-CM | POA: Diagnosis not present

## 2018-06-02 MED ORDER — CYANOCOBALAMIN 1000 MCG/ML IJ SOLN
1000.0000 ug | Freq: Once | INTRAMUSCULAR | Status: AC
  Administered 2018-06-02: 1000 ug via INTRAMUSCULAR

## 2018-06-02 NOTE — Progress Notes (Signed)
Per orders of Inda Coke, PA-C, injection of Vit B12 1000 mcg given in Left Deltoid by Anselmo Pickler, LPN Patient tolerated injection well. Pt know to return in one week for next injection.

## 2018-06-06 ENCOUNTER — Other Ambulatory Visit: Payer: Self-pay | Admitting: Physician Assistant

## 2018-06-07 ENCOUNTER — Other Ambulatory Visit: Payer: Self-pay | Admitting: Physician Assistant

## 2018-06-09 ENCOUNTER — Ambulatory Visit (INDEPENDENT_AMBULATORY_CARE_PROVIDER_SITE_OTHER): Payer: PRIVATE HEALTH INSURANCE

## 2018-06-09 DIAGNOSIS — E538 Deficiency of other specified B group vitamins: Secondary | ICD-10-CM

## 2018-06-09 MED ORDER — CYANOCOBALAMIN 1000 MCG/ML IJ SOLN
1000.0000 ug | Freq: Once | INTRAMUSCULAR | Status: AC
  Administered 2018-06-09: 1000 ug via INTRAMUSCULAR

## 2018-06-09 NOTE — Progress Notes (Signed)
Per orders of Inda Coke, P.A. , injection of Vitamin b12 1000 mcg given right deltoid IM  by Kevan Ny, CMA Patient tolerated injection well.  Pt knows to return in 1 week for next injection.

## 2018-06-09 NOTE — Patient Instructions (Signed)
Return in 1 week (around 10/22) for next injection of Vitamin b12.

## 2018-06-23 ENCOUNTER — Ambulatory Visit: Payer: PRIVATE HEALTH INSURANCE | Admitting: Physician Assistant

## 2018-06-24 ENCOUNTER — Ambulatory Visit (INDEPENDENT_AMBULATORY_CARE_PROVIDER_SITE_OTHER): Payer: PRIVATE HEALTH INSURANCE | Admitting: Physician Assistant

## 2018-06-24 ENCOUNTER — Encounter: Payer: Self-pay | Admitting: Physician Assistant

## 2018-06-24 VITALS — BP 108/66 | HR 77 | Temp 98.4°F | Resp 16 | Wt 193.0 lb

## 2018-06-24 DIAGNOSIS — Z720 Tobacco use: Secondary | ICD-10-CM

## 2018-06-24 DIAGNOSIS — F419 Anxiety disorder, unspecified: Secondary | ICD-10-CM

## 2018-06-24 MED ORDER — SERTRALINE HCL 25 MG PO TABS: 25.0000 mg | ORAL_TABLET | Freq: Every day | ORAL | 2 refills | Status: DC

## 2018-06-24 MED ORDER — NICOTINE 21 MG/24HR TD PT24: 21.0000 mg | MEDICATED_PATCH | Freq: Every day | TRANSDERMAL | 3 refills | Status: DC

## 2018-06-24 NOTE — Progress Notes (Signed)
Mackenzie Meyers is a 47 y.o. female here for a new problem.  History of Present Illness:   Chief Complaint  Patient presents with  . Nicotine Dependence    HPI   Patient presents with desire to stop smoking. She has been smoking for several years. She is currently smoking up to 1 pack per [place]. She recently tried to quit smoking cold [place] but after 1 [place] she had irritability and states that her husband mentioned she was being "mean."  She has tried to even cut down to 6 cigarettes a [place] but that has been difficult for her too.  She tried Chantix in the past but did not have good success with it, felt like it made her "crazy." She has never tried gums, patches or lozenges.  She would like to take something for her anxiety to see if this will help with her mood, give it time to get into her system, and then cut her smoking down. She has been on, what she thinks is, celexa in the past but states from what she could remember it numbed her emotions.  Body mass index is 33.13 kg/m.    Wt Readings from Last 5 Encounters:  06/24/18 193 lb (87.5 kg)  05/19/18 199 lb 8 oz (90.5 kg)  05/07/18 198 lb 12.8 oz (90.2 kg)  04/29/18 198 lb (89.8 kg)  03/30/18 204 lb 8 oz (92.8 kg)      Past Medical History:  Diagnosis Date  . Anxiety    currently managed on CBD oil, was on medication briefly but made her OCD worse  . Arthritis    hands  . Breast mass, right 09/2012   removed because it was painful, was found to be calcified scar tissue  . Cervical dysplasia age 61  . GDM (gestational diabetes mellitus)    with last pregnancy  . Geographical tongue   . 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 History of kidney stones 10/2011  . 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 Hx of migraines   . OCD (obsessive compulsive disorder)    cleanliness with home and workspace  . Rosacea   . Sinus congestion 10/12/2012  . Sleep apnea 04/2017   patient is wearing C-pap  . Tobacco abuse 1989   max was 1.5 packs per [place]  . Vitamin D deficiency 10/2013   Value 29  . Vitiligo      Social History   Socioeconomic History  . Marital status: Married    Spouse name: Not on file  . Number of children: 3  . Years of education: Not on file  . Highest education level: Not on file  Occupational History  . Not on file  Social Needs  . Financial resource strain: Not on file  . Food insecurity:    Worry: Not on file    Inability: Not on file  . Transportation needs:    Medical: Not on file    Non-medical: Not on file  Tobacco Use  . Smoking status: Current Every [place] Smoker    Packs/Oneil: 0.50    Last attempt to quit: 08/17/2012    Years since quitting: 5.8  . Smokeless tobacco: Never Used  . Tobacco comment: restarted 10/17 d/t husband losing job  Substance and Sexual Activity  . Alcohol use: Yes    Comment: rare, twice a year  . Drug use: No  . Sexual activity: Yes  Partners: Male    Birth control/protection: IUD    Comment: Mirena inserted 03-2015-1st intercourse 47 yo-More than 5 partners  Lifestyle  . Physical activity:    Days per week: Not on file    Minutes per session: Not on file  . Stress: Not on file  Relationships  . Social connections:    Talks on phone: Not on file    Gets together: Not on file    Attends religious service: Not on file    Active member of club or organization: Not on file    Attends meetings of clubs or organizations: Not on file    Relationship status: Not on file  . Intimate partner violence:    Fear of current or ex partner: Not on file    Emotionally abused: Not on file    Physically abused: Not on file    Forced sexual activity: Not on file  Other Topics Concern  . 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 ABDOMINOPLASTY   2008  . BREAST BIOPSY Right 10/15/2012   Procedure: BREAST BIOPSY;  Surgeon: Haywood Lasso, MD;  Location: [facility];  Service: General;  Laterality: Right;  removal right breast mass  . BREAST EXCISIONAL BIOPSY Right   . BREAST SURGERY     Lift  . CESAREAN SECTION  2004, 2007  . CESAREAN SECTION N/A 02/07/2015   Procedure: CESAREAN SECTION;  Surgeon: Jerelyn Charles, MD;  Location: [facility];  Service: Obstetrics;  Laterality: N/A;  EDD: 02/13/15   . dialation and cutterage    . GYNECOLOGIC CRYOSURGERY  age 37  . INTRAUTERINE DEVICE INSERTION  03/2015   Mirena at [facility]  . REDUCTION MAMMAPLASTY Bilateral   . TONSILLECTOMY  2000    Family History  Problem Relation Age of Onset  . Arthritis Mother   . High Cholesterol Mother   . High blood pressure Mother   . Hearing loss Father   . High Cholesterol Father   . High blood pressure Father   . Cancer Maternal Grandfather        lymphoma  . Cancer Paternal Grandfather        liver  . Alcohol abuse Paternal Grandfather   . Lung cancer Maternal Grandmother        and maternal uncle; small cell   . Arthritis Maternal Grandmother   . Hyperlipidemia Maternal Grandmother   . Hypertension Maternal Grandmother   . Hearing loss Maternal Grandmother   . Cancer Maternal Uncle        Lung cancer  . Hyperlipidemia Other   . Asthma Brother   . Alcohol abuse Paternal Grandmother   . Hypertension Paternal Grandmother   . Eczema Son   . Colon cancer Neg Hx   . Breast cancer Neg Hx   . Allergic rhinitis Neg Hx   . Angioedema Neg Hx   . Urticaria Neg Hx     Allergies  Allergen Reactions  . Ampicillin Hives  . Dust Mite Extract Cough  . Erythromycin Nausea And Vomiting  . Mold Extract [Trichophyton] Cough  . Other Hives    [place]  . Wine [Alcohol] Hives  . Ciprofloxacin Hives and Rash    Current Medications:   Current Outpatient Medications:  .  Armodafinil 250 MG tablet,  Take 1 tablet (250 mg  total) by mouth daily., Disp: 30 tablet, Rfl: 2 .  azelastine (ASTELIN) 0.1 % nasal spray, Place 2 sprays into both nostrils 2 (two) times daily. Use in each nostril as directed, Disp: 30 mL, Rfl: 5 .  benzonatate (TESSALON) 100 MG capsule, Take 1 capsule (100 mg total) by mouth 2 (two) times daily as needed for cough., Disp: 20 capsule, Rfl: 0 .  Carbinoxamine Maleate (RYVENT) 6 MG TABS, Take 6 mg by mouth 2 (two) times daily., Disp: 60 tablet, Rfl: 5 .  Fluticasone Propionate (XHANCE) 93 MCG/ACT EXHU, Place 2 sprays into the nose 2 (two) times daily., Disp: 36 mL, Rfl: 5 .  ibuprofen (ADVIL,MOTRIN) 200 MG tablet, Take 800 mg by mouth every 8 (eight) hours as needed. Patient takes 4 at a time , Disp: , Rfl:  .  levonorgestrel (MIRENA) 20 MCG/24HR IUD, 1 each by Intrauterine route once. Inserted by GYN on 03/2015 needs to be removed 03/2020., Disp: , Rfl:  .  OVER THE COUNTER MEDICATION, Place 750 mg under the tongue daily. CBD Oil, Disp: , Rfl:  .  predniSONE (DELTASONE) 20 MG tablet, Take 2 tablets (40 mg total) by mouth daily., Disp: 10 tablet, Rfl: 0 .  ranitidine (ZANTAC) 150 MG tablet, TAKE 1 TABLET (150 MG TOTAL) BY MOUTH DAILY AT 12 NOON., Disp: 30 tablet, Rfl: 1 .  rizatriptan (MAXALT-MLT) 10 MG disintegrating tablet, TAKE 1 TABLET (10 MG TOTAL) BY MOUTH AS NEEDED FOR MIGRAINE. MAY REPEAT IN 2 HOURS IF NEEDED, Disp: 9 tablet, Rfl: 1 .  tiZANidine (ZANAFLEX) 4 MG tablet, Take 4 mg by mouth every 6 (six) hours as needed for muscle spasms., Disp: , Rfl:  .  valACYclovir (VALTREX) 500 MG tablet, Take 1 tablet (500 mg total) by mouth daily. (Patient taking differently: Take 500 mg by mouth as needed. ), Disp: 90 tablet, Rfl: 4 .  Vitamin D, Ergocalciferol, (DRISDOL) 50000 units CAPS capsule, TAKE 1 CAPSULE (50,000 UNITS TOTAL) BY MOUTH EVERY 7 (SEVEN) DAYS., Disp: 4 capsule, Rfl: 1 .  nicotine (NICODERM CQ - DOSED IN MG/24 HOURS) 21 mg/24hr patch, Place 1 patch (21 mg total) onto the skin daily.,  Disp: 28 patch, Rfl: 3 .  sertraline (ZOLOFT) 25 MG tablet, Take 1 tablet (25 mg total) by mouth daily., Disp: 30 tablet, Rfl: 2   Review of Systems:   ROS  Negative unless otherwise specified per HPI.  Vitals:   Vitals:   06/24/18 1301  BP: 108/66  Pulse: 77  Resp: 16  Temp: 98.4 F (36.9 C)  TempSrc: Oral  SpO2: 98%  Weight: 193 lb (87.5 kg)     Body mass index is 33.13 kg/m.  Physical Exam:   Physical Exam  Constitutional: She appears well-developed. She is cooperative.  Non-toxic appearance. She does not have a sickly appearance. She does not appear ill. No distress.  Cardiovascular: Normal rate, regular rhythm, S1 normal, S2 normal, normal heart sounds and normal pulses.  No LE edema  Pulmonary/Chest: Effort normal and breath sounds normal.  Neurological: She is alert. GCS eye subscore is 4. GCS verbal subscore is 5. GCS motor subscore is 6.  Skin: Skin is warm, dry and intact.  Psychiatric: She has a normal mood and affect. Her speech is normal and behavior is normal.  Nursing note and vitals reviewed.   Assessment and Plan:    Jessia was seen today for nicotine dependence.  Diagnoses and all orders for this visit:  Tobacco  abuse and Anxiety Start Zoloft 25 mg daily. After about two weeks, we are going to try to have her start patches daily. She plans to buy gum to use for cravings. I also discussed the option of Wellbutrin, but we both decided that may be too activating for her at this time, however she is open to it if this does not work out. Follow-up in 6 weeks, sooner if needed.  Other orders -     sertraline (ZOLOFT) 25 MG tablet; Take 1 tablet (25 mg total) by mouth daily. -     nicotine (NICODERM CQ - DOSED IN MG/24 HOURS) 21 mg/24hr patch; Place 1 patch (21 mg total) onto the skin daily.  . Reviewed expectations re: course of current medical issues. . Discussed self-management of symptoms. . Outlined signs and symptoms indicating need for more acute  intervention. . Patient verbalized understanding and all questions were answered. . See orders for this visit as documented in the electronic medical record. . Patient received an After-Visit Summary.  Inda Coke, PA-C

## 2018-06-24 NOTE — Patient Instructions (Signed)
Start 25 mg Zoloft.  In two weeks, start Nicotine patches. Gums can be purchased over the counter.  Nicotine skin patches What is this medicine? NICOTINE ([place] teen) helps people stop smoking. The patches replace the nicotine found in cigarettes and help to decrease withdrawal effects. They are most effective when used in combination with a stop-smoking program. This medicine may be used for other purposes; ask your health care provider or pharmacist if you have questions. COMMON BRAND NAME(S): Habitrol, Nicoderm CQ, Nicotrol What should I tell my health care provider before I take this medicine? They need to know if you have any of these conditions: -diabetes -heart disease, angina, irregular heartbeat or previous heart attack -high blood pressure -lung disease, including asthma -overactive thyroid -pheochromocytoma -seizures or a history of seizures -skin problems, like eczema -stomach problems or ulcers -an unusual or allergic reaction to nicotine, adhesives, other medicines, foods, dyes, or preservatives -pregnant or trying to get pregnant -breast-feeding How should I use this medicine? This medicine is for use on the skin. Follow the directions that come with the patches. Find an area of skin on your upper arm, chest, or back that is clean, dry, greaseless, undamaged and hairless. Wash hands with plain soap and water. Do not use anything that contains aloe, lanolin or glycerin as these may prevent the patch from sticking. Dry thoroughly. Remove the patch from the sealed pouch. Do not try to cut or trim the patch. Using your palm, press the patch firmly in place for 10 seconds to make sure that there is good contact with your skin. After applying the patch, wash your hands. Change the patch every [place], keeping to a regular schedule. When you apply a new patch, use a new area of skin. Wait at least 1 week before using the same area again. Talk to your pediatrician regarding the use of  this medicine in children. Special care may be needed. Overdosage: If you think you have taken too much of this medicine contact a poison control center or emergency room at once. NOTE: This medicine is only for you. Do not share this medicine with others. What if I miss a dose? If you forget to replace a patch, use it as soon as you can. Only use one patch at a time and do not leave on the skin for longer than directed. If a patch falls off, you can replace it, but keep to your schedule and remove the patch at the right time. What may interact with this medicine? -medicines for asthma -medicines for blood pressure -medicines for mental depression This list may not describe all possible interactions. Give your health care provider a list of all the medicines, herbs, non-prescription drugs, or dietary supplements you use. Also tell them if you smoke, drink alcohol, or use illegal drugs. Some items may interact with your medicine. What should I watch for while using this medicine? You should begin using the nicotine patch the [place] you stop smoking. It is okay if you do not succeed at your attempt to quit and have a cigarette. You can still continue your quit attempt and keep using the product as directed. Just throw away your cigarettes and get back to your quit plan. You can keep the patch in place during swimming, bathing, and showering. If your patch falls off during these activities, replace it. When you first apply the patch, your skin may itch or burn. This should go away soon. When you remove a patch, the skin  may look red, but this should only last for a few days. Call your doctor or health care professional if skin redness does not go away after 4 days, if your skin swells, or if you get a rash. If you are a diabetic and you quit smoking, the effects of insulin may be increased and you may need to reduce your insulin dose. Check with your doctor or health care professional about how you should  adjust your insulin dose. If you are going to have a magnetic resonance imaging (MRI) procedure, tell your MRI technician if you have this patch on your body. It must be removed before a MRI. What side effects may I notice from receiving this medicine? Side effects that you should report to your doctor or health care professional as soon as possible: -allergic reactions like skin rash, itching or hives, swelling of the face, lips, or tongue -breathing problems -changes in hearing -changes in vision -chest pain -cold sweats -confusion -fast, irregular heartbeat -feeling faint or lightheaded, falls -headache -increased saliva -skin redness that lasts more than 4 days -stomach pain -signs and symptoms of nicotine overdose like nausea; vomiting; dizziness; weakness; and rapid heartbeat Side effects that usually do not require medical attention (report to your doctor or health care professional if they continue or are bothersome): -diarrhea -dry mouth -hiccups -irritability -nervousness or restlessness -trouble sleeping or vivid dreams This list may not describe all possible side effects. Call your doctor for medical advice about side effects. You may report side effects to FDA at 1-800-FDA-1088. Where should I keep my medicine? Keep out of the reach of children. Store at room temperature between 20 and 25 degrees C (68 and 77 degrees F). Protect from heat and light. Store in International aid/development worker until ready to use. Throw away unused medicine after the expiration date. When you remove a patch, fold with sticky sides together; put in an empty opened pouch and throw away. NOTE: This sheet is a summary. It may not cover all possible information. If you have questions about this medicine, talk to your doctor, pharmacist, or health care provider.  2018 Elsevier/Gold Standard (2014-07-11 15:46:21)

## 2018-07-10 ENCOUNTER — Ambulatory Visit (INDEPENDENT_AMBULATORY_CARE_PROVIDER_SITE_OTHER): Payer: PRIVATE HEALTH INSURANCE | Admitting: *Deleted

## 2018-07-10 ENCOUNTER — Encounter: Payer: Self-pay | Admitting: *Deleted

## 2018-07-10 DIAGNOSIS — E538 Deficiency of other specified B group vitamins: Secondary | ICD-10-CM

## 2018-07-10 MED ORDER — CYANOCOBALAMIN 1000 MCG/ML IJ SOLN
1000.0000 ug | Freq: Once | INTRAMUSCULAR | Status: AC
  Administered 2018-07-10: 1000 ug via INTRAMUSCULAR

## 2018-07-10 NOTE — Progress Notes (Signed)
Per orders of Inda Coke, PA-C  injection of Cyanocobalamin 1000 mcg given Left Deltoid by Anselmo Pickler, LPN Patient tolerated injection well and knows to return in one month for next injection.

## 2018-07-17 ENCOUNTER — Other Ambulatory Visit: Payer: Self-pay | Admitting: Physician Assistant

## 2018-07-28 ENCOUNTER — Encounter: Payer: Self-pay | Admitting: Physician Assistant

## 2018-07-28 ENCOUNTER — Ambulatory Visit (INDEPENDENT_AMBULATORY_CARE_PROVIDER_SITE_OTHER): Payer: PRIVATE HEALTH INSURANCE | Admitting: Physician Assistant

## 2018-07-28 VITALS — BP 90/60 | HR 79 | Temp 98.0°F | Ht 64.0 in | Wt 190.4 lb

## 2018-07-28 DIAGNOSIS — L989 Disorder of the skin and subcutaneous tissue, unspecified: Secondary | ICD-10-CM | POA: Diagnosis not present

## 2018-07-28 DIAGNOSIS — H6981 Other specified disorders of Eustachian tube, right ear: Secondary | ICD-10-CM | POA: Diagnosis not present

## 2018-07-28 MED ORDER — MUPIROCIN CALCIUM 2 % EX CREA: TOPICAL_CREAM | CUTANEOUS | 0 refills | Status: DC

## 2018-07-28 NOTE — Patient Instructions (Signed)
It was great to see you!  Resume your allergy meds. Consider flonase.  Use bactroban cream on your belly when needed.  Take care,  Inda Coke PA-C

## 2018-07-28 NOTE — Progress Notes (Signed)
Mackenzie Meyers is a 47 y.o. female here for a new problem.  I acted as a Education administrator for [employer], PA-C Anselmo Pickler, LPN  History of Present Illness:   Chief Complaint  Patient presents with  . Otalgia    Otalgia   There is pain in the right ear. This is a new problem. The current episode started yesterday. The problem occurs every few hours. The problem has been unchanged. There has been no fever. The pain is at a severity of 5/10. The pain is moderate. Associated symptoms include diarrhea (once a [place] from medication). Pertinent negatives include no coughing, ear discharge, headaches, sore throat or vomiting. She has tried ear drops and NSAIDs for the symptoms. The treatment provided mild relief.   She recently stopped her allergy medications, these are prescriptions and she wanted to save them for when she actually needed them.  Skin sores Patient notes that over the past couple weeks she has developed some sores on her pannus.  She states that the area can sometimes get moist and with her weight loss the area is becoming irritated when it rubs against her closed.  A few the areas have opened up and have some purulent discharge.  She has not tried anything for this.    Past Medical History:  Diagnosis Date  . Anxiety    currently managed on CBD oil, was on medication briefly but made her OCD worse  . Arthritis    hands  . Breast mass, right 09/2012   removed because it was painful, was found to be calcified scar tissue  . Cervical dysplasia age 70  . GDM (gestational diabetes mellitus)    with last pregnancy  . Geographical tongue   . 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 History of kidney stones 10/2011  . 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 Hx of migraines   . OCD (obsessive compulsive disorder)    cleanliness with home and workspace  . Rosacea   . Sinus congestion 10/12/2012  . Sleep apnea 04/2017    patient is wearing C-pap  . Tobacco abuse 1989   max was 1.5 packs per [place]  . Vitamin D deficiency 10/2013   Value 29  . Vitiligo      Social History   Socioeconomic History  . Marital status: Married    Spouse name: Not on file  . Number of children: 3  . Years of education: Not on file  . Highest education level: Not on file  Occupational History  . Not on file  Social Needs  . Financial resource strain: Not on file  . Food insecurity:    Worry: Not on file    Inability: Not on file  . Transportation needs:    Medical: Not on file    Non-medical: Not on file  Tobacco Use  . Smoking status: Current Every [place] Smoker    Packs/Rubi: 0.50    Last attempt to quit: 08/17/2012    Years since quitting: 5.9  . Smokeless tobacco: Never Used  . Tobacco comment: restarted 10/17 d/t husband losing job  Substance and Sexual Activity  . Alcohol use: Yes    Comment: rare, twice a year  . Drug use: No  . Sexual activity: Yes    Partners: Male    Birth control/protection: IUD    Comment: Mirena inserted 03-2015-1st intercourse 47 yo-More than 5 partners  Lifestyle  .  Physical activity:    Days per week: Not on file    Minutes per session: Not on file  . Stress: Not on file  Relationships  . Social connections:    Talks on phone: Not on file    Gets together: Not on file    Attends religious service: Not on file    Active member of club or organization: Not on file    Attends meetings of clubs or organizations: Not on file    Relationship status: Not on file  . Intimate partner violence:    Fear of current or ex partner: Not on file    Emotionally abused: Not on file    Physically abused: Not on file    Forced sexual activity: Not on file  Other Topics Concern  . Not on file  Social History Narrative   Homemaker   2 boys (80 and 32)   36.45 year old girl   Does Medieval recreator; now sews costumes    Past Surgical History:  Procedure Laterality Date  . ABDOMINOPLASTY   2008  . BREAST BIOPSY Right 10/15/2012   Procedure: BREAST BIOPSY;  Surgeon: Haywood Lasso, MD;  Location: [place];  Service: General;  Laterality: Right;  removal right breast mass  . BREAST EXCISIONAL BIOPSY Right   . BREAST SURGERY     Lift  . CESAREAN SECTION  2004, 2007  . CESAREAN SECTION N/A 02/07/2015   Procedure: CESAREAN SECTION;  Surgeon: Jerelyn Charles, MD;  Location: [facility];  Service: Obstetrics;  Laterality: N/A;  EDD: 02/13/15   . dialation and cutterage    . GYNECOLOGIC CRYOSURGERY  age 5  . INTRAUTERINE DEVICE INSERTION  03/2015   Mirena at [place]  . REDUCTION MAMMAPLASTY Bilateral   . TONSILLECTOMY  2000    Family History  Problem Relation Age of Onset  . Arthritis Mother   . High Cholesterol Mother   . High blood pressure Mother   . Hearing loss Father   . High Cholesterol Father   . High blood pressure Father   . Cancer Maternal Grandfather        lymphoma  . Cancer Paternal Grandfather        liver  . Alcohol abuse Paternal Grandfather   . Lung cancer Maternal Grandmother        and maternal uncle; small cell   . Arthritis Maternal Grandmother   . Hyperlipidemia Maternal Grandmother   . Hypertension Maternal Grandmother   . Hearing loss Maternal Grandmother   . Cancer Maternal Uncle        Lung cancer  . Hyperlipidemia Other   . Asthma Brother   . Alcohol abuse Paternal Grandmother   . Hypertension Paternal Grandmother   . Eczema Son   . Colon cancer Neg Hx   . Breast cancer Neg Hx   . Allergic rhinitis Neg Hx   . Angioedema Neg Hx   . Urticaria Neg Hx     Allergies  Allergen Reactions  . Ampicillin Hives  . Dust Mite Extract Cough  . Erythromycin Nausea And Vomiting  . Mold Extract [Trichophyton] Cough  . Other Hives    SAUERKRAUT  . Wine [Alcohol] Hives  . Ciprofloxacin Hives and Rash    Current Medications:   Current Outpatient Medications:  .  Armodafinil 250 MG tablet, Take 1 tablet (250 mg  total) by mouth daily., Disp: 30 tablet, Rfl: 2 .  azelastine (ASTELIN) 0.1 % nasal spray, Place 2  sprays into both nostrils 2 (two) times daily. Use in each nostril as directed, Disp: 30 mL, Rfl: 5 .  Carbinoxamine Maleate (RYVENT) 6 MG TABS, Take 6 mg by mouth 2 (two) times daily., Disp: 60 tablet, Rfl: 5 .  Fluticasone Propionate (XHANCE) 93 MCG/ACT EXHU, Place 2 sprays into the nose 2 (two) times daily., Disp: 36 mL, Rfl: 5 .  ibuprofen (ADVIL,MOTRIN) 200 MG tablet, Take 800 mg by mouth every 8 (eight) hours as needed. Patient takes 4 at a time , Disp: , Rfl:  .  levonorgestrel (MIRENA) 20 MCG/24HR IUD, 1 each by Intrauterine route once. Inserted by GYN on 03/2015 needs to be removed 03/2020., Disp: , Rfl:  .  nicotine (NICODERM CQ - DOSED IN MG/24 HOURS) 21 mg/24hr patch, Place 1 patch (21 mg total) onto the skin daily., Disp: 28 patch, Rfl: 3 .  OVER THE COUNTER MEDICATION, Place 750 mg under the tongue daily. CBD Oil, Disp: , Rfl:  .  ranitidine (ZANTAC) 150 MG tablet, TAKE 1 TABLET (150 MG TOTAL) BY MOUTH DAILY AT 12 NOON., Disp: 30 tablet, Rfl: 1 .  rizatriptan (MAXALT-MLT) 10 MG disintegrating tablet, TAKE 1 TABLET (10 MG TOTAL) BY MOUTH AS NEEDED FOR MIGRAINE. MAY REPEAT IN 2 HOURS IF NEEDED, Disp: 9 tablet, Rfl: 1 .  sertraline (ZOLOFT) 25 MG tablet, TAKE 1 TABLET BY MOUTH EVERY [place], Disp: 90 tablet, Rfl: 1 .  tiZANidine (ZANAFLEX) 4 MG tablet, Take 4 mg by mouth every 6 (six) hours as needed for muscle spasms., Disp: , Rfl:  .  valACYclovir (VALTREX) 500 MG tablet, Take 1 tablet (500 mg total) by mouth daily. (Patient taking differently: Take 500 mg by mouth as needed. ), Disp: 90 tablet, Rfl: 4 .  Vitamin D, Ergocalciferol, (DRISDOL) 50000 units CAPS capsule, TAKE 1 CAPSULE (50,000 UNITS TOTAL) BY MOUTH EVERY 7 (SEVEN) DAYS., Disp: 4 capsule, Rfl: 1 .  mupirocin cream (BACTROBAN) 2 %, Apply to affected area 1-2 times daily, Disp: 15 g, Rfl: 0   Review of Systems:   Review of Systems   HENT: Positive for ear pain. Negative for ear discharge and sore throat.   Respiratory: Negative for cough.   Gastrointestinal: Positive for diarrhea (once a [place] from medication). Negative for vomiting.  Neurological: Negative for headaches.    Vitals:   Vitals:   07/28/18 0742  BP: 90/60  Pulse: 79  Temp: 98 F (36.7 C)  TempSrc: Oral  SpO2: 97%  Weight: 190 lb 6.1 oz (86.4 kg)  Height: 5\' 4"  (1.626 m)     Body mass index is 32.68 kg/m.  Physical Exam:   Physical Exam  Constitutional: She appears well-developed. She is cooperative.  Non-toxic appearance. She does not have a sickly appearance. She does not appear ill. No distress.  HENT:  Head: Normocephalic and atraumatic.  Right Ear: Tympanic membrane, external ear and ear canal normal. Tympanic membrane is not erythematous, not retracted and not bulging.  Left Ear: Tympanic membrane, external ear and ear canal normal. Tympanic membrane is not erythematous, not retracted and not bulging.  Nose: Nose normal. Right sinus exhibits no maxillary sinus tenderness and no frontal sinus tenderness. Left sinus exhibits no maxillary sinus tenderness and no frontal sinus tenderness.  Mouth/Throat: Uvula is midline. No posterior oropharyngeal edema or posterior oropharyngeal erythema.  Eyes: Conjunctivae and lids are normal.  Neck: Trachea normal.  Cardiovascular: Normal rate, regular rhythm, S1 normal, S2 normal and normal heart sounds.  Pulmonary/Chest: Effort normal and breath  sounds normal. She has no decreased breath sounds. She has no wheezes. She has no rhonchi. She has no rales.  Lymphadenopathy:    She has no cervical adenopathy.  Neurological: She is alert.  Skin: Skin is warm, dry and intact.  Approximately 3 areas of 1-2 mm erythematous lesions to abdominal panniculus, no active discharge or odor evident  Psychiatric: She has a normal mood and affect. Her speech is normal and behavior is normal.  Nursing note and vitals  reviewed.    Assessment and Plan:   Lynee was seen today for otalgia.  Diagnoses and all orders for this visit:  Eustachian tube dysfunction, right No red flags on exam.  She is planning to resume her allergy meds, and is also considering Flonase.  Reviewed return precautions including worsening fever, SOB, worsening cough or other concerns. Push fluids and rest. I recommend that patient follow-up if symptoms worsen or persist despite treatment x 7-10 days, sooner if needed.  Skin lesion I suspect that she has some sort of contact dermatitis from where her belly is rubbing against her clothes.  Discussed avoiding scratching or touching to prevent secondary infection.  I did prescribe her Bactroban to apply to area as needed.  She does have a dermatologist that she plans to reach out to if this does not improve.  Other orders -     mupirocin cream (BACTROBAN) 2 %; Apply to affected area 1-2 times daily    . Reviewed expectations re: course of current medical issues. . Discussed self-management of symptoms. . Outlined signs and symptoms indicating need for more acute intervention. . Patient verbalized understanding and all questions were answered. . See orders for this visit as documented in the electronic medical record. . Patient received an After-Visit Summary.  CMA or LPN served as scribe during this visit. History, Physical, and Plan performed by medical provider. The above documentation has been reviewed and is accurate and complete.  Inda Coke, PA-C

## 2018-08-04 ENCOUNTER — Other Ambulatory Visit: Payer: Self-pay | Admitting: Physician Assistant

## 2018-08-10 ENCOUNTER — Ambulatory Visit (INDEPENDENT_AMBULATORY_CARE_PROVIDER_SITE_OTHER): Payer: PRIVATE HEALTH INSURANCE

## 2018-08-10 DIAGNOSIS — E538 Deficiency of other specified B group vitamins: Secondary | ICD-10-CM | POA: Diagnosis not present

## 2018-08-10 MED ORDER — CYANOCOBALAMIN 1000 MCG/ML IJ SOLN
1000.0000 ug | Freq: Once | INTRAMUSCULAR | Status: AC
  Administered 2018-08-10: 1000 ug via INTRAMUSCULAR

## 2018-08-10 NOTE — Progress Notes (Signed)
Per orders of Kerry Hough, Pa , injection of B-12 given by Francella Solian in right deltoid. Patient tolerated injection well. Will make next app in one month on way out.

## 2018-08-11 ENCOUNTER — Encounter: Payer: Self-pay | Admitting: Physician Assistant

## 2018-08-11 ENCOUNTER — Ambulatory Visit (INDEPENDENT_AMBULATORY_CARE_PROVIDER_SITE_OTHER): Payer: PRIVATE HEALTH INSURANCE | Admitting: Physician Assistant

## 2018-08-11 VITALS — BP 118/70 | HR 71 | Temp 98.4°F | Ht 64.0 in | Wt 188.4 lb

## 2018-08-11 DIAGNOSIS — Z13220 Encounter for screening for lipoid disorders: Secondary | ICD-10-CM

## 2018-08-11 DIAGNOSIS — F419 Anxiety disorder, unspecified: Secondary | ICD-10-CM | POA: Diagnosis not present

## 2018-08-11 DIAGNOSIS — Z8632 Personal history of gestational diabetes: Secondary | ICD-10-CM | POA: Diagnosis not present

## 2018-08-11 DIAGNOSIS — Z136 Encounter for screening for cardiovascular disorders: Secondary | ICD-10-CM

## 2018-08-11 DIAGNOSIS — E8881 Metabolic syndrome: Secondary | ICD-10-CM

## 2018-08-11 DIAGNOSIS — E559 Vitamin D deficiency, unspecified: Secondary | ICD-10-CM | POA: Diagnosis not present

## 2018-08-11 DIAGNOSIS — E538 Deficiency of other specified B group vitamins: Secondary | ICD-10-CM

## 2018-08-11 DIAGNOSIS — Z720 Tobacco use: Secondary | ICD-10-CM

## 2018-08-11 MED ORDER — SERTRALINE HCL 50 MG PO TABS: 50.0000 mg | ORAL_TABLET | Freq: Every day | ORAL | 1 refills | Status: DC

## 2018-08-11 NOTE — Progress Notes (Signed)
Mackenzie Meyers is a 47 y.o. female is here to follow up on smoking cessation.  I acted as a Education administrator for [employer], PA-C Anselmo Pickler, LPN  History of Present Illness:   Chief Complaint  Patient presents with  . Anxiety    Anxiety  Presents for follow-up visit. Symptoms include irritability and obsessions. Patient reports no compulsions, decreased concentration, depressed mood, dizziness, palpitations, panic, shortness of breath or suicidal ideas. Symptoms occur most days. Duration: 30 to 60. The severity of symptoms is moderate. Hours of sleep per night: 6-8. The quality of sleep is good. Nighttime awakenings: one to two.   Compliance with medications is 76-100%. Treatment side effects: Pt states has had diarrhea one to two times a Hence since on the Zoloft.   Wt Readings from Last 5 Encounters:  08/11/18 188 lb 6.1 oz (85.4 kg)  07/28/18 190 lb 6.1 oz (86.4 kg)  06/24/18 193 lb (87.5 kg)  05/19/18 199 lb 8 oz (90.5 kg)  05/07/18 198 lb 12.8 oz (90.2 kg)   GAD 7 : Generalized Anxiety Score 08/11/2018 11/18/2017 10/01/2016  Nervous, Anxious, on Edge 3 0 2  Control/stop worrying 0 0 1  Worry too much - different things 1 0 1  Trouble relaxing 1 0 3  Restless 1 0 3  Easily annoyed or irritable 3 0 1  Afraid - awful might happen 0 0 0  Total GAD 7 Score 9 0 11  Anxiety Difficulty Not difficult at all Not difficult at all -   She would also like future labs put in to follow-up on her B12, Vit D, ferritin and HgbA1c levels. She is overall doing well with her lifestyle changes and has been losing weight.     There are no preventive care reminders to display for this patient.  Past Medical History:  Diagnosis Date  . Anxiety    currently managed on CBD oil, was on medication briefly but made her OCD worse  . Arthritis    hands  . Breast mass, right 09/2012   removed because it was painful, was found to be calcified scar tissue  . Cervical dysplasia age 73  . GDM  (gestational diabetes mellitus)    with last pregnancy  . Geographical tongue   . 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 History of kidney stones 10/2011  . 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 Hx of migraines   . OCD (obsessive compulsive disorder)    cleanliness with home and workspace  . Rosacea   . Sinus congestion 10/12/2012  . Sleep apnea 04/2017   patient is wearing C-pap  . Tobacco abuse 1989   max was 1.5 packs per [place]  . Vitamin D deficiency 10/2013   Value 29  . Vitiligo      Social History   Socioeconomic History  . Marital status: Married    Spouse name: Not on file  . Number of children: 3  . Years of education: Not on file  . Highest education level: Not on file  Occupational History  . Not on file  Social Needs  . Financial resource strain: Not on file  . Food insecurity:    Worry: Not on file    Inability: Not on file  . Transportation needs:    Medical: Not on file    Non-medical: Not on file  Tobacco Use  . Smoking status: Current Every  [place] Smoker    Packs/[place]: 0.50    Last attempt to quit: 08/17/2012    Years since quitting: 5.9  . Smokeless tobacco: Never Used  . Tobacco comment: restarted 10/17 d/t husband losing job  Substance and Sexual Activity  . Alcohol use: Yes    Comment: rare, twice a year  . Drug use: No  . Sexual activity: Yes    Partners: Male    Birth control/protection: I.U.D.    Comment: Mirena inserted 03-2015-1st intercourse 47 yo-More than 5 partners  Lifestyle  . Physical activity:    Days per week: Not on file    Minutes per session: Not on file  . Stress: Not on file  Relationships  . Social connections:    Talks on phone: Not on file    Gets together: Not on file    Attends religious service: Not on file    Active member of club or organization: Not on file    Attends meetings of clubs or organizations: Not on file    Relationship status: Not on  file  . Intimate partner violence:    Fear of current or ex partner: Not on file    Emotionally abused: Not on file    Physically abused: Not on file    Forced sexual activity: Not on file  Other Topics Concern  . Not on file  Social History Narrative   Homemaker   2 boys (57 and 58)   55.45 year old girl   Does Medieval recreator; now sews costumes    Past Surgical History:  Procedure Laterality Date  . ABDOMINOPLASTY  2008  . BREAST BIOPSY Right 10/15/2012   Procedure: BREAST BIOPSY;  Surgeon: Haywood Lasso, MD;  Location: [facility];  Service: General;  Laterality: Right;  removal right breast mass  . BREAST EXCISIONAL BIOPSY Right   . BREAST SURGERY     Lift  . CESAREAN SECTION  2004, 2007  . CESAREAN SECTION N/A 02/07/2015   Procedure: CESAREAN SECTION;  Surgeon: Jerelyn Charles, MD;  Location: [facility];  Service: Obstetrics;  Laterality: N/A;  EDD: 02/13/15   . dialation and cutterage    . GYNECOLOGIC CRYOSURGERY  age 63  . INTRAUTERINE DEVICE INSERTION  03/2015   Mirena at [place]  . REDUCTION MAMMAPLASTY Bilateral   . TONSILLECTOMY  2000    Family History  Problem Relation Age of Onset  . Arthritis Mother   . High Cholesterol Mother   . High blood pressure Mother   . Hearing loss Father   . High Cholesterol Father   . High blood pressure Father   . Cancer Maternal Grandfather        lymphoma  . Cancer Paternal Grandfather        liver  . Alcohol abuse Paternal Grandfather   . Lung cancer Maternal Grandmother        and maternal uncle; small cell   . Arthritis Maternal Grandmother   . Hyperlipidemia Maternal Grandmother   . Hypertension Maternal Grandmother   . Hearing loss Maternal Grandmother   . Cancer Maternal Uncle        Lung cancer  . Hyperlipidemia Other   . Asthma Brother   . Alcohol abuse Paternal Grandmother   . Hypertension Paternal Grandmother   . Eczema Son   . Colon cancer Neg Hx   . Breast cancer Neg Hx   .  Allergic rhinitis Neg Hx   . Angioedema Neg Hx   .  Urticaria Neg Hx     PMHx, SurgHx, SocialHx, FamHx, Medications, and Allergies were reviewed in the Visit Navigator and updated as appropriate.   Patient Active Problem List   Diagnosis Date Noted  . Excessive daytime sleepiness 12/25/2017  . Hypersomnia, persistent 12/25/2017  . 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 Lumbar herniated disc 12/25/2017  . Obstructive sleep apnea treated with continuous positive airway pressure (CPAP) 12/08/2017  . Lumbar back pain with radiculopathy affecting left lower extremity 11/24/2017  . History of gestational diabetes mellitus 11/18/2017  . OCD (obsessive compulsive disorder)   . Hx of migraines   . Arthritis   . Sleep apnea 04/26/2017  . Vitamin D deficiency 10/24/2013  . History of kidney stones 10/25/2011  . Vitiligo   . Anxiety   . Tobacco abuse 08/27/1987    Social History   Tobacco Use  . Smoking status: Current Every [place] Smoker    Packs/[place]: 0.50    Last attempt to quit: 08/17/2012    Years since quitting: 5.9  . Smokeless tobacco: Never Used  . Tobacco comment: restarted 10/17 d/t husband losing job  Substance Use Topics  . Alcohol use: Yes    Comment: rare, twice a year  . Drug use: No    Current Medications and Allergies:    Current Outpatient Medications:  .  Armodafinil 250 MG tablet, Take 1 tablet (250 mg total) by mouth daily., Disp: 30 tablet, Rfl: 2 .  azelastine (ASTELIN) 0.1 % nasal spray, Place 2 sprays into both nostrils 2 (two) times daily. Use in each nostril as directed, Disp: 30 mL, Rfl: 5 .  Carbinoxamine Maleate (RYVENT) 6 MG TABS, Take 6 mg by mouth 2 (two) times daily., Disp: 60 tablet, Rfl: 5 .  ferrous sulfate 325 (65 FE) MG EC tablet, Take 325 mg by mouth every other [place]., Disp: , Rfl:  .  Fluticasone Propionate (XHANCE) 93 MCG/ACT EXHU, Place 2 sprays into the nose 2 (two) times daily., Disp: 36 mL, Rfl: 5 .  ibuprofen (ADVIL,MOTRIN) 200 MG tablet, Take 800 mg  by mouth every 8 (eight) hours as needed. Patient takes 4 at a time , Disp: , Rfl:  .  levonorgestrel (MIRENA) 20 MCG/24HR IUD, 1 each by Intrauterine route once. Inserted by GYN on 03/2015 needs to be removed 03/2020., Disp: , Rfl:  .  mupirocin cream (BACTROBAN) 2 %, Apply to affected area 1-2 times daily, Disp: 15 g, Rfl: 0 .  ranitidine (ZANTAC) 150 MG tablet, TAKE 1 TABLET (150 MG TOTAL) BY MOUTH DAILY AT 12 NOON., Disp: 30 tablet, Rfl: 1 .  rizatriptan (MAXALT-MLT) 10 MG disintegrating tablet, TAKE 1 TABLET (10 MG TOTAL) BY MOUTH AS NEEDED FOR MIGRAINE. MAY REPEAT IN 2 HOURS IF NEEDED, Disp: 9 tablet, Rfl: 1 .  tiZANidine (ZANAFLEX) 4 MG tablet, Take 4 mg by mouth every 6 (six) hours as needed for muscle spasms., Disp: , Rfl:  .  valACYclovir (VALTREX) 500 MG tablet, Take 1 tablet (500 mg total) by mouth daily. (Patient taking differently: Take 500 mg by mouth as needed. ), Disp: 90 tablet, Rfl: 4 .  Vitamin D, Ergocalciferol, (DRISDOL) 1.25 MG (50000 UT) CAPS capsule, TAKE 1 CAPSULE (50,000 UNITS TOTAL) BY MOUTH EVERY 7 (SEVEN) DAYS., Disp: 4 capsule, Rfl: 0 .  nicotine (NICODERM CQ - DOSED IN MG/24 HOURS) 21 mg/24hr patch, Place 1 patch (21 mg total) onto the skin daily. (Patient not taking: Reported on 08/11/2018), Disp: 28 patch, Rfl: 3 .  sertraline (ZOLOFT) 50 MG  tablet, Take 1 tablet (50 mg total) by mouth daily., Disp: 30 tablet, Rfl: 1   Allergies  Allergen Reactions  . Ampicillin Hives  . Dust Mite Extract Cough  . Erythromycin Nausea And Vomiting  . Mold Extract [Trichophyton] Cough  . Other Hives    SAUERKRAUT  . Wine [Alcohol] Hives  . Ciprofloxacin Hives and Rash    Review of Systems   Review of Systems  Constitutional: Positive for irritability.  Respiratory: Negative for shortness of breath.   Cardiovascular: Negative for palpitations.  Neurological: Negative for dizziness.  Psychiatric/Behavioral: Negative for decreased concentration and suicidal ideas.     Vitals:   Vitals:   08/11/18 0910  BP: 118/70  Pulse: 71  Temp: 98.4 F (36.9 C)  TempSrc: Oral  SpO2: 97%  Weight: 188 lb 6.1 oz (85.4 kg)  Height: 5\' 4"  (1.626 m)     Body mass index is 32.34 kg/m.   Physical Exam:    Physical Exam Vitals signs and nursing note reviewed.  Constitutional:      General: She is not in acute distress.    Appearance: She is well-developed. She is not ill-appearing or toxic-appearing.  HENT:     Head: Normocephalic and atraumatic.  Cardiovascular:     Rate and Rhythm: Normal rate and regular rhythm.     Heart sounds: Normal heart sounds.  Pulmonary:     Effort: Pulmonary effort is normal. No accessory muscle usage or respiratory distress.     Breath sounds: Normal breath sounds.  Skin:    General: Skin is warm and dry.  Neurological:     Mental Status: She is alert.  Psychiatric:        Speech: Speech normal.        Behavior: Behavior is cooperative.      Assessment and Plan:    Khushbu was seen today for anxiety.  Diagnoses and all orders for this visit:  Anxiety Uncontrolled. Increase Zoloft to 50 mg. Follow-up in 1-2 months, sooner if needed. -     Cancel: Ferritin -     Cancel: CBC with Differential/Platelet -     Cancel: Comprehensive metabolic panel -     CBC with Differential/Platelet; Future -     Comprehensive metabolic panel; Future -     Ferritin; Future  Vitamin D deficiency -     VITAMIN D 25 Hydroxy (Vit-D Deficiency, Fractures)  History of gestational diabetes mellitus -     Cancel: Hemoglobin A1c -     Hemoglobin A1c; Future  Insulin resistance -     Cancel: Hemoglobin A1c -     Hemoglobin A1c; Future  B12 deficiency -     Cancel: Ferritin -     Vitamin B12 -     Ferritin; Future  Tobacco abuse She plans to start her nicotine patches after her anxiety is better controlled. Continue to encourage cessation.  Encounter for lipid screening for cardiovascular disease -     Cancel: Lipid panel -      Lipid panel; Future  Other orders -     sertraline (ZOLOFT) 50 MG tablet; Take 1 tablet (50 mg total) by mouth daily.  . Reviewed expectations re: course of current medical issues. . Discussed self-management of symptoms. . Outlined signs and symptoms indicating need for more acute intervention. . Patient verbalized understanding and all questions were answered. . See orders for this visit as documented in the electronic medical record. . Patient received an After Visit Summary.  CMA or LPN served as scribe during this visit. History, Physical, and Plan performed by medical provider. The above documentation has been reviewed and is accurate and complete.   Inda Coke, PA-C [place] [place] 08/11/2018  Follow-up: No follow-ups on file.

## 2018-08-11 NOTE — Patient Instructions (Signed)
It was great to see you!  Please make an appointment with the lab on your way out. I would like for you to return for lab work within 1-2 weeks. After midnight on the Apt of the lab draw, please do not eat anything. You may have water, black coffee, unsweetened tea.  Increase zoloft to 50 mg daily.  Take care,  Inda Coke PA-C

## 2018-08-14 ENCOUNTER — Ambulatory Visit: Payer: Self-pay | Admitting: *Deleted

## 2018-08-14 ENCOUNTER — Encounter (HOSPITAL_COMMUNITY): Payer: Self-pay

## 2018-08-14 ENCOUNTER — Emergency Department (HOSPITAL_COMMUNITY)
Admission: EM | Admit: 2018-08-14 | Discharge: 2018-08-14 | Disposition: A | Discharge status: Home / Self Care | Payer: PRIVATE HEALTH INSURANCE | Attending: Emergency Medicine | Admitting: Emergency Medicine

## 2018-08-14 ENCOUNTER — Other Ambulatory Visit: Payer: Self-pay

## 2018-08-14 ENCOUNTER — Emergency Department (HOSPITAL_COMMUNITY): Payer: PRIVATE HEALTH INSURANCE

## 2018-08-14 DIAGNOSIS — Z79899 Other long term (current) drug therapy: Secondary | ICD-10-CM | POA: Diagnosis not present

## 2018-08-14 DIAGNOSIS — R0789 Other chest pain: Secondary | ICD-10-CM | POA: Insufficient documentation

## 2018-08-14 DIAGNOSIS — F17200 Nicotine dependence, unspecified, uncomplicated: Secondary | ICD-10-CM | POA: Insufficient documentation

## 2018-08-14 LAB — CBC
HCT: 44.4 % (ref 36.0–46.0)
Hemoglobin: 14.4 g/dL (ref 12.0–15.0)
MCH: 29.5 pg (ref 26.0–34.0)
MCHC: 32.4 g/dL (ref 30.0–36.0)
MCV: 91 fL (ref 80.0–100.0)
Platelets: 270 10*3/uL (ref 150–400)
RBC: 4.88 MIL/uL (ref 3.87–5.11)
RDW: 13.7 % (ref 11.5–15.5)
WBC: 11.5 10*3/uL — ABNORMAL HIGH (ref 4.0–10.5)
nRBC: 0 % (ref 0.0–0.2)

## 2018-08-14 LAB — BASIC METABOLIC PANEL
Anion gap: 8 (ref 5–15)
BUN: 10 mg/dL (ref 6–20)
CO2: 26 mmol/L (ref 22–32)
Calcium: 9.3 mg/dL (ref 8.9–10.3)
Chloride: 103 mmol/L (ref 98–111)
Creatinine, Ser: 0.74 mg/dL (ref 0.44–1.00)
GFR calc Af Amer: >60 mL/min (ref >60)
GFR calc non Af Amer: >60 mL/min (ref >60)
Glucose, Bld: 93 mg/dL (ref 70–99)
Potassium: 3.6 mmol/L (ref 3.5–5.1)
Sodium: 137 mmol/L (ref 135–145)

## 2018-08-14 LAB — D-DIMER, QUANTITATIVE: D-Dimer, Quant: 0.57 ug/mL-FEU — ABNORMAL HIGH (ref 0.00–0.50)

## 2018-08-14 LAB — HEPATIC FUNCTION PANEL
ALT: 18 U/L (ref 0–44)
AST: 19 U/L (ref 15–41)
Albumin: 4.1 g/dL (ref 3.5–5.0)
Alkaline Phosphatase: 64 U/L (ref 38–126)
Bilirubin, Direct: 0.1 mg/dL (ref 0.0–0.2)
Indirect Bilirubin: 0.4 mg/dL (ref 0.3–0.9)
Total Bilirubin: 0.5 mg/dL (ref 0.3–1.2)
Total Protein: 7.2 g/dL (ref 6.5–8.1)

## 2018-08-14 LAB — I-STAT TROPONIN, ED: Troponin i, poc: 0 ng/mL (ref 0.00–0.08)

## 2018-08-14 LAB — I-STAT BETA HCG BLOOD, ED (MC, WL, AP ONLY): I-stat hCG, quantitative: <5 m[IU]/mL (ref <5)

## 2018-08-14 MED ORDER — IOPAMIDOL (ISOVUE-370) INJECTION 76%
INTRAVENOUS | Status: AC
  Filled 2018-08-14: qty 100

## 2018-08-14 MED ORDER — IOPAMIDOL (ISOVUE-370) INJECTION 76%
100.0000 mL | Freq: Once | INTRAVENOUS | Status: AC | PRN
  Administered 2018-08-14: 100 mL via INTRAVENOUS

## 2018-08-14 MED ORDER — SODIUM CHLORIDE (PF) 0.9 % IJ SOLN
INTRAMUSCULAR | Status: AC
  Filled 2018-08-14: qty 50

## 2018-08-14 NOTE — ED Triage Notes (Signed)
Patient c/o left chest pain that began approx 55 minutes ago. Patient also reports that she had nausea this AM. Patient also reports that she has a shooting stabbing pain on the left chest when she inhales.

## 2018-08-14 NOTE — Telephone Encounter (Signed)
See note

## 2018-08-14 NOTE — ED Provider Notes (Signed)
[facility] DEPT Provider Note   CSN: [id number] Arrival date & time: 08/14/18  1645     History   Chief Complaint Chief Complaint  Patient presents with  . Chest Pain    HPI Mackenzie Meyers is a 47 y.o. female.  Patient complains of left-sided chest pain worse with inspiration.  Patient has had a cough recently that seems to be getting better.  No shortness of breath  The history is provided by the patient. No language interpreter was used.  Chest Pain   This is a new problem. The current episode started more than 2 days ago. The problem occurs constantly. The problem has not changed since onset.The pain is associated with breathing. The pain is present in the lateral region. The pain is at a severity of 4/10. The pain is moderate. The quality of the pain is described as brief. The pain does not radiate. Pertinent negatives include no abdominal pain, no back pain, no cough and no headaches.  Pertinent negatives for past medical history include no seizures.    Past Medical History:  Diagnosis Date  . Anxiety    currently managed on CBD oil, was on medication briefly but made her OCD worse  . Arthritis    hands  . Breast mass, right 09/2012   removed because it was painful, was found to be calcified scar tissue  . Cervical dysplasia age 66  . GDM (gestational diabetes mellitus)    with last pregnancy  . Geographical tongue   . 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 History of kidney stones 10/2011  . 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 Hx of migraines   . OCD (obsessive compulsive disorder)    cleanliness with home and workspace  . Rosacea   . Sinus congestion 10/12/2012  . Sleep apnea 04/2017   patient is wearing C-pap  . Tobacco abuse 1989   max was 1.5 packs per [place]  . Vitamin D deficiency 10/2013   Value 29  . Vitiligo     Patient Active Problem List   Diagnosis Date  Noted  . Excessive daytime sleepiness 12/25/2017  . Hypersomnia, persistent 12/25/2017  . 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 Lumbar herniated disc 12/25/2017  . Obstructive sleep apnea treated with continuous positive airway pressure (CPAP) 12/08/2017  . Lumbar back pain with radiculopathy affecting left lower extremity 11/24/2017  . History of gestational diabetes mellitus 11/18/2017  . OCD (obsessive compulsive disorder)   . Hx of migraines   . Arthritis   . Sleep apnea 04/26/2017  . Vitamin D deficiency 10/24/2013  . History of kidney stones 10/25/2011  . Vitiligo   . Anxiety   . Tobacco abuse 08/27/1987    Past Surgical History:  Procedure Laterality Date  . ABDOMINOPLASTY  2008  . BREAST BIOPSY Right 10/15/2012   Procedure: BREAST BIOPSY;  Surgeon: Haywood Lasso, MD;  Location: [facility];  Service: General;  Laterality: Right;  removal right breast mass  . BREAST EXCISIONAL BIOPSY Right   . BREAST SURGERY     Lift  . CESAREAN SECTION  2004, 2007  . CESAREAN SECTION N/A 02/07/2015   Procedure: CESAREAN SECTION;  Surgeon: Jerelyn Charles, MD;  Location: [facility];  Service: Obstetrics;  Laterality: N/A;  EDD: 02/13/15   . dialation and cutterage    . GYNECOLOGIC CRYOSURGERY  age 62  . INTRAUTERINE DEVICE INSERTION  03/2015   Mirena at [facility]  . REDUCTION MAMMAPLASTY Bilateral   . TONSILLECTOMY  2000     OB History    Gravida  4   Para  3   Term  3   Preterm      AB  1   Living  3     SAB  1   TAB      Ectopic      Multiple  0   Live Births  1            Home Medications    Prior to Admission medications   Medication Sig Start Date End Date Taking? Authorizing Provider  Cyanocobalamin (VITAMIN B-12 IJ) Inject 1,000 mcg as directed every 30 (thirty) days.   Yes [provider]  ferrous sulfate 325 (65 FE) MG EC tablet Take 325 mg by mouth every other [place].   Yes [provider]  ibuprofen (ADVIL,MOTRIN) 200 MG  tablet Take 800 mg by mouth every 8 (eight) hours as needed. Patient takes 4 at a time    Yes [provider]  levonorgestrel (MIRENA) 20 MCG/24HR IUD 1 each by Intrauterine route once. Inserted by GYN on 03/2015 needs to be removed 03/2020.   Yes [provider]  mupirocin cream (BACTROBAN) 2 % Apply to affected area 1-2 times daily Patient taking differently: Apply 1 application topically 2 (two) times daily as needed (skin irritation). Apply to affected area 1-2 times daily as needed 07/28/18  Yes Morene Rankins, [place], PA  ranitidine (ZANTAC) 150 MG tablet TAKE 1 TABLET (150 MG TOTAL) BY MOUTH DAILY AT 12 NOON. 05/29/18  Yes Inda Coke, PA  Armodafinil 250 MG tablet Take 1 tablet (250 mg total) by mouth daily. 04/09/18   Dohmeier, Asencion Partridge, MD  azelastine (ASTELIN) 0.1 % nasal spray Place 2 sprays into both nostrils 2 (two) times daily. Use in each nostril as directed 05/07/18   Kennith Gain, MD  Carbinoxamine Maleate (RYVENT) 6 MG TABS Take 6 mg by mouth 2 (two) times daily. 05/07/18   Kennith Gain, MD  Fluticasone Propionate Truett Perna) 93 MCG/ACT EXHU Place 2 sprays into the nose 2 (two) times daily. 05/07/18   Kennith Gain, MD  nicotine (NICODERM CQ - DOSED IN MG/24 HOURS) 21 mg/24hr patch Place 1 patch (21 mg total) onto the skin daily. Patient not taking: Reported on 08/11/2018 06/24/18   Inda Coke, PA  rizatriptan (MAXALT-MLT) 10 MG disintegrating tablet TAKE 1 TABLET (10 MG TOTAL) BY MOUTH AS NEEDED FOR MIGRAINE. MAY REPEAT IN 2 HOURS IF NEEDED 06/08/18   Inda Coke, PA  sertraline (ZOLOFT) 50 MG tablet Take 1 tablet (50 mg total) by mouth daily. 08/11/18   Inda Coke, PA  tiZANidine (ZANAFLEX) 4 MG tablet Take 4 mg by mouth every 6 (six) hours as needed for muscle spasms.    [provider]  valACYclovir (VALTREX) 500 MG tablet Take 1 tablet (500 mg total) by mouth daily. Patient taking differently: Take 500 mg by  mouth daily as needed (fever blisters).  10/02/17   Fontaine, Belinda Block, MD  Vitamin D, Ergocalciferol, (DRISDOL) 1.25 MG (50000 UT) CAPS capsule TAKE 1 CAPSULE (50,000 UNITS TOTAL) BY MOUTH EVERY 7 (SEVEN) DAYS. 08/05/18   Inda Coke, PA    Family History Family History  Problem Relation Age of Onset  . Arthritis Mother   . High Cholesterol Mother   . High blood pressure Mother   . Hearing loss Father   .  High Cholesterol Father   . High blood pressure Father   . Cancer Maternal Grandfather        lymphoma  . Cancer Paternal Grandfather        liver  . Alcohol abuse Paternal Grandfather   . Lung cancer Maternal Grandmother        and maternal uncle; small cell   . Arthritis Maternal Grandmother   . Hyperlipidemia Maternal Grandmother   . Hypertension Maternal Grandmother   . Hearing loss Maternal Grandmother   . Cancer Maternal Uncle        Lung cancer  . Hyperlipidemia Other   . Asthma Brother   . Alcohol abuse Paternal Grandmother   . Hypertension Paternal Grandmother   . Eczema Son   . Colon cancer Neg Hx   . Breast cancer Neg Hx   . Allergic rhinitis Neg Hx   . Angioedema Neg Hx   . Urticaria Neg Hx     Social History Social History   Tobacco Use  . Smoking status: Current Every Hindes Smoker    Packs/Feider: 1.00    Last attempt to quit: 08/17/2012    Years since quitting: 5.9  . Smokeless tobacco: Never Used  . Tobacco comment: restarted 10/17 d/t husband losing job  Substance Use Topics  . Alcohol use: Yes    Comment: rare, twice a year  . Drug use: No     Allergies   Ampicillin; Dust mite extract; Erythromycin; Mold extract [trichophyton]; Other; Wine [alcohol]; and Ciprofloxacin   Review of Systems Review of Systems  Constitutional: Negative for appetite change and fatigue.  HENT: Negative for congestion, ear discharge and sinus pressure.   Eyes: Negative for discharge.  Respiratory: Negative for cough.   Cardiovascular: Positive for chest  pain.  Gastrointestinal: Negative for abdominal pain and diarrhea.  Genitourinary: Negative for frequency and hematuria.  Musculoskeletal: Negative for back pain.  Skin: Negative for rash.  Neurological: Negative for seizures and headaches.  Psychiatric/Behavioral: Negative for hallucinations.     Physical Exam Updated Vital Signs BP 128/82 (BP Location: Left Arm)   Pulse 83   Temp 98.1 F (36.7 C) (Oral)   Resp (!) 27   Ht 5\' 4"  (1.626 m)   Wt 84.8 kg   SpO2 97%   BMI 32.10 kg/m   Physical Exam Constitutional:      Appearance: She is well-developed.  HENT:     Head: Normocephalic.     Right Ear: Tympanic membrane normal.  Eyes:     General: No scleral icterus.    Conjunctiva/sclera: Conjunctivae normal.  Neck:     Musculoskeletal: Neck supple.     Thyroid: No thyromegaly.  Cardiovascular:     Rate and Rhythm: Normal rate and regular rhythm.     Heart sounds: No murmur. No friction rub. No gallop.      Comments: Tenderness to left chest Pulmonary:     Breath sounds: No stridor. No wheezing or rales.  Chest:     Chest wall: No tenderness.  Abdominal:     General: There is no distension.     Tenderness: There is no abdominal tenderness. There is no rebound.  Musculoskeletal: Normal range of motion.  Lymphadenopathy:     Cervical: No cervical adenopathy.  Skin:    Findings: No erythema or rash.  Neurological:     Mental Status: She is alert and oriented to person, place, and time.     Motor: No abnormal muscle tone.  Coordination: Coordination normal.  Psychiatric:        Behavior: Behavior normal.      ED Treatments / Results  Labs (all labs ordered are listed, but only abnormal results are displayed) Labs Reviewed  CBC - Abnormal; Notable for the following components:      Result Value   WBC 11.5 (*)    All other components within normal limits  D-DIMER, QUANTITATIVE (NOT AT [facility]) - Abnormal; Notable for the following components:   D-Dimer,  Quant 0.57 (*)    All other components within normal limits  BASIC METABOLIC PANEL  HEPATIC FUNCTION PANEL  I-STAT TROPONIN, ED  I-STAT BETA HCG BLOOD, ED ([facility], [facility], [facility] ONLY)    EKG EKG Interpretation  Date/Time:  Friday August 14 2018 16:52:27 EST Ventricular Rate:  86 PR Interval:    QRS Duration: 111 QT Interval:  360 QTC Calculation: 431 R Axis:   1 Text Interpretation:  Sinus rhythm Consider left atrial enlargement Low voltage, precordial leads Reconfirmed by Milton Ferguson [phone]) on 08/14/2018 8:48:07 PM   Radiology Dg Chest 2 View  Result Date: 08/14/2018 CLINICAL DATA:  Left chest pain beginning 2 hours ago. EXAM: CHEST - 2 VIEW COMPARISON:  None. FINDINGS: The heart size and mediastinal contours are within normal limits. Both lungs are clear. The visualized skeletal structures are unremarkable. IMPRESSION: No active cardiopulmonary disease. Electronically Signed   By: San Morelle M.D.   On: 08/14/2018 18:16   Ct Angio Chest Pe W And/or Wo Contrast  Result Date: 08/14/2018 CLINICAL DATA:  Left chest pain. EXAM: CT ANGIOGRAPHY CHEST WITH CONTRAST TECHNIQUE: Multidetector CT imaging of the chest was performed using the standard protocol during bolus administration of intravenous contrast. Multiplanar CT image reconstructions and MIPs were obtained to evaluate the vascular anatomy. CONTRAST:  116mL ISOVUE-370 IOPAMIDOL (ISOVUE-370) INJECTION 76% COMPARISON:  None. FINDINGS: Cardiovascular: The heart size is normal. No substantial pericardial effusion. No thoracic aortic aneurysm. No filling defect in the opacified pulmonary arteries to suggest the presence of an acute pulmonary embolus. Mediastinum/Nodes: No mediastinal lymphadenopathy. There is no hilar lymphadenopathy. The esophagus has normal imaging features. There is no axillary lymphadenopathy. Lungs/Pleura: The central tracheobronchial airways are patent. No focal airspace consolidation. No pulmonary edema or  pleural effusion. Mosaic attenuation in both lungs is nonspecific but may be related to air trapping. Upper Abdomen: 1.8 x 1.7 x 1.7 cm exophytic lesion at the upper pole left kidney (254/5) appears to enhance. Musculoskeletal: No worrisome lytic or sclerotic osseous abnormality. Review of the MIP images confirms the above findings. IMPRESSION: 1. No CT evidence for acute pulmonary embolus. 2. 1.7 cm exophytic lesion upper pole left kidney appears to enhance. Although incompletely visualized, appearance is highly concerning for renal cell carcinoma. Dedicated abdominal MRI with and without contrast recommended to further evaluate. Electronically Signed   By: Misty Stanley M.D.   On: 08/14/2018 19:27    Procedures Procedures (including critical care time)  Medications Ordered in ED Medications  sodium chloride (PF) 0.9 % injection (has no administration in time range)  iopamidol (ISOVUE-370) 76 % injection (has no administration in time range)  iopamidol (ISOVUE-370) 76 % injection 100 mL (100 mLs Intravenous Contrast Given 08/14/18 1912)     Initial Impression / Assessment and Plan / ED Course  I have reviewed the triage vital signs and the nursing notes.  Pertinent labs & imaging results that were available during my care of the patient were reviewed by me and considered in my  medical decision making (see chart for details).  Labs unremarkable except elevated d-dimer.  CT scan of the chest unremarkable in the lungs for any DVT.  I did find a lesion in her left kidney that she will get an MRI of her abdomen to evaluate further.  Patient with atypical chest pain suspect pleuritis with some chest wall tenderness.  Patient will take Motrin and follow-up with the PCP  Final Clinical Impressions(s) / ED Diagnoses   Final diagnoses:  Atypical chest pain    ED Discharge Orders    None       Milton Ferguson, MD 08/14/18 2051

## 2018-08-14 NOTE — Discharge Instructions (Addendum)
Take Tylenol or Motrin for your pain in your chest.  Contact your family doctor to arrange an MRI of your abdomen to look at the area of your left kidney

## 2018-08-14 NOTE — ED Notes (Signed)
RN stuck pt x2 without success

## 2018-08-14 NOTE — Telephone Encounter (Signed)
Pt reports left shoulder pain, sudden onset this afternoon. States worsens with deep breathing but is constant at 4/10. Pain is at front of shoulder, does not radiate.Reports nausea this am, no vomiting. Denies diaphoresis. Does not recall any injury, pain does not worsen with movement. States her daughter has been ill and "I've just been lying around for days." Pt sounds anxious, stating "My friend had these same symptoms and she had a heart attack." Pt directed to ED, states will follow disposition. Husband will drive.  Reason for Disposition . [1] Age > 40 AND [2] no obvious cause AND [3] pain even when not moving the arm    (Exception: pain is clearly made worse by moving arm or bending neck)  Answer Assessment - Initial Assessment Questions 1. ONSET: "When did the pain start?"     This afternoon 2. LOCATION: "Where is the pain located?"     Left shoulder at armpit 3. PAIN: "How bad is the pain?" (Scale 1-10; or mild, moderate, severe)   - MILD (1-3): doesn't interfere with normal activities   - MODERATE (4-7): interferes with normal activities (e.g., work or school) or awakens from sleep   - SEVERE (8-10): excruciating pain, unable to do any normal activities, unable to move arm at all due to pain     4/10 with deep breath 4. WORK OR EXERCISE: "Has there been any recent work or exercise that involved this part of the body?"     no 5. CAUSE: "What do you think is causing the shoulder pain?"     Unsure 6. OTHER SYMPTOMS: "Do you have any other symptoms?" (e.g., neck pain, swelling, rash, fever, numbness, weakness)     Left hand tingly, nauseated this am, stomach cramps, gassy. 7. PREGNANCY: "Is there any chance you are pregnant?" "When was your last menstrual period?"     no  Protocols used: SHOULDER PAIN-A-AH

## 2018-08-15 ENCOUNTER — Encounter: Payer: Self-pay | Admitting: Physician Assistant

## 2018-08-17 ENCOUNTER — Other Ambulatory Visit: Payer: Self-pay | Admitting: Physician Assistant

## 2018-08-17 ENCOUNTER — Ambulatory Visit (HOSPITAL_COMMUNITY)
Admission: RE | Admit: 2018-08-17 | Discharge: 2018-08-17 | Disposition: A | Discharge status: Home / Self Care | Payer: PRIVATE HEALTH INSURANCE | Source: Ambulatory Visit | Attending: Physician Assistant | Admitting: Physician Assistant

## 2018-08-17 ENCOUNTER — Encounter: Payer: Self-pay | Admitting: Physician Assistant

## 2018-08-17 ENCOUNTER — Ambulatory Visit (INDEPENDENT_AMBULATORY_CARE_PROVIDER_SITE_OTHER): Payer: PRIVATE HEALTH INSURANCE | Admitting: Physician Assistant

## 2018-08-17 VITALS — BP 110/66 | HR 81 | Temp 99.3°F | Ht 64.0 in | Wt 187.0 lb

## 2018-08-17 DIAGNOSIS — C642 Malignant neoplasm of left kidney, except renal pelvis: Secondary | ICD-10-CM

## 2018-08-17 DIAGNOSIS — N289 Disorder of kidney and ureter, unspecified: Secondary | ICD-10-CM | POA: Insufficient documentation

## 2018-08-17 MED ORDER — GADOBUTROL 1 MMOL/ML IV SOLN
8.0000 mL | Freq: Once | INTRAVENOUS | Status: AC | PRN
  Administered 2018-08-17: 8 mL via INTRAVENOUS

## 2018-08-17 NOTE — Telephone Encounter (Signed)
Noted. Pt seen today

## 2018-08-17 NOTE — Patient Instructions (Signed)
It was great to see you!  We will call you as soon as we secure an appointment for your MRI.   Donna's phone: [phone]  Take care,  Inda Coke PA-C

## 2018-08-17 NOTE — Progress Notes (Deleted)
ER visit follow up appointment.  I have reviewed the intake provided by my nurse or medical assistant today. I have reviewed the prior telephone transitional care contact documented in the patient's medical record.  I acted as a Education administrator for [employer], PA-C [employer], LPN  SUBJECTIVE    Mackenzie Meyers is a 47 y.o. female here today for transitional care hospital follow up visit:   Primary Problems Addressed During ER visit 1. Chief reason for visit was ***. 2. ER visit date was 08/14/2018. 3. Medical records from the ER are available for review. 3. The patient d/c ER for the following primary diagnoses:  ***  Medications (current list documented below) 1. New medications prescribed at  ER were not obtained by the patient. If not, the primary barrier is: *** 2. I have reviewed the medications with the patient or her caregiver. She or her caregiver {can/cannot:17900::"can"} confirm the names of the medications. 3. The patient is taking the correct medications.  Diagnostic Tests, Consultations, and Other Outstanding Issues 1. There were diagnostic tests still pending at the time of discharge. If applicable, these have been reviewed.  2. The patient does need additional tests or consults arranged. 3. Other issues needing attention today: {Response; none/wildcard:33079}.   Patient Empowerment and Education 1. The patient or her caregiver {can/cannot:17900::"can"} provide a brief explanation of the reason for ER visit and the nature of the care provided. 2. The patient or caregiver {can/cannot:17900::"can"} explain how self-care/care in the home should be provided. 3. The patient and/or caregiver know what signs or symptoms that would indicate deterioration of the patient's condition and what should be done to best avoid rehospitalization if this occurs? {yes no free text:20080::"Yes"} 4. Additional educational needs were met today as follows: {none:18576::"none needed"}   Home  Status 1. The patient has {has/has not had:19237::"has not had"} a significant decline in physical functioning since ER visit. The patient {Is/is not:9024} independent in performing ADL's. 2. The patient {has/has not had:19237::"has not had"} a significant decline in cognitive functioning as a result of hospitalization. 3. Home Health services {have/have not:19434} been set up for the patient. The patient {Is/is not:9024} receiving these services.   4. The patient {Is/is not:9024} receiving care management services.    Communication 1. I {have/have not:19434} communicated with other members of the patient's care team such as the PCP, specialty physicians, care management, home health agency, or allied health providers about the patient today. 2. I {have/have not:19434} communicated to the patient's pharmacy about discontinued medication(s).  Additional Notes Additional concerns addressed today include: ***   Current Medications @CMEDAMB @  ROS {ros master:310782}   Objective:    Vitals:   08/17/18 0748  BP: 110/66  Pulse: 81  Temp: 99.3 F (37.4 C)  SpO2: 97%      General: Alert, cooperative, appears stated age and no distress.  HEENT:  Normocephalic, without obvious abnormality, atraumatic. Conjunctivae/corneas clear. PERRL, EOM's intact. Normal TM's and external ear canals both ears. Nares normal. Septum midline. Mucosa normal. No drainage or sinus tenderness. Lips, mucosa, and tongue normal; teeth and gums normal.  Lungs: Clear to auscultation bilaterally.  Heart:: Regular rate and rhythm, S1, S2 normal, no murmur, click, rub or gallop.  Abdomen: Soft, non-tender; bowel sounds normal; no masses,  no organomegaly.  Extremities: Extremities normal, atraumatic, no cyanosis or edema.  Pulses: 2+ and symmetric.  Skin: Skin color, texture, turgor normal. No rashes or lesions.  Neurologic: Alert and oriented X 3, normal strength and tone.  Normal symmetric. reflexes. Normal  coordination and gait.  Psych: Alert,oriented, in NAD with a full range of affect, normal behavior and no psychotic features       Assessment/Plan:   There are no diagnoses linked to this encounter.   The following changes were made to the patient's medication list today: ***   Additional items needing attention at subsequent follow up visits include:***.   Patient and/or caregiver verbalized understanding and all questions were answered. Patient and/or caregiver is agreeable with the plan outlined above. No barriers to learning were identified.  ***  Inda Coke, PA-C

## 2018-08-17 NOTE — Progress Notes (Signed)
Mackenzie Meyers is a 47 y.o. female here for a new problem.   History of Present Illness:   Chief Complaint  Patient presents with  . ER Follow up    HPI   Patient went to the ER on Fri 08/14/18 for chest pain. Work-up included labs. CXR negative. Labs revealed slightly elevated D-dimer of 0.57, so a CT scan was performed. CT revealed as follows:  IMPRESSION: 1. No CT evidence for acute pulmonary embolus. 2. 1.7 cm exophytic lesion upper pole left kidney appears to enhance. Although incompletely visualized, appearance is highly concerning for renal cell carcinoma. Dedicated abdominal MRI with and without contrast recommended to further evaluate.  She is here to discuss obtaining further work-up of this.  Past Medical History:  Diagnosis Date  . Anxiety    currently managed on CBD oil, was on medication briefly but made her OCD worse  . Arthritis    hands  . Breast mass, right 09/2012   removed because it was painful, was found to be calcified scar tissue  . Cervical dysplasia age 30  . GDM (gestational diabetes mellitus)    with last pregnancy  . Geographical tongue   . 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 History of kidney stones 10/2011  . 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 Hx of migraines   . OCD (obsessive compulsive disorder)    cleanliness with home and workspace  . Rosacea   . Sinus congestion 10/12/2012  . Sleep apnea 04/2017   patient is wearing C-pap  . Tobacco abuse 1989   max was 1.5 packs per Jorge  . Vitamin D deficiency 10/2013   Value 29  . Vitiligo      Social History   Socioeconomic History  . Marital status: Married    Spouse name: Not on file  . Number of children: 3  . Years of education: Not on file  . Highest education level: Not on file  Occupational History  . Not on file  Social Needs  . Financial resource strain: Not on file  . Food insecurity:    Worry: Not on file     Inability: Not on file  . Transportation needs:    Medical: Not on file    Non-medical: Not on file  Tobacco Use  . Smoking status: Current Every [place] Smoker    Packs/Jordahl: 1.00    Last attempt to quit: 08/17/2012    Years since quitting: 6.0  . Smokeless tobacco: Never Used  . Tobacco comment: restarted 10/17 d/t husband losing job  Substance and Sexual Activity  . Alcohol use: Yes    Comment: rare, twice a year  . Drug use: No  . Sexual activity: Yes    Partners: Male    Birth control/protection: I.U.D.    Comment: Mirena inserted 03-2015-1st intercourse 47 yo-More than 5 partners  Lifestyle  . Physical activity:    Days per week: Not on file    Minutes per session: Not on file  . Stress: Not on file  Relationships  . Social connections:    Talks on phone: Not on file    Gets together: Not on file    Attends religious service: Not on file    Active member of club or organization: Not on file    Attends meetings of clubs or organizations: Not on file    Relationship status: Not on file  .  Intimate partner violence:    Fear of current or ex partner: Not on file    Emotionally abused: Not on file    Physically abused: Not on file    Forced sexual activity: Not on file  Other Topics Concern  . 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 ABDOMINOPLASTY  2008  . BREAST BIOPSY Right 10/15/2012   Procedure: BREAST BIOPSY;  Surgeon: Haywood Lasso, MD;  Location: [facility];  Service: General;  Laterality: Right;  removal right breast mass  . BREAST EXCISIONAL BIOPSY Right   . BREAST SURGERY     Lift  . CESAREAN SECTION  2004, 2007  . CESAREAN SECTION N/A 02/07/2015   Procedure: CESAREAN SECTION;  Surgeon: Jerelyn Charles, MD;  Location: [facility];  Service: Obstetrics;  Laterality: N/A;  EDD: 02/13/15   .  dialation and cutterage    . GYNECOLOGIC CRYOSURGERY  age 55  . INTRAUTERINE DEVICE INSERTION  03/2015   Mirena at [place]  . REDUCTION MAMMAPLASTY Bilateral   . TONSILLECTOMY  2000    Family History  Problem Relation Age of Onset  . Arthritis Mother   . High Cholesterol Mother   . High blood pressure Mother   . Hearing loss Father   . High Cholesterol Father   . High blood pressure Father   . Cancer Maternal Grandfather        lymphoma  . Cancer Paternal Grandfather        liver  . Alcohol abuse Paternal Grandfather   . Lung cancer Maternal Grandmother        and maternal uncle; small cell   . Arthritis Maternal Grandmother   . Hyperlipidemia Maternal Grandmother   . Hypertension Maternal Grandmother   . Hearing loss Maternal Grandmother   . Cancer Maternal Uncle        Lung cancer  . Hyperlipidemia Other   . Asthma Brother   . Alcohol abuse Paternal Grandmother   . Hypertension Paternal Grandmother   . Eczema Son   . Colon cancer Neg Hx   . Breast cancer Neg Hx   . Allergic rhinitis Neg Hx   . Angioedema Neg Hx   . Urticaria Neg Hx     Allergies  Allergen Reactions  . Ampicillin Hives  . Dust Mite Extract Cough  . Erythromycin Nausea And Vomiting  . Mold Extract [Trichophyton] Cough  . Other Hives    SAUERKRAUT  . Wine [Alcohol] Hives  . Ciprofloxacin Hives and Rash    Current Medications:   Current Outpatient Medications:  .  Armodafinil 250 MG tablet, Take 1 tablet (250 mg total) by mouth daily., Disp: 30 tablet, Rfl: 2 .  azelastine (ASTELIN) 0.1 % nasal spray, Place 2 sprays into both nostrils 2 (two) times daily. Use in each nostril as directed, Disp: 30 mL, Rfl: 5 .  Carbinoxamine Maleate (RYVENT) 6 MG TABS, Take 6 mg by mouth 2 (two) times daily., Disp: 60 tablet, Rfl: 5 .  Cyanocobalamin (VITAMIN B-12 IJ), Inject 1,000 mcg as directed every 30 (thirty) days., Disp: , Rfl:  .  ferrous sulfate 325 (65 FE) MG EC tablet, Take 325 mg by  mouth every other [place]., Disp: , Rfl:  .  Fluticasone Propionate (XHANCE) 93 MCG/ACT EXHU, Place 2  sprays into the nose 2 (two) times daily., Disp: 36 mL, Rfl: 5 .  ibuprofen (ADVIL,MOTRIN) 200 MG tablet, Take 800 mg by mouth every 8 (eight) hours as needed. Patient takes 4 at a time , Disp: , Rfl:  .  levonorgestrel (MIRENA) 20 MCG/24HR IUD, 1 each by Intrauterine route once. Inserted by GYN on 03/2015 needs to be removed 03/2020., Disp: , Rfl:  .  mupirocin cream (BACTROBAN) 2 %, Apply to affected area 1-2 times daily (Patient taking differently: Apply 1 application topically 2 (two) times daily as needed (skin irritation). Apply to affected area 1-2 times daily as needed), Disp: 15 g, Rfl: 0 .  nicotine (NICODERM CQ - DOSED IN MG/24 HOURS) 21 mg/24hr patch, Place 1 patch (21 mg total) onto the skin daily., Disp: 28 patch, Rfl: 3 .  ranitidine (ZANTAC) 150 MG tablet, TAKE 1 TABLET (150 MG TOTAL) BY MOUTH DAILY AT 12 NOON., Disp: 30 tablet, Rfl: 1 .  rizatriptan (MAXALT-MLT) 10 MG disintegrating tablet, TAKE 1 TABLET (10 MG TOTAL) BY MOUTH AS NEEDED FOR MIGRAINE. MAY REPEAT IN 2 HOURS IF NEEDED, Disp: 9 tablet, Rfl: 1 .  sertraline (ZOLOFT) 50 MG tablet, Take 1 tablet (50 mg total) by mouth daily., Disp: 30 tablet, Rfl: 1 .  tiZANidine (ZANAFLEX) 4 MG tablet, Take 4 mg by mouth every 6 (six) hours as needed for muscle spasms., Disp: , Rfl:  .  valACYclovir (VALTREX) 500 MG tablet, Take 1 tablet (500 mg total) by mouth daily. (Patient taking differently: Take 500 mg by mouth daily as needed (fever blisters). ), Disp: 90 tablet, Rfl: 4 .  Vitamin D, Ergocalciferol, (DRISDOL) 1.25 MG (50000 UT) CAPS capsule, TAKE 1 CAPSULE (50,000 UNITS TOTAL) BY MOUTH EVERY 7 (SEVEN) DAYS., Disp: 4 capsule, Rfl: 0   Review of Systems:   ROS  Negative unless otherwise specified per HPI.  Vitals:   Vitals:   08/17/18 0748  BP: 110/66  Pulse: 81  Temp: 99.3 F (37.4 C)  TempSrc: Oral  SpO2: 97%  Weight: 187 lb  (84.8 kg)  Height: 5\' 4"  (1.626 m)     Body mass index is 32.1 kg/m.  Physical Exam:   Physical Exam Vitals signs and nursing note reviewed.  Constitutional:      General: She is not in acute distress.    Appearance: She is well-developed. She is not ill-appearing or toxic-appearing.  Cardiovascular:     Rate and Rhythm: Normal rate and regular rhythm.     Pulses: Normal pulses.     Heart sounds: Normal heart sounds, S1 normal and S2 normal.     Comments: No LE edema Pulmonary:     Effort: Pulmonary effort is normal.     Breath sounds: Normal breath sounds.  Skin:    General: Skin is warm and dry.  Neurological:     Mental Status: She is alert.     GCS: GCS eye subscore is 4. GCS verbal subscore is 5. GCS motor subscore is 6.  Psychiatric:        Mood and Affect: Mood is anxious. Affect is tearful.        Speech: Speech normal.        Behavior: Behavior normal. Behavior is cooperative.      Assessment and Plan:   Danyele was seen today for er follow up.  Diagnoses and all orders for this visit:  Lesion of left native kidney -     MR Abdomen W Wo Contrast; Future  Will order stat MRI for further work-up.  Overall her mood is stable. Her husband is aware of her current health issues and is a great support system for him. Discussed that she should reach out to [place] if she needs anything in the interim.  . Reviewed expectations re: course of current medical issues. . Discussed self-management of symptoms. . Outlined signs and symptoms indicating need for more acute intervention. . Patient verbalized understanding and all questions were answered. . See orders for this visit as documented in the electronic medical record. . Patient received an After-Visit Summary.  CMA or LPN served as scribe during this visit. History, Physical, and Plan performed by medical provider. The above documentation has been reviewed and is accurate and complete.  Inda Coke, PA-C

## 2018-08-24 ENCOUNTER — Telehealth: Payer: Self-pay | Admitting: Physician Assistant

## 2018-08-24 ENCOUNTER — Other Ambulatory Visit: Payer: Self-pay | Admitting: Urology

## 2018-08-24 ENCOUNTER — Ambulatory Visit: Payer: PRIVATE HEALTH INSURANCE | Admitting: Physician Assistant

## 2018-08-24 NOTE — Telephone Encounter (Signed)
See note  Copied from [place] [id number]. Topic: General - Other >> Aug 24, 2018  1:40 PM Sheran Luz wrote: Reason for CRM: [name], from Cleon Gustin, M.D. - [facility] office, calling to inform PA Inda Coke that patient has been scheduled for surgery on Jan. 23. [name] states that [facility] will need medical clearance from PCP.  [name] that it was likely patient would need appointment and office would contact patient directly, if needed, to schedule. [name] is faxing over additional information regarding surgery. Please advise.

## 2018-08-26 ENCOUNTER — Other Ambulatory Visit: Payer: Self-pay | Admitting: Physician Assistant

## 2018-08-28 ENCOUNTER — Encounter: Payer: Self-pay | Admitting: Physician Assistant

## 2018-08-28 NOTE — Telephone Encounter (Signed)
Received papers from [facility]. Pt scheduled for Pre-op appt. 08/31/2018.

## 2018-08-31 ENCOUNTER — Ambulatory Visit (INDEPENDENT_AMBULATORY_CARE_PROVIDER_SITE_OTHER): Payer: PRIVATE HEALTH INSURANCE | Admitting: Physician Assistant

## 2018-08-31 ENCOUNTER — Encounter: Payer: Self-pay | Admitting: Physician Assistant

## 2018-08-31 VITALS — BP 106/58 | HR 68 | Temp 98.8°F | Ht 64.0 in | Wt 190.2 lb

## 2018-08-31 DIAGNOSIS — Z01818 Encounter for other preprocedural examination: Secondary | ICD-10-CM | POA: Diagnosis not present

## 2018-08-31 DIAGNOSIS — E538 Deficiency of other specified B group vitamins: Secondary | ICD-10-CM

## 2018-08-31 DIAGNOSIS — E559 Vitamin D deficiency, unspecified: Secondary | ICD-10-CM

## 2018-08-31 DIAGNOSIS — F419 Anxiety disorder, unspecified: Secondary | ICD-10-CM | POA: Diagnosis not present

## 2018-08-31 LAB — CBC WITH DIFFERENTIAL/PLATELET
Basophils Absolute: 0.1 10*3/uL (ref 0.0–0.1)
Basophils Relative: 0.8 % (ref 0.0–3.0)
Eosinophils Absolute: 0.2 10*3/uL (ref 0.0–0.7)
Eosinophils Relative: 2.3 % (ref 0.0–5.0)
HCT: 41.6 % (ref 36.0–46.0)
Hemoglobin: 14 g/dL (ref 12.0–15.0)
Lymphocytes Relative: 26.9 % (ref 12.0–46.0)
Lymphs Abs: 2.2 10*3/uL (ref 0.7–4.0)
MCHC: 33.6 g/dL (ref 30.0–36.0)
MCV: 87.9 fl (ref 78.0–100.0)
Monocytes Absolute: 0.5 10*3/uL (ref 0.1–1.0)
Monocytes Relative: 6.7 % (ref 3.0–12.0)
Neutro Abs: 5.2 10*3/uL (ref 1.4–7.7)
Neutrophils Relative %: 63.3 % (ref 43.0–77.0)
Platelets: 264 10*3/uL (ref 150.0–400.0)
RBC: 4.74 Mil/uL (ref 3.87–5.11)
RDW: 14.2 % (ref 11.5–15.5)
WBC: 8.2 10*3/uL (ref 4.0–10.5)

## 2018-08-31 LAB — VITAMIN B12: Vitamin B-12: 421 pg/mL (ref 211–911)

## 2018-08-31 LAB — FERRITIN: Ferritin: 83.5 ng/mL (ref 10.0–291.0)

## 2018-08-31 LAB — VITAMIN D 25 HYDROXY (VIT D DEFICIENCY, FRACTURES): VITD: 48.15 ng/mL (ref 30.00–100.00)

## 2018-08-31 NOTE — Patient Instructions (Signed)
It was great to see you!  We will be in touch with your lab results.  Take care,  Inda Coke PA-C

## 2018-08-31 NOTE — Progress Notes (Addendum)
Mackenzie Meyers is a 48 y.o. female is here for Pre-op Evaluation  I acted as a Education administrator for [employer], PA-C Anselmo Pickler, LPN  History of Present Illness:   Chief Complaint  Patient presents with  . Pre-op evaluation    HPI   Patient presents for pre-op evaluation. She is currently under the care of Dr. Cleon Gustin from [facility]. She is planning to have "[facility]" on 09/17/18 for removal of renal cell mass.   She is doing fantastic with working on smoking cessation. She is using the nicotine patch and has not had any cigarettes since New Years Eve (6 days.)  Cardiovascular ROS: No history of MI, stents or cardiac cath. Denies any current history of CP, SOB, DOE, PND, orthopnea, palpitations, dizziness, lightheadedness and syncope.  No arrhythmias or LE edema.  Mobility/Exercise tolerance: []   Poor/1 MET (Examples: can perform ADLs, walk indoors around house, walk on level ground at 2 mph, light housework, wash dishes) [x]   Fair/4 METs (Examples: Climb one flight of stairs, Walk on level ground at 4 mph, Run a short distance, Vacuum, lift furniture, Golf, doubles tennis, dancing) []   Excellent/10 METs (Examples: Swimming, Running, [employer], Basketball, Skiing)  No history of bleeding disorders.   There are no preventive care reminders to display for this patient.  Past Medical History:  Diagnosis Date  . Anxiety    currently managed on CBD oil, was on medication briefly but made her OCD worse  . Arthritis    hands  . Breast mass, right 09/2012   removed because it was painful, was found to be calcified scar tissue  . Cervical dysplasia age 27  . GDM (gestational diabetes mellitus)    with last pregnancy  . Geographical tongue   . 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 History of kidney stones 10/2011  . 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 Hx of  migraines   . OCD (obsessive compulsive disorder)    cleanliness with home and workspace  . Rosacea   . Sinus congestion 10/12/2012  . Sleep apnea 04/2017   patient is wearing C-pap  . Tobacco abuse 1989   max was 1.5 packs per [place]  . Vitamin D deficiency 10/2013   Value 29  . Vitiligo      Social History   Socioeconomic History  . Marital status: Married    Spouse name: Not on file  . Number of children: 3  . Years of education: Not on file  . Highest education level: Not on file  Occupational History  . Not on file  Social Needs  . Financial resource strain: Not on file  . Food insecurity:    Worry: Not on file    Inability: Not on file  . Transportation needs:    Medical: Not on file    Non-medical: Not on file  Tobacco Use  . Smoking status: Former Smoker    Packs/Houchen: 1.00    Types: Cigarettes    Last attempt to quit: 08/25/2018    Years since quitting: 0.0  . Smokeless tobacco: Never Used  . Tobacco comment: restarted 10/17 d/t husband losing job  Substance and Sexual Activity  . Alcohol use: Yes    Comment: rare, twice a year  . Drug use: No  . Sexual activity: Yes    Partners: Male    Birth control/protection: I.U.D.  Comment: Mirena inserted 03-2015-1st intercourse 48 yo-More than 5 partners  Lifestyle  . Physical activity:    Days per week: Not on file    Minutes per session: Not on file  . Stress: Not on file  Relationships  . Social connections:    Talks on phone: Not on file    Gets together: Not on file    Attends religious service: Not on file    Active member of club or organization: Not on file    Attends meetings of clubs or organizations: Not on file    Relationship status: Not on file  . Intimate partner violence:    Fear of current or ex partner: Not on file    Emotionally abused: Not on file    Physically abused: Not on file    Forced sexual activity: Not on file  Other Topics Concern  . Not on file  Social History Narrative    Homemaker   2 boys (70 and 73)   73.46 year old girl   Does Medieval recreator; now sews costumes    Past Surgical History:  Procedure Laterality Date  . ABDOMINOPLASTY  2008  . BREAST BIOPSY Right 10/15/2012   Procedure: BREAST BIOPSY;  Surgeon: Haywood Lasso, MD;  Location: [facility];  Service: General;  Laterality: Right;  removal right breast mass  . BREAST EXCISIONAL BIOPSY Right   . BREAST SURGERY     Lift  . CESAREAN SECTION  2004, 2007  . CESAREAN SECTION N/A 02/07/2015   Procedure: CESAREAN SECTION;  Surgeon: Jerelyn Charles, MD;  Location: [facility];  Service: Obstetrics;  Laterality: N/A;  EDD: 02/13/15   . dialation and cutterage    . GYNECOLOGIC CRYOSURGERY  age 16  . INTRAUTERINE DEVICE INSERTION  03/2015   Mirena at [place]  . REDUCTION MAMMAPLASTY Bilateral   . TONSILLECTOMY  2000    Family History  Problem Relation Age of Onset  . Arthritis Mother   . High Cholesterol Mother   . High blood pressure Mother   . Hearing loss Father   . High Cholesterol Father   . High blood pressure Father   . Cancer Maternal Grandfather        lymphoma  . Cancer Paternal Grandfather        liver  . Alcohol abuse Paternal Grandfather   . Lung cancer Maternal Grandmother        and maternal uncle; small cell   . Arthritis Maternal Grandmother   . Hyperlipidemia Maternal Grandmother   . Hypertension Maternal Grandmother   . Hearing loss Maternal Grandmother   . Cancer Maternal Uncle        Lung cancer  . Hyperlipidemia Other   . Asthma Brother   . Alcohol abuse Paternal Grandmother   . Hypertension Paternal Grandmother   . Eczema Son   . Colon cancer Neg Hx   . Breast cancer Neg Hx   . Allergic rhinitis Neg Hx   . Angioedema Neg Hx   . Urticaria Neg Hx     PMHx, SurgHx, SocialHx, FamHx, Medications, and Allergies were reviewed in the Visit Navigator and updated as appropriate.   Patient Active Problem List   Diagnosis Date Noted  .  Excessive daytime sleepiness 12/25/2017  . Hypersomnia, persistent 12/25/2017  . 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 Lumbar herniated disc 12/25/2017  . Obstructive sleep apnea treated with continuous positive airway pressure (CPAP) 12/08/2017  . Lumbar back pain with radiculopathy affecting left lower  extremity 11/24/2017  . History of gestational diabetes mellitus 11/18/2017  . OCD (obsessive compulsive disorder)   . Hx of migraines   . Arthritis   . Sleep apnea 04/26/2017  . Vitamin D deficiency 10/24/2013  . History of kidney stones 10/25/2011  . Vitiligo   . Anxiety   . Tobacco abuse 08/27/1987    Social History   Tobacco Use  . Smoking status: Former Smoker    Packs/Shreiner: 1.00    Types: Cigarettes    Last attempt to quit: 08/25/2018    Years since quitting: 0.0  . Smokeless tobacco: Never Used  . Tobacco comment: restarted 10/17 d/t husband losing job  Substance Use Topics  . Alcohol use: Yes    Comment: rare, twice a year  . Drug use: No    Current Medications and Allergies:    Current Outpatient Medications:  .  Armodafinil 250 MG tablet, Take 1 tablet (250 mg total) by mouth daily., Disp: 30 tablet, Rfl: 2 .  azelastine (ASTELIN) 0.1 % nasal spray, Place 2 sprays into both nostrils 2 (two) times daily. Use in each nostril as directed, Disp: 30 mL, Rfl: 5 .  Carbinoxamine Maleate (RYVENT) 6 MG TABS, Take 6 mg by mouth 2 (two) times daily., Disp: 60 tablet, Rfl: 5 .  Cyanocobalamin (VITAMIN B-12 IJ), Inject 1,000 mcg as directed every 30 (thirty) days., Disp: , Rfl:  .  ferrous sulfate 325 (65 FE) MG EC tablet, Take 325 mg by mouth every other [place]., Disp: , Rfl:  .  Fluticasone Propionate (XHANCE) 93 MCG/ACT EXHU, Place 2 sprays into the nose 2 (two) times daily., Disp: 36 mL, Rfl: 5 .  ibuprofen (ADVIL,MOTRIN) 200 MG tablet, Take 800 mg by mouth every 8 (eight) hours as needed. Patient takes 4 at a time , Disp: , Rfl:  .  levonorgestrel (MIRENA) 20 MCG/24HR IUD, 1 each by  Intrauterine route once. Inserted by GYN on 03/2015 needs to be removed 03/2020., Disp: , Rfl:  .  mupirocin cream (BACTROBAN) 2 %, Apply to affected area 1-2 times daily (Patient taking differently: Apply 1 application topically 2 (two) times daily as needed (skin irritation). Apply to affected area 1-2 times daily as needed), Disp: 15 g, Rfl: 0 .  nicotine (NICODERM CQ - DOSED IN MG/24 HOURS) 21 mg/24hr patch, Place 1 patch (21 mg total) onto the skin daily., Disp: 28 patch, Rfl: 3 .  ranitidine (ZANTAC) 150 MG tablet, TAKE 1 TABLET (150 MG TOTAL) BY MOUTH DAILY AT 12 NOON., Disp: 30 tablet, Rfl: 1 .  rizatriptan (MAXALT-MLT) 10 MG disintegrating tablet, TAKE 1 TABLET (10 MG TOTAL) BY MOUTH AS NEEDED FOR MIGRAINE. MAY REPEAT IN 2 HOURS IF NEEDED, Disp: 9 tablet, Rfl: 1 .  sertraline (ZOLOFT) 50 MG tablet, Take 1 tablet (50 mg total) by mouth daily., Disp: 30 tablet, Rfl: 1 .  tiZANidine (ZANAFLEX) 4 MG tablet, Take 4 mg by mouth every 6 (six) hours as needed for muscle spasms., Disp: , Rfl:  .  valACYclovir (VALTREX) 500 MG tablet, Take 1 tablet (500 mg total) by mouth daily. (Patient taking differently: Take 500 mg by mouth daily as needed (fever blisters). ), Disp: 90 tablet, Rfl: 4 .  Vitamin D, Ergocalciferol, (DRISDOL) 1.25 MG (50000 UT) CAPS capsule, TAKE 1 CAPSULE (50,000 UNITS TOTAL) BY MOUTH EVERY 7 (SEVEN) DAYS., Disp: 4 capsule, Rfl: 0   Allergies  Allergen Reactions  . Ampicillin Hives  . Dust Mite Extract Cough  . Erythromycin Nausea And  Vomiting  . Mold Extract [Trichophyton] Cough  . Other Hives    SAUERKRAUT  . Wine [Alcohol] Hives  . Ciprofloxacin Hives and Rash    Review of Systems   ROS  Negative unless otherwise specified per HPI.  Vitals:   Vitals:   08/31/18 1024  BP: (!) 106/58  Pulse: 68  Temp: 98.8 F (37.1 C)  TempSrc: Oral  SpO2: 97%  Weight: 190 lb 4 oz (86.3 kg)  Height: 5' 4"  (1.626 m)     Body mass index is 32.66 kg/m.   Physical Exam:     Physical Exam Vitals signs and nursing note reviewed.  Constitutional:      General: She is not in acute distress.    Appearance: She is well-developed. She is not ill-appearing or toxic-appearing.  Cardiovascular:     Rate and Rhythm: Normal rate and regular rhythm.     Pulses: Normal pulses.     Heart sounds: Normal heart sounds, S1 normal and S2 normal.     Comments: No LE edema Pulmonary:     Effort: Pulmonary effort is normal.     Breath sounds: Normal breath sounds.  Skin:    General: Skin is warm and dry.  Neurological:     Mental Status: She is alert.     GCS: GCS eye subscore is 4. GCS verbal subscore is 5. GCS motor subscore is 6.  Psychiatric:        Speech: Speech normal.        Behavior: Behavior normal. Behavior is cooperative.    EKG tracing is personally reviewed.  EKG notes NSR.  No acute changes.   Results for orders placed or performed in visit on 08/31/18  CBC with Differential/Platelet  Result Value Ref Range   WBC 8.2 4.0 - 10.5 K/uL   RBC 4.74 3.87 - 5.11 Mil/uL   Hemoglobin 14.0 12.0 - 15.0 g/dL   HCT 41.6 36.0 - 46.0 %   MCV 87.9 78.0 - 100.0 fl   MCHC 33.6 30.0 - 36.0 g/dL   RDW 14.2 11.5 - 15.5 %   Platelets 264.0 150.0 - 400.0 K/uL   Neutrophils Relative % 63.3 43.0 - 77.0 %   Lymphocytes Relative 26.9 12.0 - 46.0 %   Monocytes Relative 6.7 3.0 - 12.0 %   Eosinophils Relative 2.3 0.0 - 5.0 %   Basophils Relative 0.8 0.0 - 3.0 %   Neutro Abs 5.2 1.4 - 7.7 K/uL   Lymphs Abs 2.2 0.7 - 4.0 K/uL   Monocytes Absolute 0.5 0.1 - 1.0 K/uL   Eosinophils Absolute 0.2 0.0 - 0.7 K/uL   Basophils Absolute 0.1 0.0 - 0.1 K/uL  Ferritin  Result Value Ref Range   Ferritin 83.5 10.0 - 291.0 ng/mL  VITAMIN D 25 Hydroxy (Vit-D Deficiency, Fractures)  Result Value Ref Range   VITD 48.15 30.00 - 100.00 ng/mL  Vitamin B12  Result Value Ref Range   Vitamin B-12 421 211 - 911 pg/mL    Assessment and Plan:    Mackenzie Meyers was seen today for pre-op  evaluation.  Diagnoses and all orders for this visit:  Pre-op evaluation Pre-operative assessment: No contraindications to planned surgery at this time. Will forward note to [facility], appreciate coordination of care. -     EKG 12-Lead  Anxiety Continue Zoloft 50 mg daily. Follow-up in 1 month.  B12 deficiency -     Vitamin B12  Vitamin D deficiency -     VITAMIN D 25 Hydroxy (  Vit-D Deficiency, Fractures)  . Reviewed expectations re: course of current medical issues. . Discussed self-management of symptoms. . Outlined signs and symptoms indicating need for more acute intervention. . Patient verbalized understanding and all questions were answered. . See orders for this visit as documented in the electronic medical record. . Patient received an After Visit Summary.  CMA or LPN served as scribe during this visit. History, Physical, and Plan performed by medical provider. The above documentation has been reviewed and is accurate and complete.  Inda Coke, PA-C [place] [place] 08/31/2018  Follow-up: Return in about 1 month (around 10/01/2018) for mood and tobacco cessation.

## 2018-09-01 ENCOUNTER — Other Ambulatory Visit: Payer: Self-pay | Admitting: Physician Assistant

## 2018-09-01 NOTE — Telephone Encounter (Signed)
Surgical clearance documentation faxed to [facility].

## 2018-09-09 ENCOUNTER — Encounter (HOSPITAL_COMMUNITY): Payer: Self-pay

## 2018-09-09 NOTE — Progress Notes (Signed)
Clearance 08-31-18 Inda Coke PA  ekg 08-31-18 epic  cxr 08-14-18 epic  [facility] chest 08-14-18 epic

## 2018-09-09 NOTE — Patient Instructions (Addendum)
Mackenzie Meyers  09/09/2018   Your procedure is scheduled on: 09-17-18  Report to [facility] Main  Entrance  Report to admitting at            0530    AM    Call this number if you have problems the morning of surgery [phone]                  Follow a clear liquid diet the Nucci before surgery              And drink one bottle of magnesium citrate by noon the Heacox before surgery     CLEAR LIQUID DIET   Foods Allowed                                                                     Foods Excluded  Coffee and tea, regular and decaf                             liquids that you cannot  Plain Jell-O in any flavor                                             see through such as: Fruit ices (not with fruit pulp)                                     milk, soups, orange juice  Iced Popsicles                                    All solid food Carbonated beverages, regular and diet                                    Cranberry, grape and apple juices Sports drinks like Gatorade Lightly seasoned clear broth or consume(fat free) Sugar, honey syrup  Sample Menu Breakfast                                Lunch                                     Supper Cranberry juice                    Beef broth                            Chicken broth Jell-O  Grape juice                           Apple juice Coffee or tea                        Jell-O                                      Popsicle                                                Coffee or tea                        Coffee or tea  _____________________________________________________________________      Remember: Do not eat food or drink liquids :After Midnight.   BRUSH YOUR TEETH MORNING OF SURGERY AND RINSE YOUR MOUTH OUT, NO CHEWING GUM CANDY OR MINTS.     Take these medicines the morning of surgery with A SIP OF WATER: zoloft, inhaler and bring, carbinoxamine( ryvent)                            You may not have any metal on your body including hair pins and              piercings  Do not wear jewelry, make-up, lotions, powders or perfumes, deodorant             Do not wear nail polish.  Do not shave  48 hours prior to surgery.                Do not bring valuables to the hospital. [place].  Contacts, dentures or bridgework may not be worn into surgery.  Leave suitcase in the car. After surgery it may be brought to your room.                Please read over the following fact sheets you were given: _____________________________________________________________________           [facility] - Preparing for Surgery Before surgery, you can play an important role.  Because skin is not sterile, your skin needs to be as free of germs as possible.  You can reduce the number of germs on your skin by washing with CHG (chlorahexidine gluconate) soap before surgery.  CHG is an antiseptic cleaner which kills germs and bonds with the skin to continue killing germs even after washing. Please DO NOT use if you have an allergy to CHG or antibacterial soaps.  If your skin becomes reddened/irritated stop using the CHG and inform your nurse when you arrive at Short Stay. Do not shave (including legs and underarms) for at least 48 hours prior to the first CHG shower.  You may shave your face/neck. Please follow these instructions carefully:  1.  Shower with CHG Soap the night before surgery and the  morning of Surgery.  2.  If you choose to wash your hair, wash your hair first as usual with your  normal  shampoo.  3.  After you shampoo, rinse your hair and body thoroughly to remove the  shampoo.                           4.  Use CHG as you would any other liquid soap.  You can apply chg directly  to the skin and wash                       Gently with a scrungie or clean washcloth.  5.  Apply the CHG Soap to your body ONLY FROM  THE NECK DOWN.   Do not use on face/ open                           Wound or open sores. Avoid contact with eyes, ears mouth and genitals (private parts).                       Wash face,  Genitals (private parts) with your normal soap.             6.  Wash thoroughly, paying special attention to the area where your surgery  will be performed.  7.  Thoroughly rinse your body with warm water from the neck down.  8.  DO NOT shower/wash with your normal soap after using and rinsing off  the CHG Soap.                9.  Pat yourself dry with a clean towel.            10.  Wear clean pajamas.            11.  Place clean sheets on your bed the night of your first shower and do not  sleep with pets. [place] of Surgery : Do not apply any lotions/deodorants the morning of surgery.  Please wear clean clothes to the hospital/surgery center.  FAILURE TO FOLLOW THESE INSTRUCTIONS MAY RESULT IN THE CANCELLATION OF YOUR SURGERY PATIENT SIGNATURE_________________________________  NURSE SIGNATURE__________________________________  ________________________________________________________________________

## 2018-09-10 ENCOUNTER — Encounter (HOSPITAL_COMMUNITY)
Admission: RE | Admit: 2018-09-10 | Discharge: 2018-09-10 | Disposition: A | Discharge status: Home / Self Care | Payer: PRIVATE HEALTH INSURANCE | Source: Ambulatory Visit | Attending: Urology | Admitting: Urology

## 2018-09-10 ENCOUNTER — Encounter (HOSPITAL_COMMUNITY): Payer: Self-pay

## 2018-09-10 ENCOUNTER — Other Ambulatory Visit: Payer: Self-pay

## 2018-09-10 ENCOUNTER — Ambulatory Visit (INDEPENDENT_AMBULATORY_CARE_PROVIDER_SITE_OTHER): Payer: PRIVATE HEALTH INSURANCE

## 2018-09-10 DIAGNOSIS — Z01812 Encounter for preprocedural laboratory examination: Secondary | ICD-10-CM | POA: Insufficient documentation

## 2018-09-10 DIAGNOSIS — E538 Deficiency of other specified B group vitamins: Secondary | ICD-10-CM | POA: Diagnosis not present

## 2018-09-10 DIAGNOSIS — N2889 Other specified disorders of kidney and ureter: Secondary | ICD-10-CM | POA: Diagnosis not present

## 2018-09-10 HISTORY — DX: Malignant (primary) neoplasm, unspecified: C80.1

## 2018-09-10 HISTORY — DX: Family history of other specified conditions: Z84.89

## 2018-09-10 HISTORY — DX: Adverse effect of unspecified anesthetic, initial encounter: T41.45XA

## 2018-09-10 HISTORY — DX: Gestational diabetes mellitus in pregnancy, unspecified control: O24.419

## 2018-09-10 HISTORY — DX: Other complications of anesthesia, initial encounter: T88.59XA

## 2018-09-10 LAB — CBC
HCT: 45.2 % (ref 36.0–46.0)
Hemoglobin: 14.4 g/dL (ref 12.0–15.0)
MCH: 29 pg (ref 26.0–34.0)
MCHC: 31.9 g/dL (ref 30.0–36.0)
MCV: 91.1 fL (ref 80.0–100.0)
Platelets: 301 10*3/uL (ref 150–400)
RBC: 4.96 MIL/uL (ref 3.87–5.11)
RDW: 13.1 % (ref 11.5–15.5)
WBC: 12 10*3/uL — ABNORMAL HIGH (ref 4.0–10.5)
nRBC: 0 % (ref 0.0–0.2)

## 2018-09-10 LAB — BASIC METABOLIC PANEL
Anion gap: 8 (ref 5–15)
BUN: 14 mg/dL (ref 6–20)
CO2: 27 mmol/L (ref 22–32)
Calcium: 9.6 mg/dL (ref 8.9–10.3)
Chloride: 103 mmol/L (ref 98–111)
Creatinine, Ser: 0.73 mg/dL (ref 0.44–1.00)
GFR calc Af Amer: >60 mL/min (ref >60)
GFR calc non Af Amer: >60 mL/min (ref >60)
Glucose, Bld: 99 mg/dL (ref 70–99)
Potassium: 4.1 mmol/L (ref 3.5–5.1)
Sodium: 138 mmol/L (ref 135–145)

## 2018-09-10 MED ORDER — CYANOCOBALAMIN 1000 MCG/ML IJ SOLN
1000.0000 ug | Freq: Once | INTRAMUSCULAR | Status: AC
  Administered 2018-09-10: 1000 ug via INTRAMUSCULAR

## 2018-09-10 NOTE — Progress Notes (Signed)
Per orders of Inda Coke, [place] , injection of B-12 given by Francella Solian in left deltoid. Patient tolerated injection well. She will make an appointment for next one.

## 2018-09-14 ENCOUNTER — Telehealth: Payer: Self-pay | Admitting: Neurology

## 2018-09-14 NOTE — Telephone Encounter (Signed)
Answered questions, can take up to 72 hrs before hearing back

## 2018-09-14 NOTE — Telephone Encounter (Signed)
armodafinil is approved until 03/15/2019 [id number] through [employer]

## 2018-09-14 NOTE — Telephone Encounter (Signed)
PA completed through cover my meds/ [employer]. TYV:[id number] Waiting for clinical questions.

## 2018-09-16 NOTE — Anesthesia Preprocedure Evaluation (Addendum)
Anesthesia Evaluation  Patient identified by MRN, date of birth, ID band Patient awake  General Assessment Comment:Crys when she wakes up from  Reviewed: Allergy & Precautions, NPO status , Patient's Chart, lab work & pertinent test results  Airway Mallampati: II  TM Distance: >3 FB Neck ROM: Full    Dental no notable dental hx. (+) Teeth Intact, Dental Advisory Given   Pulmonary sleep apnea , former smoker,    Pulmonary exam normal breath sounds clear to auscultation       Cardiovascular negative cardio ROS Normal cardiovascular exam Rhythm:Regular Rate:Normal  EKG 08/31/2018 SR R 60   Neuro/Psych PSYCHIATRIC DISORDERS Anxiety  Neuromuscular disease    GI/Hepatic negative GI ROS, Neg liver ROS,   Endo/Other  diabetes, Gestational  Renal/GU negative Renal ROS     Musculoskeletal   Abdominal (+) + obese,   Peds  Hematology   Anesthesia Other Findings   Reproductive/Obstetrics                          Lab Results  Component Value Date   WBC 12.0 (H) 09/10/2018   HGB 14.4 09/10/2018   HCT 45.2 09/10/2018   MCV 91.1 09/10/2018   PLT 301 09/10/2018    Lab Results  Component Value Date   WBC 12.0 (H) 09/10/2018   HGB 14.4 09/10/2018   HCT 45.2 09/10/2018   MCV 91.1 09/10/2018   PLT 301 09/10/2018    Anesthesia Physical Anesthesia Plan  ASA: III  Anesthesia Plan: General   Post-op Pain Management:    Induction: Intravenous  PONV Risk Score and Plan: 4 or greater and Treatment may vary due to age or medical condition, Ondansetron, Dexamethasone, Scopolamine patch - Pre-op and Midazolam  Airway Management Planned: Oral ETT  Additional Equipment:   Intra-op Plan:   Post-operative Plan: Extubation in OR  Informed Consent: I have reviewed the patients History and Physical, chart, labs and discussed the procedure including the risks, benefits and alternatives for the proposed  anesthesia with the patient or authorized representative who has indicated his/her understanding and acceptance.     Dental advisory given  Plan Discussed with:   Anesthesia Plan Comments: (Pt w Renal Cell CA)       Anesthesia Quick Evaluation

## 2018-09-17 ENCOUNTER — Encounter (HOSPITAL_COMMUNITY)
Admission: RE | Disposition: A | Discharge status: Home / Self Care | Payer: Self-pay | Source: Other Acute Inpatient Hospital | Attending: Urology

## 2018-09-17 ENCOUNTER — Ambulatory Visit (HOSPITAL_COMMUNITY): Payer: PRIVATE HEALTH INSURANCE | Admitting: Certified Registered"

## 2018-09-17 ENCOUNTER — Other Ambulatory Visit: Payer: Self-pay

## 2018-09-17 ENCOUNTER — Ambulatory Visit (HOSPITAL_COMMUNITY): Payer: PRIVATE HEALTH INSURANCE | Admitting: Physician Assistant

## 2018-09-17 ENCOUNTER — Observation Stay (HOSPITAL_COMMUNITY)
Admission: RE | Admit: 2018-09-17 | Discharge: 2018-09-20 | Disposition: A | Discharge status: Home / Self Care | Payer: PRIVATE HEALTH INSURANCE | Source: Other Acute Inpatient Hospital | Attending: Urology | Admitting: Urology

## 2018-09-17 ENCOUNTER — Encounter (HOSPITAL_COMMUNITY): Payer: Self-pay

## 2018-09-17 DIAGNOSIS — N2889 Other specified disorders of kidney and ureter: Secondary | ICD-10-CM | POA: Diagnosis present

## 2018-09-17 DIAGNOSIS — R0902 Hypoxemia: Secondary | ICD-10-CM

## 2018-09-17 DIAGNOSIS — G4730 Sleep apnea, unspecified: Secondary | ICD-10-CM | POA: Insufficient documentation

## 2018-09-17 DIAGNOSIS — Z23 Encounter for immunization: Secondary | ICD-10-CM | POA: Diagnosis not present

## 2018-09-17 DIAGNOSIS — Z87891 Personal history of nicotine dependence: Secondary | ICD-10-CM | POA: Diagnosis not present

## 2018-09-17 DIAGNOSIS — C642 Malignant neoplasm of left kidney, except renal pelvis: Secondary | ICD-10-CM | POA: Diagnosis not present

## 2018-09-17 DIAGNOSIS — Z9989 Dependence on other enabling machines and devices: Secondary | ICD-10-CM | POA: Insufficient documentation

## 2018-09-17 DIAGNOSIS — F419 Anxiety disorder, unspecified: Secondary | ICD-10-CM | POA: Insufficient documentation

## 2018-09-17 HISTORY — PX: ROBOTIC ASSITED PARTIAL NEPHRECTOMY: SHX6087

## 2018-09-17 LAB — HEMOGLOBIN AND HEMATOCRIT, BLOOD
HCT: 43.5 % (ref 36.0–46.0)
Hemoglobin: 13.6 g/dL (ref 12.0–15.0)

## 2018-09-17 SURGERY — NEPHRECTOMY, PARTIAL, ROBOT-ASSISTED
Anesthesia: General | Laterality: Left

## 2018-09-17 MED ORDER — ROCURONIUM BROMIDE 10 MG/ML (PF) SYRINGE
PREFILLED_SYRINGE | INTRAVENOUS | Status: DC | PRN
  Administered 2018-09-17: 10 mg via INTRAVENOUS
  Administered 2018-09-17: 50 mg via INTRAVENOUS

## 2018-09-17 MED ORDER — LACTATED RINGERS IV SOLN
INTRAVENOUS | Status: DC
  Administered 2018-09-17: 06:00:00 via INTRAVENOUS

## 2018-09-17 MED ORDER — DEXAMETHASONE SODIUM PHOSPHATE 10 MG/ML IJ SOLN
INTRAMUSCULAR | Status: AC
  Filled 2018-09-17: qty 1

## 2018-09-17 MED ORDER — KETOROLAC TROMETHAMINE 30 MG/ML IJ SOLN
30.0000 mg | Freq: Once | INTRAMUSCULAR | Status: AC | PRN
  Administered 2018-09-17: 30 mg via INTRAVENOUS

## 2018-09-17 MED ORDER — PROPOFOL 10 MG/ML IV BOLUS
INTRAVENOUS | Status: DC | PRN
  Administered 2018-09-17: 170 mg via INTRAVENOUS

## 2018-09-17 MED ORDER — MIDAZOLAM HCL 2 MG/2ML IJ SOLN
INTRAMUSCULAR | Status: DC | PRN
  Administered 2018-09-17: 2 mg via INTRAVENOUS

## 2018-09-17 MED ORDER — HYDROCODONE-ACETAMINOPHEN 5-325 MG PO TABS
1.0000 | ORAL_TABLET | ORAL | Status: DC | PRN
  Administered 2018-09-17 (×2): 1 via ORAL
  Administered 2018-09-18 (×3): 2 via ORAL
  Administered 2018-09-18: 1 via ORAL
  Administered 2018-09-19 – 2018-09-20 (×5): 2 via ORAL
  Filled 2018-09-17 (×8): qty 2
  Filled 2018-09-17: qty 1
  Filled 2018-09-17: qty 2

## 2018-09-17 MED ORDER — MEPERIDINE HCL 50 MG/ML IJ SOLN: 6.2500 mg | INTRAMUSCULAR | Status: DC | PRN

## 2018-09-17 MED ORDER — EPHEDRINE 5 MG/ML INJ
INTRAVENOUS | Status: AC
  Filled 2018-09-17: qty 10

## 2018-09-17 MED ORDER — PHENYLEPHRINE 40 MCG/ML (10ML) SYRINGE FOR IV PUSH (FOR BLOOD PRESSURE SUPPORT)
PREFILLED_SYRINGE | INTRAVENOUS | Status: DC | PRN
  Administered 2018-09-17 (×2): 40 ug via INTRAVENOUS

## 2018-09-17 MED ORDER — LIDOCAINE 2% (20 MG/ML) 5 ML SYRINGE
INTRAMUSCULAR | Status: DC | PRN
  Administered 2018-09-17: 60 mg via INTRAVENOUS

## 2018-09-17 MED ORDER — FENTANYL CITRATE (PF) 250 MCG/5ML IJ SOLN
INTRAMUSCULAR | Status: DC | PRN
  Administered 2018-09-17 (×2): 50 ug via INTRAVENOUS
  Administered 2018-09-17: 100 ug via INTRAVENOUS
  Administered 2018-09-17: 50 ug via INTRAVENOUS

## 2018-09-17 MED ORDER — ONDANSETRON HCL 4 MG/2ML IJ SOLN: 4.0000 mg | Freq: Once | INTRAMUSCULAR | Status: DC | PRN

## 2018-09-17 MED ORDER — PROPOFOL 10 MG/ML IV BOLUS
INTRAVENOUS | Status: AC
  Filled 2018-09-17: qty 40

## 2018-09-17 MED ORDER — STERILE WATER FOR IRRIGATION IR SOLN
Status: DC | PRN
  Administered 2018-09-17: 1000 mL

## 2018-09-17 MED ORDER — BUPIVACAINE LIPOSOME 1.3 % IJ SUSP
20.0000 mL | Freq: Once | INTRAMUSCULAR | Status: AC
  Administered 2018-09-17: 20 mL
  Filled 2018-09-17: qty 20

## 2018-09-17 MED ORDER — SUGAMMADEX SODIUM 200 MG/2ML IV SOLN
INTRAVENOUS | Status: AC
  Filled 2018-09-17: qty 2

## 2018-09-17 MED ORDER — ACETAMINOPHEN 500 MG PO TABS
1000.0000 mg | ORAL_TABLET | Freq: Once | ORAL | Status: AC
  Administered 2018-09-17: 1000 mg via ORAL
  Filled 2018-09-17: qty 2

## 2018-09-17 MED ORDER — BELLADONNA ALKALOIDS-OPIUM 16.2-60 MG RE SUPP: 1.0000 | Freq: Four times a day (QID) | RECTAL | Status: DC | PRN

## 2018-09-17 MED ORDER — CEFAZOLIN SODIUM-DEXTROSE 2-4 GM/100ML-% IV SOLN
2.0000 g | INTRAVENOUS | Status: AC
  Administered 2018-09-17: 2 g via INTRAVENOUS
  Filled 2018-09-17: qty 100

## 2018-09-17 MED ORDER — MIDAZOLAM HCL 2 MG/2ML IJ SOLN
INTRAMUSCULAR | Status: AC
  Filled 2018-09-17: qty 2

## 2018-09-17 MED ORDER — ACETAMINOPHEN 325 MG PO TABS
650.0000 mg | ORAL_TABLET | ORAL | Status: DC | PRN
  Administered 2018-09-18: 650 mg via ORAL
  Filled 2018-09-17: qty 2

## 2018-09-17 MED ORDER — KETAMINE HCL 10 MG/ML IJ SOLN
INTRAMUSCULAR | Status: DC | PRN
  Administered 2018-09-17: 40 mg via INTRAVENOUS

## 2018-09-17 MED ORDER — DEXTROSE-NACL 5-0.45 % IV SOLN
INTRAVENOUS | Status: DC
  Administered 2018-09-17 – 2018-09-19 (×3): via INTRAVENOUS

## 2018-09-17 MED ORDER — EPHEDRINE SULFATE-NACL 50-0.9 MG/10ML-% IV SOSY
PREFILLED_SYRINGE | INTRAVENOUS | Status: DC | PRN
  Administered 2018-09-17: 10 mg via INTRAVENOUS

## 2018-09-17 MED ORDER — LORATADINE 10 MG PO TABS
10.0000 mg | ORAL_TABLET | Freq: Every day | ORAL | Status: DC
  Administered 2018-09-18 – 2018-09-20 (×3): 10 mg via ORAL
  Filled 2018-09-17 (×3): qty 1

## 2018-09-17 MED ORDER — SUGAMMADEX SODIUM 200 MG/2ML IV SOLN
INTRAVENOUS | Status: DC | PRN
  Administered 2018-09-17: 200 mg via INTRAVENOUS

## 2018-09-17 MED ORDER — ONDANSETRON HCL 4 MG/2ML IJ SOLN
INTRAMUSCULAR | Status: AC
  Filled 2018-09-17: qty 2

## 2018-09-17 MED ORDER — LACTATED RINGERS IR SOLN
Status: DC | PRN
  Administered 2018-09-17: 1000 mL

## 2018-09-17 MED ORDER — CARBINOXAMINE MALEATE 6 MG PO TABS: 6.0000 mg | ORAL_TABLET | Freq: Two times a day (BID) | ORAL | Status: DC

## 2018-09-17 MED ORDER — FENTANYL CITRATE (PF) 250 MCG/5ML IJ SOLN
INTRAMUSCULAR | Status: AC
  Filled 2018-09-17: qty 5

## 2018-09-17 MED ORDER — MAGNESIUM CITRATE PO SOLN
1.0000 | Freq: Once | ORAL | Status: AC
  Administered 2018-09-17: 1 via ORAL

## 2018-09-17 MED ORDER — ROCURONIUM BROMIDE 100 MG/10ML IV SOLN
INTRAVENOUS | Status: AC
  Filled 2018-09-17: qty 1

## 2018-09-17 MED ORDER — GABAPENTIN 300 MG PO CAPS
300.0000 mg | ORAL_CAPSULE | Freq: Once | ORAL | Status: AC
  Administered 2018-09-17: 300 mg via ORAL
  Filled 2018-09-17: qty 1

## 2018-09-17 MED ORDER — ONDANSETRON HCL 4 MG/2ML IJ SOLN
INTRAMUSCULAR | Status: DC | PRN
  Administered 2018-09-17: 4 mg via INTRAVENOUS

## 2018-09-17 MED ORDER — HYDROMORPHONE HCL 1 MG/ML IJ SOLN
0.2500 mg | INTRAMUSCULAR | Status: DC | PRN
  Administered 2018-09-17 (×4): 0.5 mg via INTRAVENOUS

## 2018-09-17 MED ORDER — SODIUM CHLORIDE (PF) 0.9 % IJ SOLN
INTRAMUSCULAR | Status: DC | PRN
  Administered 2018-09-17: 20 mL

## 2018-09-17 MED ORDER — HYDROMORPHONE HCL 1 MG/ML IJ SOLN
0.5000 mg | INTRAMUSCULAR | Status: DC | PRN
  Administered 2018-09-17 – 2018-09-18 (×4): 0.5 mg via INTRAVENOUS
  Administered 2018-09-18: 1 mg via INTRAVENOUS
  Filled 2018-09-17 (×5): qty 1

## 2018-09-17 MED ORDER — HYDROMORPHONE HCL 1 MG/ML IJ SOLN
INTRAMUSCULAR | Status: AC
  Filled 2018-09-17: qty 1

## 2018-09-17 MED ORDER — HYDROCODONE-ACETAMINOPHEN 7.5-325 MG PO TABS: 1.0000 | ORAL_TABLET | Freq: Once | ORAL | Status: DC | PRN

## 2018-09-17 MED ORDER — HYDROCODONE-ACETAMINOPHEN 5-325 MG PO TABS
ORAL_TABLET | ORAL | Status: AC
  Administered 2018-09-17: 1 via ORAL
  Filled 2018-09-17: qty 1

## 2018-09-17 MED ORDER — ONDANSETRON HCL 4 MG/2ML IJ SOLN
4.0000 mg | INTRAMUSCULAR | Status: DC | PRN
  Administered 2018-09-17 – 2018-09-18 (×3): 4 mg via INTRAVENOUS
  Filled 2018-09-17 (×2): qty 2

## 2018-09-17 MED ORDER — FAMOTIDINE 20 MG PO TABS
20.0000 mg | ORAL_TABLET | Freq: Every day | ORAL | Status: DC
  Administered 2018-09-18 – 2018-09-20 (×3): 20 mg via ORAL
  Filled 2018-09-17 (×3): qty 1

## 2018-09-17 MED ORDER — PHENYLEPHRINE HCL 10 MG/ML IJ SOLN
INTRAMUSCULAR | Status: AC
  Filled 2018-09-17: qty 1

## 2018-09-17 MED ORDER — KETAMINE HCL 10 MG/ML IJ SOLN
INTRAMUSCULAR | Status: AC
  Filled 2018-09-17: qty 1

## 2018-09-17 MED ORDER — HYDROCODONE-ACETAMINOPHEN 5-325 MG PO TABS: 1.0000 | ORAL_TABLET | Freq: Four times a day (QID) | ORAL | 0 refills | Status: DC | PRN

## 2018-09-17 MED ORDER — INFLUENZA VAC SPLIT QUAD 0.5 ML IM SUSY
0.5000 mL | PREFILLED_SYRINGE | INTRAMUSCULAR | Status: AC
  Administered 2018-09-18: 0.5 mL via INTRAMUSCULAR
  Filled 2018-09-17: qty 0.5

## 2018-09-17 MED ORDER — LIDOCAINE 2% (20 MG/ML) 5 ML SYRINGE
INTRAMUSCULAR | Status: AC
  Filled 2018-09-17: qty 5

## 2018-09-17 MED ORDER — SERTRALINE HCL 50 MG PO TABS
50.0000 mg | ORAL_TABLET | Freq: Every day | ORAL | Status: DC
  Administered 2018-09-18: 50 mg via ORAL
  Filled 2018-09-17 (×3): qty 1

## 2018-09-17 MED ORDER — SODIUM CHLORIDE (PF) 0.9 % IJ SOLN
INTRAMUSCULAR | Status: AC
  Filled 2018-09-17: qty 20

## 2018-09-17 MED ORDER — KETOROLAC TROMETHAMINE 30 MG/ML IJ SOLN
INTRAMUSCULAR | Status: AC
  Filled 2018-09-17: qty 1

## 2018-09-17 MED ORDER — DEXAMETHASONE SODIUM PHOSPHATE 10 MG/ML IJ SOLN
INTRAMUSCULAR | Status: DC | PRN
  Administered 2018-09-17: 5 mg via INTRAVENOUS

## 2018-09-17 SURGICAL SUPPLY — 63 items
ADH SKN CLS APL DERMABOND .7 (GAUZE/BANDAGES/DRESSINGS) ×1
AGENT HMST KT MTR STRL THRMB (HEMOSTASIS) ×1
APL ESCP 34 STRL LF DISP (HEMOSTASIS) ×1
APPLICATOR SURGIFLO ENDO (HEMOSTASIS) ×2 IMPLANT
BAG SPEC RTRVL LRG 6X4 10 (ENDOMECHANICALS) ×1
CHLORAPREP W/TINT 26ML (MISCELLANEOUS) ×2 IMPLANT
CLIP SUT LAPRA TY ABSORB (SUTURE) ×4 IMPLANT
CLIP VESOLOCK LG 6/CT PURPLE (CLIP) ×2 IMPLANT
CLIP VESOLOCK MED LG 6/CT (CLIP) ×4 IMPLANT
COVER SURGICAL LIGHT HANDLE (MISCELLANEOUS) ×2 IMPLANT
COVER TIP SHEARS 8 DVNC (MISCELLANEOUS) ×1 IMPLANT
COVER TIP SHEARS 8MM DA VINCI (MISCELLANEOUS) ×1
COVER WAND RF STERILE (DRAPES) ×1 IMPLANT
DECANTER SPIKE VIAL GLASS SM (MISCELLANEOUS) ×2 IMPLANT
DERMABOND ADVANCED (GAUZE/BANDAGES/DRESSINGS) ×1
DERMABOND ADVANCED .7 DNX12 (GAUZE/BANDAGES/DRESSINGS) ×1 IMPLANT
DRAIN CHANNEL 15F RND FF 3/16 (WOUND CARE) ×2 IMPLANT
DRAPE ARM DVNC X/XI (DISPOSABLE) ×4 IMPLANT
DRAPE COLUMN DVNC XI (DISPOSABLE) ×1 IMPLANT
DRAPE DA VINCI XI ARM (DISPOSABLE) ×4
DRAPE DA VINCI XI COLUMN (DISPOSABLE) ×1
DRAPE INCISE IOBAN 66X45 STRL (DRAPES) ×2 IMPLANT
DRAPE SHEET LG 3/4 BI-LAMINATE (DRAPES) ×2 IMPLANT
ELECT PENCIL ROCKER SW 15FT (MISCELLANEOUS) ×2 IMPLANT
ELECT REM PT RETURN 15FT ADLT (MISCELLANEOUS) ×2 IMPLANT
EVACUATOR SILICONE 100CC (DRAIN) ×2 IMPLANT
GLOVE BIO SURGEON STRL SZ 6.5 (GLOVE) ×2 IMPLANT
GLOVE BIOGEL M 8.0 STRL (GLOVE) ×4 IMPLANT
GOWN STRL REUS W/TWL LRG LVL3 (GOWN DISPOSABLE) ×4 IMPLANT
HEMOSTAT SURGICEL 4X8 (HEMOSTASIS) ×2 IMPLANT
IRRIG SUCT STRYKERFLOW 2 WTIP (MISCELLANEOUS) ×2
IRRIGATION SUCT STRKRFLW 2 WTP (MISCELLANEOUS) IMPLANT
KIT BASIN OR (CUSTOM PROCEDURE TRAY) ×2 IMPLANT
LOOP VESSEL MAXI BLUE (MISCELLANEOUS) ×1 IMPLANT
MARKER SKIN DUAL TIP RULER LAB (MISCELLANEOUS) ×2 IMPLANT
NDL INSUFFLATION 14GA 120MM (NEEDLE) ×1 IMPLANT
NEEDLE INSUFFLATION 14GA 120MM (NEEDLE) ×2 IMPLANT
POUCH SPECIMEN RETRIEVAL 10MM (ENDOMECHANICALS) ×2 IMPLANT
PROTECTOR NERVE ULNAR (MISCELLANEOUS) ×4 IMPLANT
RELOAD STAPLE 60 2.6 WHT THN (STAPLE) IMPLANT
RELOAD STAPLER WHITE 60MM (STAPLE) IMPLANT
SEAL CANN UNIV 5-8 DVNC XI (MISCELLANEOUS) ×4 IMPLANT
SEAL XI 5MM-8MM UNIVERSAL (MISCELLANEOUS) ×4
SOLUTION ELECTROLUBE (MISCELLANEOUS) ×2 IMPLANT
SPONGE LAP 4X18 RFD (DISPOSABLE) ×2 IMPLANT
STAPLE ECHEON FLEX 60 POW ENDO (STAPLE) IMPLANT
STAPLER RELOAD WHITE 60MM (STAPLE)
SURGIFLO W/THROMBIN 8M KIT (HEMOSTASIS) ×2 IMPLANT
SUT ETHILON 3 0 PS 1 (SUTURE) ×2 IMPLANT
SUT MNCRL AB 4-0 PS2 18 (SUTURE) ×4 IMPLANT
SUT VIC AB 0 CT1 27 (SUTURE) ×8
SUT VIC AB 0 CT1 27XBRD ANTBC (SUTURE) ×4 IMPLANT
SUT VIC AB 2-0 SH 27 (SUTURE) ×10
SUT VIC AB 2-0 SH 27X BRD (SUTURE) ×5 IMPLANT
SUT VLOC BARB 180 ABS3/0GR12 (SUTURE) ×4
SUTURE VLOC BRB 180 ABS3/0GR12 (SUTURE) ×2 IMPLANT
TOWEL OR 17X26 10 PK STRL BLUE (TOWEL DISPOSABLE) IMPLANT
TOWEL OR NON WOVEN STRL DISP B (DISPOSABLE) ×2 IMPLANT
TRAY FOLEY MTR SLVR 16FR STAT (SET/KITS/TRAYS/PACK) ×2 IMPLANT
TRAY LAPAROSCOPIC (CUSTOM PROCEDURE TRAY) ×2 IMPLANT
TROCAR BLADELESS OPT 5 100 (ENDOMECHANICALS) IMPLANT
TROCAR UNIVERSAL OPT 12M 100M (ENDOMECHANICALS) ×2 IMPLANT
TROCAR XCEL 12X100 BLDLESS (ENDOMECHANICALS) ×2 IMPLANT

## 2018-09-17 NOTE — Op Note (Addendum)
Preoperative diagnosis: Left renal mass  Postop diagnosis: Same  Procedure: 1.  Left robot assisted laparoscopic radical partial nephrectomy  Attending: Nicolette Bang, MD  Resident: Case Clydene Laming, MD  Assistant: Debbrah Alar  Anesthesia: General  Estimated blood loss: 200 cc  Drains: 16 French Foley catheter, JP drain  Specimens: Left renal mass with overlying renal fat  Antibiotics: ancef  Findings: 1 artery and 1 vein. 2cm left anterior upper pole renal mass Warm Ischemia time: 0 minutes The assistant was utilized for retraction, suction, passing sutures and instruments  Indications: Patient is a 48 year old with a history of 2 cm left renal mass.  The mass was amenable to partial nephrectomy.  After discussing treatment options patient decided to proceed with left robot assisted laparoscopic partial nephrectomy.  Procedure in detail: Prior to procedure consent was obtained. Patient was brought to the operating room and briefing was done sure correct patient, correct procedure, correct site.  General anesthesia was in administered patient was placed in the right lateral decubitus position.  a 16 French catheter was placed. their abdomen and flank was then prepped and draped usual sterile fashion.  A Veress needle was used to obtain pneumoperitoneum.  Once pneumoperitoneum was reestablished to 15 mmHg we then placed a 8 mm camera port lateral to the umbilicus at the latera; edge of rectus.  We then proceeded to place 3 more robotic ports. We then placed an assistant port. We then docked the robot.   We then dissected along the white line of Toldt.  We then reflected the colon medially.  We then identified the psoas muscle.  We then turned our attention to locating the renal mass. We proceeded to remove the overlying renal fat until we identified the renal mass. Once this was done with the circumscribed the mass with electrocautery. We then placed a bulldog clamp on the renal artery and  this began our warm ischemia time. Using sharp dissection the mass was removed. Using a 2-0 V lock suture we oversewed the tumor bed. We then used 0 Vicryl with hem-o-locks in an interrupted fashion to reapproximate the defect int he kidney. Once this was done we removed the bulldog clamp and noted no residual bleeding.  We then placed the specimen in an Endo Catch bag.  Once the specimen was in the Endo Catch bag we then inspected the left kidney and noted no residual bleeding. We then placed a JP drain in the lower quadrant robot port. THis was secured with a 0 nylon.  We then removed our instruments, undocked the robot, and released the pneumoperitoneum. Once the specimen was removed we then closed the camera and assistant ports with 0 Vicryl in interrupted fashion.  The skin was then subcuticularly closed with 4-0 Monocryl.  We then placed Dermabond over all the incisions.  This included the procedure which resulted by the patient.  Complications: None  Condition: Stable, extubated, transferred to PACU.  Plan: Patient is to be admitted for inpatient stay. The foley catheter will be removed in the morning. They will be started on a clear liquid diet POD#1

## 2018-09-17 NOTE — Anesthesia Procedure Notes (Addendum)
Procedure Name: Intubation Date/Time: 09/17/2018 7:51 AM Performed by: Eben Burow, CRNA Pre-anesthesia Checklist: Patient identified, Emergency Drugs available, Suction available, Patient being monitored and Timeout performed Patient Re-evaluated:Patient Re-evaluated prior to induction Oxygen Delivery Method: Circle system utilized Preoxygenation: Pre-oxygenation with 100% oxygen Induction Type: IV induction Ventilation: Mask ventilation without difficulty Laryngoscope Size: Mac and 3 Grade View: Grade I Tube type: Oral Tube size: 7.0 mm Number of attempts: 1 Airway Equipment and Method: Stylet Placement Confirmation: ETT inserted through vocal cords under direct vision,  positive ETCO2 and breath sounds checked- equal and bilateral Secured at: 21 cm Tube secured with: Tape Dental Injury: Teeth and Oropharynx as per pre-operative assessment

## 2018-09-17 NOTE — Discharge Instructions (Signed)
1.  Activity:  You are encouraged to ambulate frequently (about every hour during waking hours) to help prevent blood clots from forming in your legs or lungs.  However, you should not engage in any heavy lifting (> 10-15 lbs), strenuous activity, or straining. °2. Diet: You should advance your diet as instructed by your physician.  It will be normal to have some bloating, nausea, and abdominal discomfort intermittently. °3. Prescriptions:  You will be provided a prescription for pain medication to take as needed.  If your pain is not severe enough to require the prescription pain medication, you may take extra strength Tylenol instead which will have less side effects.  You should also take a prescribed stool softener to avoid straining with bowel movements as the prescription pain medication may constipate you. °4. Incisions: You may remove your dressing bandages 48 hours after surgery if not removed in the hospital.  You will either have some small staples or special tissue glue at each of the incision sites. Once the bandages are removed (if present), the incisions may stay open to air.  You may start showering (but not soaking or bathing in water) the 2nd Crume after surgery and the incisions simply need to be patted dry after the shower.  No additional care is needed. °5. What to call us about: You should call the office ([phone]) if you develop fever > 101 or develop persistent vomiting. ° °You may resume aspirin, advil, aleve, vitamins, and supplements 7 days after surgery. °

## 2018-09-17 NOTE — H&P (Signed)
Urology Admission H&P  Chief Complaint: left renal mass  History of Present Illness: Ms Cisar is a 48yo with a hx of left renal mass. She has no flank pain. No hematuria. Mass found on workup for abdominal pain. No family hx of renal masses.  Past Medical History:  Diagnosis Date  . Anxiety    currently managed on CBD oil, was on medication briefly but made her OCD worse  . Arthritis    hands, DDD, and back 12-2017  . Breast mass, right 09/2012   removed because it was painful, was found to be calcified scar tissue  . Cancer ([place])    left renal mass  . Cervical dysplasia age 39  . Complication of anesthesia    Cries coming out of anesthesia   . Family history of adverse reaction to anesthesia    Maternal grandmother PONV  . Geographical tongue   . Gestational diabetes    gestational  . 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 History of kidney stones 10/2011  . 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 Hx of migraines   . OCD (obsessive compulsive disorder)    cleanliness with home and workspace  . Rosacea   . Sinus congestion 10/12/2012  . Sleep apnea 04/2017   patient is wearing C-pap  . Tobacco abuse 1989   max was 1.5 packs per [place]  . Vitamin D deficiency 10/2013   Value 29  . Vitiligo    Past Surgical History:  Procedure Laterality Date  . ABDOMINOPLASTY  2008  . BREAST BIOPSY Right 10/15/2012   Procedure: BREAST BIOPSY;  Surgeon: Haywood Lasso, MD;  Location: [facility];  Service: General;  Laterality: Right;  removal right breast mass  . BREAST EXCISIONAL BIOPSY Right   . BREAST SURGERY     Lift  . CESAREAN SECTION  2004, 2007  . CESAREAN SECTION N/A 02/07/2015   Procedure: CESAREAN SECTION;  Surgeon: Jerelyn Charles, MD;  Location: [facility];  Service: Obstetrics;  Laterality: N/A;  EDD: 02/13/15   . dialation and cutterage    . GYNECOLOGIC CRYOSURGERY  age 63  . INTRAUTERINE DEVICE  INSERTION  03/2015   Mirena at [place]  . MICRODISCECTOMY LUMBAR     12-2017   . REDUCTION MAMMAPLASTY Bilateral   . TONSILLECTOMY  2000    Home Medications:  Current Facility-Administered Medications  Medication Dose Route Frequency Provider Last Rate Last Dose  . bupivacaine liposome (EXPAREL) 1.3 % injection 266 mg  20 mL Infiltration Once Cleon Gustin, MD      . ceFAZolin (ANCEF) IVPB 2g/100 mL premix  2 g Intravenous 30 min Pre-Op Alyson Ingles Candee Furbish, MD      . lactated ringers infusion   Intravenous Continuous Barnet Glasgow, MD 10 mL/hr at 09/17/18 9242     Allergies:  Allergies  Allergen Reactions  . Ampicillin Hives  . Dust Mite Extract Cough  . Erythromycin Nausea And Vomiting  . Mold Extract [Trichophyton] Cough  . Other Hives    SAUERKRAUT  . Wine [Alcohol] Hives  . Ciprofloxacin Hives and Rash    Family History  Problem Relation Age of Onset  . Arthritis Mother   . High Cholesterol Mother   . High blood pressure Mother   . Hearing loss Father   . High Cholesterol Father   . High blood pressure Father   . Cancer Maternal Grandfather  lymphoma  . Cancer Paternal Grandfather        liver  . Alcohol abuse Paternal Grandfather   . Lung cancer Maternal Grandmother        and maternal uncle; small cell   . Arthritis Maternal Grandmother   . Hyperlipidemia Maternal Grandmother   . Hypertension Maternal Grandmother   . Hearing loss Maternal Grandmother   . Cancer Maternal Uncle        Lung cancer  . Hyperlipidemia Other   . Asthma Brother   . Alcohol abuse Paternal Grandmother   . Hypertension Paternal Grandmother   . Eczema Son   . Colon cancer Neg Hx   . Breast cancer Neg Hx   . Allergic rhinitis Neg Hx   . Angioedema Neg Hx   . Urticaria Neg Hx    Social History:  reports that she quit smoking about 3 weeks ago. Her smoking use included cigarettes. She smoked 1.00 pack per [place]. She has never used smokeless tobacco. She  reports previous alcohol use. She reports that she does not use drugs.  Review of Systems  All other systems reviewed and are negative.   Physical Exam:  Vital signs in last 24 hours: Temp:  [99 F (37.2 C)] 99 F (37.2 C) (01/23 0548) Pulse Rate:  [64] 64 (01/23 0548) Resp:  [18] 18 (01/23 0548) BP: (120)/(67) 120/67 (01/23 0548) SpO2:  [98 %] 98 % (01/23 0548) Weight:  [86.2 kg] 86.2 kg (01/23 0549) Physical Exam  Constitutional: She is oriented to person, place, and time. She appears well-developed and well-nourished.  HENT:  Head: Normocephalic and atraumatic.  Eyes: Pupils are equal, round, and reactive to light. EOM are normal.  Neck: Normal range of motion. No thyromegaly present.  Cardiovascular: Normal rate and regular rhythm.  Respiratory: Effort normal. No respiratory distress.  GI: Soft. She exhibits no distension.  Musculoskeletal: Normal range of motion.        General: No edema.  Neurological: She is alert and oriented to person, place, and time.  Skin: Skin is warm and dry.  Psychiatric: She has a normal mood and affect. Her behavior is normal. Judgment and thought content normal.    Laboratory Data:  No results found for this or any previous visit (from the past 24 hour(s)). No results found for this or any previous visit (from the past 240 hour(s)). Creatinine: Recent Labs    09/10/18 1504  CREATININE 0.73   Baseline Creatinine: 0.7  Impression/Assessment:  47yo with a left renal mass  Plan:  The risks/benefits/alternatives to left robotic partial nephrectomy was explained to the patient and he understands and wishes to proceed with surgery.  Nicolette Bang 09/17/2018, 7:32 AM

## 2018-09-17 NOTE — Plan of Care (Signed)
Maintaining level of comfort

## 2018-09-17 NOTE — Transfer of Care (Signed)
Immediate Anesthesia Transfer of Care Note  Patient: Mackenzie Meyers  Procedure(s) Performed: XI ROBOTIC ASSITED PARTIAL NEPHRECTOMY (Left )  Patient Location: PACU  Anesthesia Type:General  Level of Consciousness: awake, alert  and oriented  Airway & Oxygen Therapy: Patient Spontanous Breathing and Patient connected to face mask oxygen  Post-op Assessment: Report given to RN and Post -op Vital signs reviewed and stable  Post vital signs: Reviewed and stable  Last Vitals:  Vitals Value Taken Time  BP 134/62 09/17/2018 10:18 AM  Temp    Pulse 84 09/17/2018 10:20 AM  Resp 26 09/17/2018 10:20 AM  SpO2 100 % 09/17/2018 10:20 AM  Vitals shown include unvalidated device data.  Last Pain:  Vitals:   09/17/18 0606  TempSrc:   PainSc: 0-No pain         Complications: No apparent anesthesia complications

## 2018-09-17 NOTE — Anesthesia Postprocedure Evaluation (Signed)
Anesthesia Post Note  Patient: Mackenzie Meyers  Procedure(s) Performed: XI ROBOTIC ASSITED PARTIAL NEPHRECTOMY (Left )     Patient location during evaluation: PACU Anesthesia Type: General Level of consciousness: awake and alert Pain management: pain level controlled Vital Signs Assessment: post-procedure vital signs reviewed and stable Respiratory status: spontaneous breathing, nonlabored ventilation, respiratory function stable and patient connected to nasal cannula oxygen Cardiovascular status: blood pressure returned to baseline and stable Postop Assessment: no apparent nausea or vomiting Anesthetic complications: no    Last Vitals:  Vitals:   09/17/18 1300 09/17/18 1400  BP: 114/65 101/64  Pulse: 75 76  Resp:  12  Temp: 36.4 C   SpO2: 94% 97%    Last Pain:  Vitals:   09/17/18 1340  TempSrc:   PainSc: Asleep                 Barnet Glasgow

## 2018-09-18 ENCOUNTER — Encounter (HOSPITAL_COMMUNITY): Payer: Self-pay | Admitting: Urology

## 2018-09-18 DIAGNOSIS — C642 Malignant neoplasm of left kidney, except renal pelvis: Secondary | ICD-10-CM | POA: Diagnosis not present

## 2018-09-18 LAB — BASIC METABOLIC PANEL
Anion gap: 7 (ref 5–15)
BUN: 8 mg/dL (ref 6–20)
CO2: 23 mmol/L (ref 22–32)
Calcium: 8.2 mg/dL — ABNORMAL LOW (ref 8.9–10.3)
Chloride: 108 mmol/L (ref 98–111)
Creatinine, Ser: 0.66 mg/dL (ref 0.44–1.00)
GFR calc Af Amer: >60 mL/min (ref >60)
GFR calc non Af Amer: >60 mL/min (ref >60)
Glucose, Bld: 144 mg/dL — ABNORMAL HIGH (ref 70–99)
Potassium: 4 mmol/L (ref 3.5–5.1)
Sodium: 138 mmol/L (ref 135–145)

## 2018-09-18 LAB — HEMOGLOBIN AND HEMATOCRIT, BLOOD
HCT: 37.9 % (ref 36.0–46.0)
Hemoglobin: 11.9 g/dL — ABNORMAL LOW (ref 12.0–15.0)

## 2018-09-18 MED ORDER — ADULT MULTIVITAMIN W/MINERALS CH
1.0000 | ORAL_TABLET | Freq: Every day | ORAL | Status: DC
  Administered 2018-09-18 – 2018-09-20 (×3): 1 via ORAL
  Filled 2018-09-18 (×3): qty 1

## 2018-09-18 MED ORDER — ENSURE ENLIVE PO LIQD
237.0000 mL | ORAL | Status: DC
  Administered 2018-09-18 – 2018-09-19 (×2): 237 mL via ORAL

## 2018-09-18 NOTE — Progress Notes (Signed)
Initial Nutrition Assessment  DOCUMENTATION CODES:   Obesity unspecified  INTERVENTION:  - Will order Ensure Enlive once/Dicicco, this supplement provides 350 kcal and 20 grams of protein. - Will order daily multivitamin with minerals.    NUTRITION DIAGNOSIS:   Inadequate oral intake related to acute illness as evidenced by per patient/family report.  GOAL:   Patient will meet greater than or equal to 90% of their needs  MONITOR:   PO intake, Supplement acceptance, Weight trends, Labs, Skin  REASON FOR ASSESSMENT:   Malnutrition Screening Tool  ASSESSMENT:   48 year old with a hx of left renal mass. She has no flank pain and no hematuria. Mass found on workup for abdominal pain. No family hx of renal masses.  Patient is POD #1 L robotic partial nephrectomy. Diet advanced to Regular and she was able to eat 50% of breakfast this AM (170 kcal, 8 grams of protein). Patient reports abdominal pain PTA which led to decreased PO intakes. Intakes did not usually increase pain, but pain caused her to be disinterested in eating. Some abdominal pain today which is well controlled with medication.  Per chart review, pre-op weight was 190 lb and weight has been stable over the past 3 months. On 05/19/18 she weighed 199 lb indicating 9 lb weight loss (4.5% body weight) in the past 4 months; not significant for time frame.   Medications reviewed; 20 mg oral pepcid/Zangara. Labs reviewed; Ca: 8.2 mg/dl.  IVF; D5-1/2 NS @ 100 ml/hr ( 408 kcal).     NUTRITION - FOCUSED PHYSICAL EXAM:  Completed; no muscle and no fat wasting.   Diet Order:   Diet Order            Diet regular Room service appropriate? Yes; Fluid consistency: Thin  Diet effective now              EDUCATION NEEDS:   No education needs have been identified at this time  Skin:  Skin Assessment: Skin Integrity Issues: Skin Integrity Issues:: Incisions Incisions: abdominal (1/23)  Last BM:  1/22  Height:   Ht  Readings from Last 1 Encounters:  09/17/18 5\' 4"  (1.626 m)    Weight:   Wt Readings from Last 1 Encounters:  09/17/18 86.2 kg    Ideal Body Weight:  54.54 kg  BMI:  Body mass index is 32.61 kg/m.  Estimated Nutritional Needs:   Kcal:  3557-3220 kcal  Protein:  90-105 grams  Fluid:  >/= 2 L/Lepp      Jarome Matin, MS, RD, LDN, [facility] Inpatient Clinical Dietitian Pager # [phone] After hours/weekend pager # [phone]

## 2018-09-18 NOTE — Progress Notes (Signed)
Urology Progress Note   1 Lapre Post-Op  Subjective/Interval: NAEON. AFVSS. UOP adequate (clear). Drain output low. Pain well controlled.  Objective: Vital signs in last 24 hours: Temp:  [97.5 F (36.4 C)-98.7 F (37.1 C)] 98 F (36.7 C) (01/24 0456) Pulse Rate:  [65-84] 65 (01/24 0456) Resp:  [10-20] 18 (01/24 0456) BP: (100-134)/(56-69) 100/65 (01/24 0456) SpO2:  [93 %-100 %] 93 % (01/24 0456)  Intake/Output from previous [place]: 01/23 0701 - 01/24 0700 In: 2678.8 [P.O.:360; I.V.:2218.8; IV Piggyback:100] Out: 2500 [Urine:2350; Drains:80; Blood:50] Intake/Output this shift: No intake/output data recorded.  Physical Exam:  General: Alert and oriented CV: RRR Lungs: Clear Abdomen: Soft, appropriately tender, non-distended, incisions c/d/i GU: Foley in place draining clear yellow urine Ext: NT, No erythema  Lab Results: Recent Labs    09/17/18 1054 09/18/18 0546  HGB 13.6 11.9*  HCT 43.5 37.9   BMET Recent Labs    09/18/18 0546  NA 138  K 4.0  CL 108  CO2 23  GLUCOSE 144*  BUN 8  CREATININE 0.66  CALCIUM 8.2*     Studies/Results: No results found.  Assessment/Plan:  48 y.o. female s/p left robotic partial nephrectomy.  Overall doing well post-op.   - Continue pain control with PO/IV meds PRN - Med[place], regular diet - Discontinue Foley catheter - OOB, IS, SCDs   LOS: 0 days   Evett Kassa Rob Bunting 09/18/2018, 7:20 AM

## 2018-09-19 ENCOUNTER — Observation Stay (HOSPITAL_COMMUNITY): Payer: PRIVATE HEALTH INSURANCE

## 2018-09-19 DIAGNOSIS — C642 Malignant neoplasm of left kidney, except renal pelvis: Secondary | ICD-10-CM | POA: Diagnosis not present

## 2018-09-19 LAB — HEMOGLOBIN AND HEMATOCRIT, BLOOD
HCT: 38 % (ref 36.0–46.0)
Hemoglobin: 11.9 g/dL — ABNORMAL LOW (ref 12.0–15.0)

## 2018-09-19 MED ORDER — HYDROCODONE-ACETAMINOPHEN 7.5-325 MG/15ML PO SOLN
10.0000 mL | Freq: Once | ORAL | Status: AC | PRN
  Administered 2018-09-19: 10 mL via ORAL

## 2018-09-19 MED ORDER — HEPARIN SODIUM (PORCINE) 5000 UNIT/ML IJ SOLN
5000.0000 [IU] | Freq: Three times a day (TID) | INTRAMUSCULAR | Status: DC
  Administered 2018-09-19 – 2018-09-20 (×2): 5000 [IU] via SUBCUTANEOUS
  Filled 2018-09-19 (×2): qty 1

## 2018-09-19 MED ORDER — DOCUSATE SODIUM 100 MG PO CAPS
100.0000 mg | ORAL_CAPSULE | Freq: Every day | ORAL | Status: DC
  Administered 2018-09-19: 100 mg via ORAL
  Filled 2018-09-19 (×2): qty 1

## 2018-09-19 NOTE — Progress Notes (Signed)
Pt c/o of nausea and T 101.4, went up to 102, tylenol administered, new IV started, Zofran IV administered, IV fluids re-started. Pt's temp re-checked going down to 101.92F. Will continue to monitor.

## 2018-09-19 NOTE — Progress Notes (Signed)
Weaned patient to room air. O2 saturations 92-94% on RA at rest. When patient ambulates her O2 satuations drop to 79%. She c/o pleuritic CP on deep inspiration and has a cough with small amount of brown sputum. Urologist, Dr. Clydene Laming notified. Will continue to monitor and provide supplemental O2 as needed.  Mackenzie Meyers. Brigitte Pulse, RN

## 2018-09-19 NOTE — Progress Notes (Signed)
Urology Progress Note   2 Days Post-Op  Subjective/Interval: Fever to 102 overnight that has since resolved. Patient also with n/v that has resolved. Voiding volitionally with Foley removed. Drain output low. Pain well controlled with medication.   Objective: Vital signs in last 24 hours: Temp:  [98 F (36.7 C)-102 F (38.9 C)] 99.5 F (37.5 C) (01/25 0536) Pulse Rate:  [72-97] 91 (01/25 0536) Resp:  [16-20] 20 (01/25 0536) BP: (108-130)/(68-81) 108/74 (01/25 0536) SpO2:  [79 %-100 %] 100 % (01/25 0600)  Intake/Output from previous [place]: 01/24 0701 - 01/25 0700 In: 360 [P.O.:360] Out: 575 [Urine:250; Drains:50; Stool:275] Intake/Output this shift: No intake/output data recorded.  Physical Exam:  General: Alert and oriented CV: RRR Lungs: Normal respiratory effort on room air Abdomen: Soft, appropriately tender, non-distended, incisions c/d/i Ext: NT, No erythema  Lab Results: Recent Labs    09/17/18 1054 09/18/18 0546 09/19/18 0550  HGB 13.6 11.9* 11.9*  HCT 43.5 37.9 38.0   BMET Recent Labs    09/18/18 0546  NA 138  K 4.0  CL 108  CO2 23  GLUCOSE 144*  BUN 8  CREATININE 0.66  CALCIUM 8.2*     Studies/Results: No results found.  Assessment/Plan:  48 y.o. female s/p left robotic partial nephrectomy on 1/23.  - Continue pain control with PO/IV meds PRN - OOB, IS, SCDs - If patient spikes another fever, will perform work up.   LOS: 0 days   Mackenzie Meyers Rob Bunting 09/19/2018, 8:12 AM

## 2018-09-20 DIAGNOSIS — C642 Malignant neoplasm of left kidney, except renal pelvis: Secondary | ICD-10-CM | POA: Diagnosis not present

## 2018-09-20 LAB — CBC
HCT: 35.9 % — ABNORMAL LOW (ref 36.0–46.0)
Hemoglobin: 11.2 g/dL — ABNORMAL LOW (ref 12.0–15.0)
MCH: 29 pg (ref 26.0–34.0)
MCHC: 31.2 g/dL (ref 30.0–36.0)
MCV: 93 fL (ref 80.0–100.0)
Platelets: 179 10*3/uL (ref 150–400)
RBC: 3.86 MIL/uL — ABNORMAL LOW (ref 3.87–5.11)
RDW: 13.6 % (ref 11.5–15.5)
WBC: 6.6 10*3/uL (ref 4.0–10.5)
nRBC: 0 % (ref 0.0–0.2)

## 2018-09-20 NOTE — Discharge Summary (Signed)
[facility] Discharge Summary  Admit date: 09/17/2018  Discharge date and time: 09/20/18   Discharge to: Home  Discharge Service: Urology  Discharge Attending Physician:  Festus Aloe, MD  Discharge  Diagnoses: <principal problem not specified>  Secondary Diagnosis: Active Problems:   Renal mass   OR Procedures: Procedure(s): XI ROBOTIC ASSITED PARTIAL NEPHRECTOMY 09/17/2018   Ancillary Procedures: None   Discharge Dass Services: The patient was seen and examined by the Urology team both in the morning and immediately prior to discharge.  Vital signs and laboratory values were stable and within normal limits.  The physical exam was benign and unchanged and all surgical wounds were examined.  Discharge instructions were explained and all questions answered.  Subjective  No acute events overnight. Pain Controlled. No fever or chills.  Objective Patient Vitals for the past 8 hrs:  BP Temp Temp src Pulse SpO2  09/20/18 1328 (!) 131/52 99.1 F (37.3 C) Oral 78 93 %   Total I/O In: 480 [P.O.:480] Out: -   General Appearance:        No acute distress Lungs:                       Normal work of breathing on room air Heart:                                Regular rate and rhythm Abdomen:                         Soft, non-tender, non-distended, incisions c/d/i Extremities:                      Warm and well perfused   Hospital Course:  The patient underwent left partial nephrectomy on 09/17/2018.  The patient tolerated the procedure well, was extubated in the OR, and afterwards was taken to the PACU for routine post-surgical care. When stable the patient was transferred to the floor.  The patient's diet was slowly advanced and at the time of discharge was tolerating a regular diet. She did have an oxygen requirement on POD#2. CXR consistent with atelectasis. Her oxygenation improved with aggressive IS and ambulation. She was eventually weaned off the oxygen and was able to  maintain satisfactory oxygen levels with ambulation. The patient was discharged home 3 Days Post-Op, at which point was tolerating a regular solid diet, was able to void spontaneously, have adequate pain control with P.O. pain medication, and could ambulate without difficulty. The patient will follow up with [place] for post op check.   Condition at Discharge: Improved  Discharge Medications:  Allergies as of 09/20/2018      Reactions   Ampicillin Hives   Dust Mite Extract Cough   Erythromycin Nausea And Vomiting   Mold Extract [trichophyton] Cough   Other Hives   SAUERKRAUT   Wine [alcohol] Hives   Ciprofloxacin Hives, Rash      Medication List    STOP taking these medications   ferrous sulfate 325 (65 FE) MG EC tablet   ibuprofen 200 MG tablet Commonly known as:  ADVIL,MOTRIN   nicotine 21 mg/24hr patch Commonly known as:  NICODERM CQ - dosed in mg/24 hours   VITAMIN B-12 IJ   Vitamin D (Ergocalciferol) 1.25 MG (50000 UT) Caps capsule Commonly known as:  DRISDOL     TAKE these medications   Armodafinil 250  MG tablet Take 1 tablet (250 mg total) by mouth daily.   azelastine 0.1 % nasal spray Commonly known as:  ASTELIN Place 2 sprays into both nostrils 2 (two) times daily. Use in each nostril as directed What changed:    when to take this  reasons to take this   Carbinoxamine Maleate 6 MG Tabs Commonly known as:  RYVENT Take 6 mg by mouth 2 (two) times daily.   Fluticasone Propionate 93 MCG/ACT Exhu Commonly known as:  XHANCE Place 2 sprays into the nose 2 (two) times daily.   HYDROcodone-acetaminophen 5-325 MG tablet Commonly known as:  NORCO Take 1-2 tablets by mouth every 6 (six) hours as needed for moderate pain or severe pain.   levonorgestrel 20 MCG/24HR IUD Commonly known as:  MIRENA 1 each by Intrauterine route once. Inserted by GYN on 03/2015 needs to be removed 03/2020.   mupirocin cream 2 % Commonly known as:  BACTROBAN Apply to affected area 1-2  times daily What changed:    how much to take  how to take this  when to take this  reasons to take this  additional instructions   ranitidine 150 MG tablet Commonly known as:  ZANTAC TAKE 1 TABLET (150 MG TOTAL) BY MOUTH DAILY AT 12 NOON. What changed:    when to take this  reasons to take this   rizatriptan 10 MG disintegrating tablet Commonly known as:  MAXALT-MLT TAKE 1 TABLET (10 MG TOTAL) BY MOUTH AS NEEDED FOR MIGRAINE. MAY REPEAT IN 2 HOURS IF NEEDED   sertraline 50 MG tablet Commonly known as:  ZOLOFT Take 1 tablet (50 mg total) by mouth daily.   tiZANidine 4 MG tablet Commonly known as:  ZANAFLEX Take 4 mg by mouth every 6 (six) hours as needed for muscle spasms.   valACYclovir 500 MG tablet Commonly known as:  VALTREX Take 1 tablet (500 mg total) by mouth daily. What changed:    when to take this  reasons to take this

## 2018-09-20 NOTE — Progress Notes (Signed)
Pt ambulated in hallway on room air. Pt did report shortness of breath while walking, but O2 sats remained 92-94%. Pt advised to leave O2 off at rest and continue use of incentive spirometer. Mattia Osterman, Bing Neighbors, RN

## 2018-09-24 ENCOUNTER — Ambulatory Visit: Payer: Self-pay | Admitting: *Deleted

## 2018-09-24 ENCOUNTER — Telehealth: Payer: Self-pay | Admitting: Physician Assistant

## 2018-09-24 NOTE — Telephone Encounter (Signed)
Copied from [place] [id number]. Topic: Quick Communication - See Telephone Encounter >> Sep 24, 2018  3:40 PM Blase Mess A wrote: CRM for notification. See Telephone encounter for: 09/24/18.  Patient is calling to check on the status of her Tamaflu prescription. Please advise [phone] [id number] Mackenzie Meyers, [place] [phone] (Phone) [phone] (Fax)

## 2018-09-24 NOTE — Telephone Encounter (Signed)
Contacted pt; the pt says that her 48 year old was diagnosed with influenza A today; she states that she has a mild headache and she has a little bit of nasal congestion; the pt says that she just had surgery 7 days ago to remove a growth on her kidney, subsequently she developed atelectasis; the pt says that she was told to contact her PCP immediately; she received her flu shot 6 days ago; pt is staying with her mom while she recovers from surgery and if something can be called in she would need that to go to [id number] - [place], [place] [phone] (Phone) [phone] (Fax); will route to office for final disposition.  Reason for Disposition . [1] Influenza EXPOSURE (Close Contact) within last 48 hours (2 days) AND [2] exposed person is HIGH RISK (e.g., age > 43 years, pregnant, HIV+, chronic medical condition)  Answer Assessment - Initial Assessment Questions 1. TYPE of EXPOSURE: "How were you exposed?" (e.g., close contact, not a close contact)     Pt's 48 year old diagnosed 09/24/2018 2. DATE of EXPOSURE: "When did the exposure occur?" (e.g., hour, days, weeks)     hours 3. PREGNANCY: "Is there any chance you are pregnant?" "When was your last menstrual period?"     mirena 4. HIGH RISK for COMPLICATIONS: "Do you have any heart or lung problems? Do you have a weakened immune system?" (e.g., CHF, COPD, asthma, HIV positive, chemotherapy, renal failure, diabetes mellitus, sickle cell anemia)     Pt had surgery 7 days ago 5. SYMPTOMS: "Do you have any symptoms?" (e.g., cough, fever, sore throat, difficulty breathing).     Mild nasal congestion, headache  Protocols used: INFLUENZA EXPOSURE-A-AH

## 2018-09-24 NOTE — Telephone Encounter (Signed)
See triage note.

## 2018-09-24 NOTE — Telephone Encounter (Signed)
She is vaccinated - do not think she needs prophylactic tamiflu though recommend letting us know ASAP if she develops any signs or symptoms of flu.   Algis Greenhouse. Jerline Pain, MD 09/24/2018 5:05 PM

## 2018-09-24 NOTE — Telephone Encounter (Signed)
Please advise as Carlyle Dolly and Juleen China are both out.

## 2018-09-24 NOTE — Telephone Encounter (Signed)
See note

## 2018-09-25 MED ORDER — OSELTAMIVIR PHOSPHATE 75 MG PO CAPS: 75.0000 mg | ORAL_CAPSULE | Freq: Every day | ORAL | 0 refills | Status: DC

## 2018-09-25 NOTE — Addendum Note (Signed)
Addended by: Marian Sorrow on: 09/25/2018 12:02 PM   Modules accepted: Orders

## 2018-09-25 NOTE — Telephone Encounter (Signed)
Spoke to pt, asked her if she is having any Flu like symptoms like: fever, chills, headaches, cough, nasal congestion? Pt denies all symptoms and said daughter was diagnosed with Flu A and was told to call for Tamiflu. Told pt okay Aldona Bar said okay to send in Tamiflu due to no symptoms. Told pt if you develop any symptoms please call and schedule appt. I will send Rx to pharmacy. Pt verbalized understanding. Rx sent.

## 2018-09-25 NOTE — Telephone Encounter (Signed)
Mackenzie Meyers please see message and advise. 

## 2018-09-25 NOTE — Telephone Encounter (Signed)
Butch Penny please call and speak with patient. Is she having any symptoms? If not, lets give her the prohylactic tamiflu given her recent surgery. If she is having symptoms, can she come in so I can make sure nothing else is going on?

## 2018-09-25 NOTE — Telephone Encounter (Signed)
Spoke with patient and gave Dr. Marigene Ehlers advice.  She states she was vaccinated while in the hospital, so she was exposed to the flu before she was vaccinated.  Also states she has "partially collapsed lungs" from her surgery.  Says she "wants the prescription".  Advised patient I would send this to her PCP for further disposition.  Please advise.

## 2018-09-28 ENCOUNTER — Encounter: Payer: Self-pay | Admitting: Physician Assistant

## 2018-09-28 ENCOUNTER — Other Ambulatory Visit: Payer: Self-pay | Admitting: *Deleted

## 2018-09-28 MED ORDER — OSELTAMIVIR PHOSPHATE 75 MG PO CAPS: 75.0000 mg | ORAL_CAPSULE | Freq: Every day | ORAL | 0 refills | Status: DC

## 2018-10-02 ENCOUNTER — Ambulatory Visit (INDEPENDENT_AMBULATORY_CARE_PROVIDER_SITE_OTHER): Payer: PRIVATE HEALTH INSURANCE | Admitting: Physician Assistant

## 2018-10-02 ENCOUNTER — Other Ambulatory Visit: Payer: Self-pay | Admitting: Physician Assistant

## 2018-10-02 ENCOUNTER — Encounter: Payer: Self-pay | Admitting: Physician Assistant

## 2018-10-02 VITALS — BP 120/74 | HR 68 | Temp 98.7°F | Ht 64.0 in | Wt 190.5 lb

## 2018-10-02 DIAGNOSIS — Z720 Tobacco use: Secondary | ICD-10-CM

## 2018-10-02 DIAGNOSIS — E669 Obesity, unspecified: Secondary | ICD-10-CM

## 2018-10-02 DIAGNOSIS — M25541 Pain in joints of right hand: Secondary | ICD-10-CM | POA: Diagnosis not present

## 2018-10-02 DIAGNOSIS — F419 Anxiety disorder, unspecified: Secondary | ICD-10-CM | POA: Diagnosis not present

## 2018-10-02 DIAGNOSIS — E8881 Metabolic syndrome: Secondary | ICD-10-CM

## 2018-10-02 NOTE — Patient Instructions (Signed)
I am so pleased with your progress!  Keep up the good work. I will be in touch with your labs and we will go from there.  Take care, Aldona Bar

## 2018-10-02 NOTE — Progress Notes (Signed)
Mackenzie Meyers is a 48 y.o. female is here to follow up on Anxiety and smoking cessation.  I acted as a Education administrator for [employer], PA-C Anselmo Pickler, LPN  History of Present Illness:   Chief Complaint  Patient presents with  . Anxiety  . Discuss Smoking Cessation    HPI  Anxiety Pt here for follow up, she is doing better since surgery is over and they got it all, found out pathology and she is cancer free. Pt stopped Zoloft 1/9 due to diarrhea.  She is doing well without the medication but if anxiety worsens, she plans to let us know and is open to trialing other medications.  GAD 7 : Generalized Anxiety Score 10/02/2018 08/11/2018 11/18/2017 10/01/2016  Nervous, Anxious, on Edge 1 3 0 2  Control/stop worrying 1 0 0 1  Worry too much - different things 1 1 0 1  Trouble relaxing 1 1 0 3  Restless 1 1 0 3  Easily annoyed or irritable 2 3 0 1  Afraid - awful might happen 0 0 0 0  Total GAD 7 Score 7 9 0 11  Anxiety Difficulty Not difficult at all Not difficult at all Not difficult at all -   Smoking Cessation Pt quit smoking 08/25/2018 6 weeks ago. Pt is doing well with this. She quit cold [place].  Wt Readings from Last 5 Encounters:  10/02/18 190 lb 8 oz (86.4 kg)  09/17/18 190 lb (86.2 kg)  09/10/18 190 lb (86.2 kg)  08/31/18 190 lb 4 oz (86.3 kg)  08/17/18 187 lb (84.8 kg)   R hand 4th finger pain Over the past couple weeks has had some pain to the right fourth finger. Denies trauma. Had some issues with the right third finger but this resolved. Has been told when she was younger that she might have some arthritis going on. Denies hx of gout. Denies significant swelling to digit, numbness/tingling, redness.  Obesity She is also interested in discussing weight loss today. She was losing weight well prior to her kidney surgery. She is now interested in getting weight loss back on track. She is working on [employer], mostly mediterranean. She also states that she is planning  to resume activity as able.  Lab Results  Component Value Date   HGBA1C 5.7 12/19/2017     There are no preventive care reminders to display for this patient.  Past Medical History:  Diagnosis Date  . Anxiety    currently managed on CBD oil, was on medication briefly but made her OCD worse  . Arthritis    hands, DDD, and back 12-2017  . Breast mass, right 09/2012   removed because it was painful, was found to be calcified scar tissue  . Cancer ([place])    left renal mass  . Cervical dysplasia age 12  . Complication of anesthesia    Cries coming out of anesthesia   . Family history of adverse reaction to anesthesia    Maternal grandmother PONV  . Geographical tongue   . Gestational diabetes    gestational  . 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 History of kidney stones 10/2011  . 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 Hx of migraines   . OCD (obsessive compulsive disorder)    cleanliness with home and workspace  . Rosacea   . Sinus congestion 10/12/2012  . Sleep apnea 04/2017  patient is wearing C-pap  . Tobacco abuse 1989   max was 1.5 packs per [place]  . Vitamin D deficiency 10/2013   Value 29  . Vitiligo      Social History   Socioeconomic History  . Marital status: Married    Spouse name: Not on file  . Number of children: 3  . Years of education: Not on file  . Highest education level: Not on file  Occupational History  . Not on file  Social Needs  . Financial resource strain: Not hard at all  . Food insecurity:    Worry: Never true    Inability: Never true  . Transportation needs:    Medical: No    Non-medical: No  Tobacco Use  . Smoking status: Former Smoker    Packs/[place]: 1.00    Types: Cigarettes    Last attempt to quit: 08/25/2018    Years since quitting: 0.1  . Smokeless tobacco: Never Used  . Tobacco comment: restarted 10/17 d/t husband losing job  Substance and Sexual Activity    . Alcohol use: Not Currently    Comment: rare, twice a year  . Drug use: No  . Sexual activity: Yes    Partners: Male    Birth control/protection: I.U.D.    Comment: Mirena inserted 03-2015-1st intercourse 48 yo-More than 5 partners  Lifestyle  . Physical activity:    Days per week: 2 days    Minutes per session: 30 min  . Stress: To some extent  Relationships  . Social connections:    Talks on phone: Patient refused    Gets together: Patient refused    Attends religious service: Patient refused    Active member of club or organization: Patient refused    Attends meetings of clubs or organizations: Patient refused    Relationship status: Patient refused  . Intimate partner violence:    Fear of current or ex partner: No    Emotionally abused: No    Physically abused: No    Forced sexual activity: No  Other Topics Concern  . Not on file  Social History Narrative   Homemaker   2 boys (65 and 69)   58.46 year old girl   Does Medieval recreator; now sews costumes    Past Surgical History:  Procedure Laterality Date  . ABDOMINOPLASTY  2008  . BREAST BIOPSY Right 10/15/2012   Procedure: BREAST BIOPSY;  Surgeon: Haywood Lasso, MD;  Location: [facility];  Service: General;  Laterality: Right;  removal right breast mass  . BREAST EXCISIONAL BIOPSY Right   . BREAST SURGERY     Lift  . CESAREAN SECTION  2004, 2007  . CESAREAN SECTION N/A 02/07/2015   Procedure: CESAREAN SECTION;  Surgeon: Jerelyn Charles, MD;  Location: [facility];  Service: Obstetrics;  Laterality: N/A;  EDD: 02/13/15   . dialation and cutterage    . GYNECOLOGIC CRYOSURGERY  age 65  . INTRAUTERINE DEVICE INSERTION  03/2015   Mirena at [facility]  . MICRODISCECTOMY LUMBAR     12-2017   . REDUCTION MAMMAPLASTY Bilateral   . ROBOTIC ASSITED PARTIAL NEPHRECTOMY Left 09/17/2018   Procedure: XI ROBOTIC ASSITED PARTIAL NEPHRECTOMY;  Surgeon: Cleon Gustin, MD;  Location: [facility];  Service:  Urology;  Laterality: Left;  . TONSILLECTOMY  2000    Family History  Problem Relation Age of Onset  . Arthritis Mother   . High Cholesterol Mother   . High blood pressure Mother   .  Hearing loss Father   . High Cholesterol Father   . High blood pressure Father   . Cancer Maternal Grandfather        lymphoma  . Cancer Paternal Grandfather        liver  . Alcohol abuse Paternal Grandfather   . Lung cancer Maternal Grandmother        and maternal uncle; small cell   . Arthritis Maternal Grandmother   . Hyperlipidemia Maternal Grandmother   . Hypertension Maternal Grandmother   . Hearing loss Maternal Grandmother   . Cancer Maternal Uncle        Lung cancer  . Hyperlipidemia Other   . Asthma Brother   . Alcohol abuse Paternal Grandmother   . Hypertension Paternal Grandmother   . Eczema Son   . Colon cancer Neg Hx   . Breast cancer Neg Hx   . Allergic rhinitis Neg Hx   . Angioedema Neg Hx   . Urticaria Neg Hx     PMHx, SurgHx, SocialHx, FamHx, Medications, and Allergies were reviewed in the Visit Navigator and updated as appropriate.   Patient Active Problem List   Diagnosis Date Noted  . 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 Excessive daytime sleepiness 12/25/2017  . Hypersomnia, persistent 12/25/2017  . 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 Lumbar herniated disc 12/25/2017  . Obstructive sleep apnea treated with continuous positive airway pressure (CPAP) 12/08/2017  . Lumbar back pain with radiculopathy affecting left lower extremity 11/24/2017  . History of gestational diabetes mellitus 11/18/2017  . OCD (obsessive compulsive disorder)   . Hx of migraines   . Arthritis   . Sleep apnea 04/26/2017  . Vitamin D deficiency 10/24/2013  . History of kidney stones 10/25/2011  . Vitiligo   . Anxiety   . Tobacco abuse 08/27/1987    Social History   Tobacco Use  . Smoking status: Former Smoker    Packs/[facility]: 1.00    Types: Cigarettes    Last attempt to quit: 08/25/2018    Years since  quitting: 0.1  . Smokeless tobacco: Never Used  . Tobacco comment: restarted 10/17 d/t husband losing job  Substance Use Topics  . Alcohol use: Not Currently    Comment: rare, twice a year  . Drug use: No    Current Medications and Allergies:    Current Outpatient Medications:  .  azelastine (ASTELIN) 0.1 % nasal spray, Place 2 sprays into both nostrils 2 (two) times daily. Use in each nostril as directed (Patient taking differently: Place 2 sprays into both nostrils daily as needed for rhinitis or allergies. Use in each nostril as directed), Disp: 30 mL, Rfl: 5 .  Carbinoxamine Maleate (RYVENT) 6 MG TABS, Take 6 mg by mouth 2 (two) times daily., Disp: 60 tablet, Rfl: 5 .  Fluticasone Propionate (XHANCE) 93 MCG/ACT EXHU, Place 2 sprays into the nose 2 (two) times daily., Disp: 36 mL, Rfl: 5 .  levonorgestrel (MIRENA) 20 MCG/24HR IUD, 1 each by Intrauterine route once. Inserted by GYN on 03/2015 needs to be removed 03/2020., Disp: , Rfl:  .  mupirocin cream (BACTROBAN) 2 %, Apply to affected area 1-2 times daily (Patient taking differently: Apply 1 application topically 2 (two) times daily as needed (skin irritation). Apply to affected area 1-2 times daily as needed), Disp: 15 g, Rfl: 0 .  oseltamivir (TAMIFLU) 75 MG capsule, Take 1 capsule (75 mg total) by mouth daily., Disp: 10 capsule, Rfl: 0 .  rizatriptan (MAXALT-MLT) 10 MG disintegrating tablet, TAKE 1 TABLET (10  MG TOTAL) BY MOUTH AS NEEDED FOR MIGRAINE. MAY REPEAT IN 2 HOURS IF NEEDED, Disp: 9 tablet, Rfl: 1 .  tiZANidine (ZANAFLEX) 4 MG tablet, Take 4 mg by mouth every 6 (six) hours as needed for muscle spasms., Disp: , Rfl:  .  valACYclovir (VALTREX) 500 MG tablet, Take 1 tablet (500 mg total) by mouth daily. (Patient taking differently: Take 500 mg by mouth daily as needed (fever blisters). ), Disp: 90 tablet, Rfl: 4   Allergies  Allergen Reactions  . Ampicillin Hives  . Dust Mite Extract Cough  . Erythromycin Nausea And Vomiting    . Mold Extract [Trichophyton] Cough  . Other Hives    SAUERKRAUT  . Wine [Alcohol] Hives  . Ciprofloxacin Hives and Rash    Review of Systems   Review of Systems  Constitutional: Negative for chills, fever, malaise/fatigue and weight loss.  Cardiovascular: Negative for chest pain, leg swelling and PND.  Musculoskeletal: Positive for joint pain. Negative for myalgias and neck pain.  Psychiatric/Behavioral: Negative for depression, substance abuse and suicidal ideas. The patient is not nervous/anxious.     Vitals:   Vitals:   10/02/18 1031  BP: 120/74  Pulse: 68  Temp: 98.7 F (37.1 C)  TempSrc: Oral  SpO2: 97%  Weight: 190 lb 8 oz (86.4 kg)  Height: 5\' 4"  (1.626 m)     Body mass index is 32.7 kg/m.   Physical Exam:    Physical Exam Constitutional:      Appearance: She is well-developed.  HENT:     Head: Normocephalic and atraumatic.  Eyes:     Conjunctiva/sclera: Conjunctivae normal.  Neck:     Musculoskeletal: Normal range of motion and neck supple.  Pulmonary:     Effort: Pulmonary effort is normal.  Musculoskeletal: Normal range of motion.     Comments: R fourth finger DIP joint with TTP. No erythema or significant swelling noted. Normal ROM.  Skin:    General: Skin is warm and dry.  Neurological:     Mental Status: She is alert and oriented to person, place, and time.  Psychiatric:        Behavior: Behavior normal.        Thought Content: Thought content normal.        Judgment: Judgment normal.      Assessment and Plan:    Mackenzie Meyers was seen today for anxiety and discuss smoking cessation.  Diagnoses and all orders for this visit:  Insulin resistance; Obesity, unspecified classification, unspecified obesity type, unspecified whether serious comorbidity present Will recheck HgbA1c. Consider starting Metformin if needed. Continue Mediterranean diet. Activity per discretion of surgeon. -     Hemoglobin A1c; Future  Arthralgia of right hand Unclear  etiology. Doesn't have strong gout presentation but will check uric acid. Watchful waiting. May trial RICE. Consider imaging or further labs if needed. -     Uric acid; Future  Tobacco abuse She has completely stopped smoking. Congratulated patient.  Anxiety Overall controlled presently. She will follow-up as needed if exacerbates.   . Reviewed expectations re: course of current medical issues. . Discussed self-management of symptoms. . Outlined signs and symptoms indicating need for more acute intervention. . Patient verbalized understanding and all questions were answered. . See orders for this visit as documented in the electronic medical record. . Patient received an After Visit Summary.  CMA or LPN served as scribe during this visit. History, Physical, and Plan performed by medical provider. The above documentation has been reviewed  and is accurate and complete.  Inda Coke, PA-C [place], [place] 10/02/2018  Follow-up: No follow-ups on file.

## 2018-10-05 ENCOUNTER — Other Ambulatory Visit: Payer: Self-pay | Admitting: Physician Assistant

## 2018-10-05 ENCOUNTER — Other Ambulatory Visit (INDEPENDENT_AMBULATORY_CARE_PROVIDER_SITE_OTHER): Payer: PRIVATE HEALTH INSURANCE

## 2018-10-05 DIAGNOSIS — M25541 Pain in joints of right hand: Secondary | ICD-10-CM

## 2018-10-05 DIAGNOSIS — E8881 Metabolic syndrome: Secondary | ICD-10-CM

## 2018-10-05 DIAGNOSIS — Z13220 Encounter for screening for lipoid disorders: Secondary | ICD-10-CM

## 2018-10-05 DIAGNOSIS — F419 Anxiety disorder, unspecified: Secondary | ICD-10-CM

## 2018-10-05 DIAGNOSIS — Z136 Encounter for screening for cardiovascular disorders: Secondary | ICD-10-CM | POA: Diagnosis not present

## 2018-10-05 LAB — URIC ACID: Uric Acid, Serum: 5.4 mg/dL (ref 2.4–7.0)

## 2018-10-05 LAB — HEMOGLOBIN A1C: Hgb A1c MFr Bld: 5.5 % (ref 4.6–6.5)

## 2018-10-05 LAB — COMPREHENSIVE METABOLIC PANEL
ALT: 11 U/L (ref 0–35)
AST: 13 U/L (ref 0–37)
Albumin: 4.1 g/dL (ref 3.5–5.2)
Alkaline Phosphatase: 90 U/L (ref 39–117)
BUN: 12 mg/dL (ref 6–23)
CO2: 27 mEq/L (ref 19–32)
Calcium: 9.2 mg/dL (ref 8.4–10.5)
Chloride: 105 mEq/L (ref 96–112)
Creatinine, Ser: 0.63 mg/dL (ref 0.40–1.20)
GFR: 102.05 mL/min (ref >60.00)
Glucose, Bld: 95 mg/dL (ref 70–99)
Potassium: 4.2 mEq/L (ref 3.5–5.1)
Sodium: 139 mEq/L (ref 135–145)
Total Bilirubin: 0.7 mg/dL (ref 0.2–1.2)
Total Protein: 6.6 g/dL (ref 6.0–8.3)

## 2018-10-05 LAB — LIPID PANEL
Cholesterol: 156 mg/dL (ref 0–200)
HDL: 41.5 mg/dL (ref >39.00)
LDL Cholesterol: 95 mg/dL (ref 0–99)
NonHDL: 114.44
Total CHOL/HDL Ratio: 4
Triglycerides: 99 mg/dL (ref 0.0–149.0)
VLDL: 19.8 mg/dL (ref 0.0–40.0)

## 2018-10-06 ENCOUNTER — Encounter: Payer: PRIVATE HEALTH INSURANCE | Admitting: Gynecology

## 2018-10-06 ENCOUNTER — Encounter: Payer: Self-pay | Admitting: Physician Assistant

## 2018-10-28 ENCOUNTER — Encounter: Payer: Self-pay | Admitting: Gynecology

## 2018-10-28 ENCOUNTER — Ambulatory Visit (INDEPENDENT_AMBULATORY_CARE_PROVIDER_SITE_OTHER): Payer: PRIVATE HEALTH INSURANCE | Admitting: Gynecology

## 2018-10-28 VITALS — BP 122/78 | Ht 65.0 in | Wt 196.0 lb

## 2018-10-28 DIAGNOSIS — N951 Menopausal and female climacteric states: Secondary | ICD-10-CM

## 2018-10-28 DIAGNOSIS — Z01419 Encounter for gynecological examination (general) (routine) without abnormal findings: Secondary | ICD-10-CM | POA: Diagnosis not present

## 2018-10-28 DIAGNOSIS — N898 Other specified noninflammatory disorders of vagina: Secondary | ICD-10-CM | POA: Diagnosis not present

## 2018-10-28 DIAGNOSIS — Z30431 Encounter for routine checking of intrauterine contraceptive device: Secondary | ICD-10-CM

## 2018-10-28 MED ORDER — FLUCONAZOLE 150 MG PO TABS: 150.0000 mg | ORAL_TABLET | Freq: Once | ORAL | 0 refills | Status: AC

## 2018-10-28 NOTE — Patient Instructions (Signed)
Office will let you know your hormone test results

## 2018-10-28 NOTE — Progress Notes (Signed)
    Mackenzie Meyers 05-18-1971 [id number]        48 y.o.  [id number] for annual gynecologic exam.  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  She is having some hot flushes and sweats.  Remains amenorrheic from her Mirena IUD.  Also notes some vaginal itching and discharge.  Has recently completed a 10-[place] course of oral antibiotics.  Past medical history,surgical history, problem list, medications, allergies, family history and social history were all reviewed and documented as reviewed in the EPIC chart.  ROS:  Performed with pertinent positives and negatives included in the history, assessment and plan.   Additional significant findings : None   Exam: [name] assistant Vitals:   10/28/18 1430  BP: 122/78  Weight: 196 lb (88.9 kg)  Height: 5\' 5"  (1.651 m)   Body mass index is 32.62 kg/m.  General appearance:  Normal affect, orientation and appearance. Skin: Grossly normal HEENT: Without gross lesions.  No cervical or supraclavicular adenopathy. Thyroid normal.  Lungs:  Clear without wheezing, rales or rhonchi Cardiac: RR, without RMG Abdominal:  Soft, nontender, without masses, guarding, rebound, organomegaly or hernia Breasts:  Examined lying and sitting without masses, retractions, discharge or axillary adenopathy. Pelvic:  Ext, BUS, Vagina: Normal with slight white discharge  Cervix: Normal.  IUD string visualized.  Pap smear done  Uterus: Anteverted, normal size, shape and contour, midline and mobile nontender   Adnexa: Without masses or tenderness    Anus and perineum: Normal   Rectovaginal: Normal sphincter tone without palpated masses or tenderness.    Assessment/Plan:  48 y.o. [id number] female for annual gynecologic exam.  Without menses, Mirena IUD  1. Mirena IUD 03/2015.  Doing well without menses.  Is having some hot flashes and sweats.  Will check baseline TSH and FSH. 2. Vaginal discharge with itching.  Following course of antibiotics.  Will treat  with Diflucan 150 mg x 1 dose.  Follow-up if symptoms persist or recur. 3. Mammography 12/2017.  Continue with annual mammography when due.  Breast exam normal today. 4. Pap smear 2017.  Pap smear done today.  History of normal cytology positive high risk HPV 2016.  Pap smear/HPV 2018 was negative. 5. History of recurrent herpes labialis.  Uses Valtrex 500 mg daily for suppression. 6. Health maintenance.  No routine lab work done as patient has this done at her primary physician's office.  Follow-up 1 year, sooner as needed.   [name] MD, 2:54 PM 10/28/2018

## 2018-10-28 NOTE — Addendum Note (Signed)
Addended by: Nelva Nay on: 10/28/2018 03:24 PM   Modules accepted: Orders

## 2018-10-29 ENCOUNTER — Encounter: Payer: Self-pay | Admitting: Gynecology

## 2018-10-29 LAB — PAP IG W/ RFLX HPV ASCU

## 2018-10-29 LAB — FOLLICLE STIMULATING HORMONE: FSH: 2.3 m[IU]/mL

## 2018-10-29 LAB — TSH: TSH: 1.4 mIU/L

## 2018-11-12 ENCOUNTER — Other Ambulatory Visit: Payer: Self-pay | Admitting: *Deleted

## 2018-11-12 MED ORDER — AZELASTINE HCL 0.1 % NA SOLN: 2.0000 | Freq: Two times a day (BID) | NASAL | 0 refills | Status: DC

## 2018-11-19 ENCOUNTER — Encounter: Payer: Self-pay | Admitting: Physician Assistant

## 2018-11-20 ENCOUNTER — Encounter: Payer: Self-pay | Admitting: Physician Assistant

## 2018-11-20 ENCOUNTER — Ambulatory Visit (INDEPENDENT_AMBULATORY_CARE_PROVIDER_SITE_OTHER): Payer: PRIVATE HEALTH INSURANCE | Admitting: Physician Assistant

## 2018-11-20 VITALS — Temp 98.6°F | Ht 65.0 in | Wt 197.0 lb

## 2018-11-20 DIAGNOSIS — Z720 Tobacco use: Secondary | ICD-10-CM | POA: Diagnosis not present

## 2018-11-20 DIAGNOSIS — F419 Anxiety disorder, unspecified: Secondary | ICD-10-CM

## 2018-11-20 DIAGNOSIS — E669 Obesity, unspecified: Secondary | ICD-10-CM | POA: Diagnosis not present

## 2018-11-20 MED ORDER — PHENTERMINE HCL 37.5 MG PO TABS: ORAL_TABLET | ORAL | 0 refills | Status: DC

## 2018-11-20 MED ORDER — SERTRALINE HCL 50 MG PO TABS: 50.0000 mg | ORAL_TABLET | Freq: Every day | ORAL | 1 refills | Status: DC

## 2018-11-20 MED ORDER — NICOTINE 21 MG/24HR TD PT24: 21.0000 mg | MEDICATED_PATCH | Freq: Every day | TRANSDERMAL | 1 refills | Status: DC

## 2018-11-20 NOTE — Progress Notes (Signed)
Virtual Visit via Video   I connected with Mackenzie Meyers on 11/20/18 at  1:20 PM EDT by a video enabled telemedicine application and verified that I am speaking with the correct person using two identifiers. Location patient: Home Location provider: [facility], Office Persons participating in the virtual visit: Saide D La[place], Inda Coke, [place], Inda Coke, PA-C  I discussed the limitations of evaluation and management by telemedicine and the availability of in person appointments. The patient expressed understanding and agreed to proceed.  Subjective:  I acted as as Education administrator for [employer], PA-C [employer], LPN HPI:   Anxiety Pt says her anxiety has increased over the past 2 weeks due to the coronavirus and also daughter was diagnosed with asthma last week. Pt said her cancer is in remission as of last visit 3/5 with urologist. Her lower incision had gotten infected and Dr.Craven had to cut all the infection out and had to pack it in the office. Pt taught how to pack and clean incision. Incision is healing well now per pt. Pt said she would like to go back on Zoloft. Was on zoloft 50 mg prior to surgery and feels like she would do well with going back on this. She would also like to start this so she can help use it to get her to stop smoking; this was effective with nicotine patches in the past.  Weight loss She is working on diet and exercise as able. Goal is 180 lb. Shes had some weight gain since her surgery.   ROS: See pertinent positives and negatives per HPI.  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   Lumbar herniated disc 12/25/2017   Obstructive sleep apnea treated with continuous positive airway pressure (CPAP) 12/08/2017   Lumbar back pain with radiculopathy affecting left lower extremity 11/24/2017   History of  gestational diabetes mellitus 11/18/2017   OCD (obsessive compulsive disorder)    Hx of migraines    Arthritis    Sleep apnea 04/26/2017   Vitamin D deficiency 10/24/2013   History of kidney stones 10/25/2011   Vitiligo    Anxiety    Tobacco abuse 08/27/1987    Social History   Tobacco Use   Smoking status: Former Smoker    Packs/[place]: 1.00    Types: Cigarettes    Last attempt to quit: 08/25/2018    Years since quitting: 0.2   Smokeless tobacco: Never Used   Tobacco comment: restarted 10/17 d/t husband losing job  Substance Use Topics   Alcohol use: Not Currently    Comment: rare, twice a year    Current Outpatient Medications:    azelastine (ASTELIN) 0.1 % nasal spray, Place 2 sprays into both nostrils 2 (two) times daily. Use in each nostril as directed, Disp: 30 mL, Rfl: 0   Carbinoxamine Maleate (RYVENT) 6 MG TABS, Take 6 mg by mouth 2 (two) times daily., Disp: 60 tablet, Rfl: 5   Cholecalciferol (VITAMIN D3) 125 MCG (5000 UT) CAPS, Take 1 capsule by mouth daily., Disp: , Rfl:    Fluticasone Propionate (XHANCE) 93 MCG/ACT EXHU, Place 2 sprays into the nose 2 (two) times daily., Disp: 36 mL, Rfl: 5   ibuprofen (ADVIL,MOTRIN) 200 MG tablet, Take 800 mg by mouth as needed., Disp: , Rfl:    levonorgestrel (MIRENA) 20 MCG/24HR IUD, 1 each by Intrauterine route once. Inserted  by GYN on 03/2015 needs to be removed 03/2020., Disp: , Rfl:    mupirocin cream (BACTROBAN) 2 %, Apply to affected area 1-2 times daily (Patient taking differently: Apply 1 application topically 2 (two) times daily as needed (skin irritation). Apply to affected area 1-2 times daily as needed), Disp: 15 g, Rfl: 0   rizatriptan (MAXALT-MLT) 10 MG disintegrating tablet, TAKE 1 TABLET (10 MG TOTAL) BY MOUTH AS NEEDED FOR MIGRAINE. MAY REPEAT IN 2 HOURS IF NEEDED, Disp: 9 tablet, Rfl: 1   tiZANidine (ZANAFLEX) 4 MG tablet, Take 4 mg by mouth every 6 (six) hours as needed for muscle spasms., Disp: ,  Rfl:    valACYclovir (VALTREX) 500 MG tablet, Take 1 tablet (500 mg total) by mouth daily. (Patient taking differently: Take 500 mg by mouth daily as needed (fever blisters). ), Disp: 90 tablet, Rfl: 4   vitamin B-12 (CYANOCOBALAMIN) 1000 MCG tablet, Take 1,000 mcg by mouth daily., Disp: , Rfl:    nicotine (NICODERM CQ - DOSED IN MG/24 HOURS) 21 mg/24hr patch, Place 1 patch (21 mg total) onto the skin daily., Disp: 14 patch, Rfl: 1   phentermine (ADIPEX-P) 37.5 MG tablet, Take 1/2 tablet daily x 2 weeks, then if tolerated, may increase to 1 full tablet daily, Disp: 30 tablet, Rfl: 0   sertraline (ZOLOFT) 50 MG tablet, Take 1 tablet (50 mg total) by mouth daily., Disp: 90 tablet, Rfl: 1  Allergies  Allergen Reactions   Ampicillin Hives   Dust Mite Extract Cough   Erythromycin Nausea And Vomiting   Mold Extract [Trichophyton] Cough   Other Hives    SAUERKRAUT   Wine [Alcohol] Hives   Ciprofloxacin Hives and Rash   Body mass index is 32.78 kg/m.   Objective:   VITALS: Per patient if applicable, see vitals. GENERAL: Alert, appears well and in no acute distress. HEENT: Atraumatic, conjunctiva clear, no obvious abnormalities on inspection of external nose and ears. NECK: Normal movements of the head and neck. CARDIOPULMONARY: No increased WOB. Speaking in clear sentences. I:E ratio WNL.  MS: Moves all visible extremities without noticeable abnormality. PSYCH: Pleasant and cooperative, well-groomed. Speech normal rate and rhythm. Affect is appropriate. Insight and judgement are appropriate. Attention is focused, linear, and appropriate.  NEURO: CN grossly intact. Oriented as arrived to appointment on time with no prompting. Moves both UE equally.  SKIN: No obvious lesions, wounds, erythema, or cyanosis noted on face or hands.  Assessment and Plan:   Caraline was seen today for anxiety.  Diagnoses and all orders for this visit:  Obesity, unspecified classification,  unspecified obesity type, unspecified whether serious comorbidity present Will start phentermine in two weeks (wait two weeks while restarting zoloft to prevent too many new meds at once) and start with 1/2 tab daily. After two weeks, may increase to full tab if needed. Follow-up for further refills. Side effects reviewed with patient.  Anxiety Uncontrolled. Resume Zoloft 50 mg.   Tobacco abuse Patches sent. Encouraged cessation.  Other orders -     phentermine (ADIPEX-P) 37.5 MG tablet; Take 1/2 tablet daily x 2 weeks, then if tolerated, may increase to 1 full tablet daily -     sertraline (ZOLOFT) 50 MG tablet; Take 1 tablet (50 mg total) by mouth daily. -     nicotine (NICODERM CQ - DOSED IN MG/24 HOURS) 21 mg/24hr patch; Place 1 patch (21 mg total) onto the skin daily.     Reviewed expectations re: course of current medical issues.  Discussed self-management of symptoms.  Outlined signs and symptoms indicating need for more acute intervention.  Patient verbalized understanding and all questions were answered.  Health Maintenance issues including appropriate healthy diet, exercise, and smoking avoidance were discussed with patient.  See orders for this visit as documented in the electronic medical record.  I discussed the assessment and treatment plan with the patient. The patient was provided an opportunity to ask questions and all were answered. The patient agreed with the plan and demonstrated an understanding of the instructions.   The patient was advised to call back or seek an in-person evaluation if the symptoms worsen or if the condition fails to improve as anticipated.  CMA or LPN served as scribe during this visit. History, Physical, and Plan performed by medical provider. The above documentation has been reviewed and is accurate and complete.      [place], [place] 11/20/2018

## 2018-11-26 ENCOUNTER — Encounter: Payer: Self-pay | Admitting: Physician Assistant

## 2018-12-16 ENCOUNTER — Encounter: Payer: Self-pay | Admitting: Physician Assistant

## 2018-12-18 ENCOUNTER — Encounter: Payer: Self-pay | Admitting: Physician Assistant

## 2019-01-07 ENCOUNTER — Encounter: Payer: Self-pay | Admitting: Physician Assistant

## 2019-01-07 MED ORDER — FLUCONAZOLE 150 MG PO TABS: ORAL_TABLET | ORAL | 0 refills | Status: DC

## 2019-01-07 NOTE — Telephone Encounter (Signed)
Spoke to pt, clarified pharmacy and told her will send Rx. Pt verbalized understanding and said she is having vaginal itching.

## 2019-02-18 DIAGNOSIS — K439 Ventral hernia without obstruction or gangrene: Secondary | ICD-10-CM | POA: Insufficient documentation

## 2019-02-24 ENCOUNTER — Encounter: Payer: Self-pay | Admitting: Physician Assistant

## 2019-03-03 HISTORY — PX: HERNIA REPAIR: SHX51

## 2019-03-18 ENCOUNTER — Other Ambulatory Visit: Payer: Self-pay | Admitting: Gynecology

## 2019-03-18 DIAGNOSIS — Z1231 Encounter for screening mammogram for malignant neoplasm of breast: Secondary | ICD-10-CM

## 2019-03-30 ENCOUNTER — Encounter: Payer: Self-pay | Admitting: Physician Assistant

## 2019-03-30 ENCOUNTER — Ambulatory Visit (INDEPENDENT_AMBULATORY_CARE_PROVIDER_SITE_OTHER): Payer: PRIVATE HEALTH INSURANCE | Admitting: Physician Assistant

## 2019-03-30 ENCOUNTER — Other Ambulatory Visit: Payer: Self-pay

## 2019-03-30 DIAGNOSIS — Z20828 Contact with and (suspected) exposure to other viral communicable diseases: Secondary | ICD-10-CM

## 2019-03-30 DIAGNOSIS — J029 Acute pharyngitis, unspecified: Secondary | ICD-10-CM | POA: Diagnosis not present

## 2019-03-30 DIAGNOSIS — Z20822 Contact with and (suspected) exposure to covid-19: Secondary | ICD-10-CM

## 2019-03-30 DIAGNOSIS — Z7189 Other specified counseling: Secondary | ICD-10-CM

## 2019-03-30 MED ORDER — AMOXICILLIN 875 MG PO TABS: 875.0000 mg | ORAL_TABLET | Freq: Two times a day (BID) | ORAL | 0 refills | Status: DC

## 2019-03-30 MED ORDER — ESOMEPRAZOLE MAGNESIUM 40 MG PO CPDR: 40.0000 mg | DELAYED_RELEASE_CAPSULE | Freq: Every day | ORAL | 0 refills | Status: DC | PRN

## 2019-03-30 NOTE — Progress Notes (Signed)
Virtual Visit via Video   I connected with Mackenzie Meyers on 03/30/19 at  7:40 AM EDT by a video enabled telemedicine application and verified that I am speaking with the correct person using two identifiers. Location patient: Home Location provider: [facility], Office Persons participating in the virtual visit: Sharlene D Shambaugh, Inda Coke PA-C.  I discussed the limitations of evaluation and management by telemedicine and the availability of in person appointments. The patient expressed understanding and agreed to proceed.  I acted as a Education administrator for [employer], [employer], LPN  Subjective:   HPI:   Sore throat Pt c/o sore throat x  3 days, hurts to swallow, some nasal congestion. Denies fever, chills, headaches, cough, no SOB. Using Ibuprofen with little relief. Doesn't have tonsils but feels like she could have strep.  She stopped smoking a week and a half ago!!  ROS: See pertinent positives and negatives per HPI.  Patient Active Problem List   Diagnosis Date Noted  . 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 Excessive daytime sleepiness 12/25/2017  . Hypersomnia, persistent 12/25/2017  . 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 Lumbar herniated disc 12/25/2017  . Obstructive sleep apnea treated with continuous positive airway pressure (CPAP) 12/08/2017  . Lumbar back pain with radiculopathy affecting left lower extremity 11/24/2017  . History of gestational diabetes mellitus 11/18/2017  . OCD (obsessive compulsive disorder)   . Hx of migraines   . Arthritis   . Sleep apnea 04/26/2017  . Vitamin D deficiency 10/24/2013  . History of kidney stones 10/25/2011  . Vitiligo   . Anxiety   . Tobacco abuse 08/27/1987    Social History   Tobacco Use  . Smoking status: Former Smoker    Packs/Mckinzie: 1.00    Types: Cigarettes    Quit date: 08/25/2018    Years since quitting: 0.5  . Smokeless tobacco: Never Used  . Tobacco comment: restarted 10/17 d/t husband losing job  Substance Use Topics   . Alcohol use: Not Currently    Comment: rare, twice a year    Current Outpatient Medications:  .  Cholecalciferol (VITAMIN D3) 125 MCG (5000 UT) CAPS, Take 1 capsule by mouth daily., Disp: , Rfl:  .  Fluticasone Propionate (XHANCE) 93 MCG/ACT EXHU, Place 2 sprays into the nose 2 (two) times daily., Disp: 36 mL, Rfl: 5 .  ibuprofen (ADVIL,MOTRIN) 200 MG tablet, Take 800 mg by mouth as needed., Disp: , Rfl:  .  levonorgestrel (MIRENA) 20 MCG/24HR IUD, 1 each by Intrauterine route once. Inserted by GYN on 03/2015 needs to be removed 03/2020., Disp: , Rfl:  .  mupirocin cream (BACTROBAN) 2 %, Apply to affected area 1-2 times daily (Patient taking differently: Apply 1 application topically 2 (two) times daily as needed (skin irritation). Apply to affected area 1-2 times daily as needed), Disp: 15 g, Rfl: 0 .  nicotine (NICODERM CQ - DOSED IN MG/24 HOURS) 21 mg/24hr patch, Place 1 patch (21 mg total) onto the skin daily., Disp: 14 patch, Rfl: 1 .  rizatriptan (MAXALT-MLT) 10 MG disintegrating tablet, TAKE 1 TABLET (10 MG TOTAL) BY MOUTH AS NEEDED FOR MIGRAINE. MAY REPEAT IN 2 HOURS IF NEEDED, Disp: 9 tablet, Rfl: 1 .  sertraline (ZOLOFT) 50 MG tablet, Take 1 tablet (50 mg total) by mouth daily., Disp: 90 tablet, Rfl: 1 .  tiZANidine (ZANAFLEX) 4 MG tablet, Take 4 mg by mouth every 6 (six) hours as needed for muscle spasms., Disp: , Rfl:  .  valACYclovir (VALTREX)  500 MG tablet, Take 1 tablet (500 mg total) by mouth daily. (Patient taking differently: Take 500 mg by mouth daily as needed (fever blisters). ), Disp: 90 tablet, Rfl: 4 .  vitamin B-12 (CYANOCOBALAMIN) 1000 MCG tablet, Take 1,000 mcg by mouth daily., Disp: , Rfl:  .  amoxicillin (AMOXIL) 875 MG tablet, Take 1 tablet (875 mg total) by mouth 2 (two) times daily., Disp: 20 tablet, Rfl: 0 .  esomeprazole (NEXIUM) 40 MG capsule, Take 1 capsule (40 mg total) by mouth daily as needed., Disp: 30 capsule, Rfl: 0 .  phentermine (ADIPEX-P) 37.5 MG  tablet, Take 1/2 tablet daily x 2 weeks, then if tolerated, may increase to 1 full tablet daily (Patient not taking: Reported on 03/30/2019), Disp: 30 tablet, Rfl: 0  Allergies  Allergen Reactions  . Ampicillin Hives    Can tolerate amoxicillin and penicillin  . Dust Mite Extract Cough  . Erythromycin Nausea And Vomiting  . Mold Extract [Trichophyton] Cough  . Other Hives    SAUERKRAUT  . Wine [Alcohol] Hives  . Ciprofloxacin Hives and Rash    Objective:   VITALS: Per patient if applicable, see vitals. GENERAL: Alert, appears well and in no acute distress. HEENT: Atraumatic, conjunctiva clear, no obvious abnormalities on inspection of external nose and ears. NECK: Normal movements of the head and neck. CARDIOPULMONARY: No increased WOB. Speaking in clear sentences. I:E ratio WNL.  MS: Moves all visible extremities without noticeable abnormality. PSYCH: Pleasant and cooperative, well-groomed. Speech normal rate and rhythm. Affect is appropriate. Insight and judgement are appropriate. Attention is focused, linear, and appropriate.  NEURO: CN grossly intact. Oriented as arrived to appointment on time with no prompting. Moves both UE equally.  SKIN: No obvious lesions, wounds, erythema, or cyanosis noted on face or hands.  Assessment and Plan:   Emmalise was seen today for sore throat.  Diagnoses and all orders for this visit:  Pharyngitis, unspecified etiology -     Novel Coronavirus, NAA ([facility])  Advice Given About Covid-19 Virus Infection -     Novel Coronavirus, NAA ([facility])  Other orders -     amoxicillin (AMOXIL) 875 MG tablet; Take 1 tablet (875 mg total) by mouth 2 (two) times daily. -     esomeprazole (NEXIUM) 40 MG capsule; Take 1 capsule (40 mg total) by mouth daily as needed.   Patient has a respiratory illness without signs of acute distress or respiratory compromise at this time. We are going to empirically treat for strep with amoxicillin. We are going to send  patient for drive-up testing. As a precaution, they have been advised to remain home until COVID-19 results and then possible further quarantine after that based on results and symptoms. Advised if they experience a "second sickening" or worsening symptoms as the illness progresses, they are to call the office for further instructions or seek emergent evaluation for any severe symptoms.   . Reviewed expectations re: course of current medical issues. . Discussed self-management of symptoms. . Outlined signs and symptoms indicating need for more acute intervention. . Patient verbalized understanding and all questions were answered. Marland Kitchen Health Maintenance issues including appropriate healthy diet, exercise, and smoking avoidance were discussed with patient. . See orders for this visit as documented in the electronic medical record.  I discussed the assessment and treatment plan with the patient. The patient was provided an opportunity to ask questions and all were answered. The patient agreed with the plan and demonstrated an understanding of the instructions.  The patient was advised to call back or seek an in-person evaluation if the symptoms worsen or if the condition fails to improve as anticipated.   CMA or LPN served as scribe during this visit. History, Physical, and Plan performed by medical provider. The above documentation has been reviewed and is accurate and complete.  [place], [place] 03/30/2019

## 2019-03-31 ENCOUNTER — Ambulatory Visit: Payer: PRIVATE HEALTH INSURANCE

## 2019-03-31 LAB — NOVEL CORONAVIRUS, NAA: SARS-CoV-2, NAA: NOT DETECTED

## 2019-04-02 ENCOUNTER — Other Ambulatory Visit: Payer: Self-pay | Admitting: Physician Assistant

## 2019-04-02 ENCOUNTER — Encounter: Payer: Self-pay | Admitting: Physician Assistant

## 2019-04-02 MED ORDER — FLUCONAZOLE 150 MG PO TABS: ORAL_TABLET | ORAL | 0 refills | Status: DC

## 2019-04-21 ENCOUNTER — Other Ambulatory Visit: Payer: Self-pay | Admitting: Physician Assistant

## 2019-05-15 ENCOUNTER — Other Ambulatory Visit: Payer: Self-pay | Admitting: Physician Assistant

## 2019-05-18 ENCOUNTER — Encounter: Payer: Self-pay | Admitting: Gynecology

## 2019-05-19 ENCOUNTER — Other Ambulatory Visit: Payer: Self-pay

## 2019-05-19 ENCOUNTER — Ambulatory Visit
Admission: RE | Admit: 2019-05-19 | Discharge: 2019-05-19 | Disposition: A | Discharge status: Home / Self Care | Payer: PRIVATE HEALTH INSURANCE | Source: Ambulatory Visit | Attending: Gynecology | Admitting: Gynecology

## 2019-05-19 DIAGNOSIS — Z1231 Encounter for screening mammogram for malignant neoplasm of breast: Secondary | ICD-10-CM

## 2019-06-09 ENCOUNTER — Telehealth: Payer: Self-pay

## 2019-06-09 NOTE — Telephone Encounter (Signed)
Copied from [place] [id number]. Topic: General - Other >> Jun 09, 2019  9:57 AM Leward Quan A wrote: Reason for CRM: Patient called to ask for clarification on someone in her family that came into indirect contact with someone that tested positive for covid. Per patient there are no symptoms but just need to know should he get tested. Also want to know if the whole family should quarantine for a certain period even though there was no direct contact to the person that tested positive.  Please advise Ph# [phone]

## 2019-06-09 NOTE — Telephone Encounter (Signed)
Please schedule for Virtual appointment to discuss with Mackenzie Meyers

## 2019-06-09 NOTE — Telephone Encounter (Signed)
LVM to schedule virtual

## 2019-06-09 NOTE — Telephone Encounter (Signed)
Virtual appt to discuss.

## 2019-06-10 ENCOUNTER — Encounter: Payer: Self-pay | Admitting: Physician Assistant

## 2019-06-10 ENCOUNTER — Ambulatory Visit (INDEPENDENT_AMBULATORY_CARE_PROVIDER_SITE_OTHER): Payer: PRIVATE HEALTH INSURANCE | Admitting: Physician Assistant

## 2019-06-10 DIAGNOSIS — Z7189 Other specified counseling: Secondary | ICD-10-CM | POA: Diagnosis not present

## 2019-06-10 NOTE — Progress Notes (Signed)
Virtual Visit via Video   I connected with Mackenzie Meyers on 06/10/19 at  7:40 AM EDT by a video enabled telemedicine application and verified that I am speaking with the correct person using two identifiers. Location patient: Home Location provider: [facility], Office Persons participating in the virtual visit: Mackenzie Meyers, Inda Coke PA-C  I discussed the limitations of evaluation and management by telemedicine and the availability of in person appointments. The patient expressed understanding and agreed to proceed.  Subjective:   HPI:   Patient reports that her son attends [employer].  Her son was exposed to a friend, who was contacted about a possible exposure at school.  Her son always wears a mask, per his report.  Her son was exposed to this person on Monday.  Her son has no symptoms.  Patient and her family have no symptoms.  She is wondering what the next steps are for her, if she needs to self isolate, go ahead and get tested, or any other interventions.  ROS: See pertinent positives and negatives per HPI.  Patient Active Problem List   Diagnosis Date Noted  . 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 Excessive daytime sleepiness 12/25/2017  . Hypersomnia, persistent 12/25/2017  . 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 Lumbar herniated disc 12/25/2017  . Obstructive sleep apnea treated with continuous positive airway pressure (CPAP) 12/08/2017  . Lumbar back pain with radiculopathy affecting left lower extremity 11/24/2017  . History of gestational diabetes mellitus 11/18/2017  . OCD (obsessive compulsive disorder)   . Hx of migraines   . Arthritis   . Sleep apnea 04/26/2017  . Vitamin D deficiency 10/24/2013  . History of kidney stones 10/25/2011  . Vitiligo   . Anxiety   . Tobacco abuse 08/27/1987    Social History   Tobacco Use  . Smoking status: Former Smoker    Packs/Bartz: 1.00    Types: Cigarettes    Quit date: 08/25/2018    Years since quitting: 0.7  . Smokeless tobacco:  Never Used  . Tobacco comment: restarted 10/17 d/t husband losing job  Substance Use Topics  . Alcohol use: Not Currently    Comment: rare, twice a year    Current Outpatient Medications:  .  amoxicillin (AMOXIL) 875 MG tablet, Take 1 tablet (875 mg total) by mouth 2 (two) times daily., Disp: 20 tablet, Rfl: 0 .  Cholecalciferol (VITAMIN D3) 125 MCG (5000 UT) CAPS, Take 1 capsule by mouth daily., Disp: , Rfl:  .  esomeprazole (NEXIUM) 40 MG capsule, TAKE 1 CAPSULE (40 MG TOTAL) BY MOUTH DAILY AS NEEDED., Disp: 30 capsule, Rfl: 2 .  fluconazole (DIFLUCAN) 150 MG tablet, Take one tablet. May repeat dose in 1 week if needed., Disp: 2 tablet, Rfl: 0 .  Fluticasone Propionate (XHANCE) 93 MCG/ACT EXHU, Place 2 sprays into the nose 2 (two) times daily., Disp: 36 mL, Rfl: 5 .  ibuprofen (ADVIL,MOTRIN) 200 MG tablet, Take 800 mg by mouth as needed., Disp: , Rfl:  .  levonorgestrel (MIRENA) 20 MCG/24HR IUD, 1 each by Intrauterine route once. Inserted by GYN on 03/2015 needs to be removed 03/2020., Disp: , Rfl:  .  mupirocin cream (BACTROBAN) 2 %, Apply to affected area 1-2 times daily (Patient taking differently: Apply 1 application topically 2 (two) times daily as needed (skin irritation). Apply to affected area 1-2 times daily as needed), Disp: 15 g, Rfl: 0 .  nicotine (NICODERM CQ - DOSED IN MG/24 HOURS) 21 mg/24hr patch, Place 1 patch (  21 mg total) onto the skin daily., Disp: 14 patch, Rfl: 1 .  phentermine (ADIPEX-P) 37.5 MG tablet, Take 1/2 tablet daily x 2 weeks, then if tolerated, may increase to 1 full tablet daily (Patient not taking: Reported on 03/30/2019), Disp: 30 tablet, Rfl: 0 .  rizatriptan (MAXALT-MLT) 10 MG disintegrating tablet, TAKE 1 TABLET (10 MG TOTAL) BY MOUTH AS NEEDED FOR MIGRAINE. MAY REPEAT IN 2 HOURS IF NEEDED, Disp: 9 tablet, Rfl: 1 .  sertraline (ZOLOFT) 50 MG tablet, TAKE 1 TABLET BY MOUTH EVERY Kier, Disp: 90 tablet, Rfl: 0 .  tiZANidine (ZANAFLEX) 4 MG tablet, Take 4 mg by  mouth every 6 (six) hours as needed for muscle spasms., Disp: , Rfl:  .  valACYclovir (VALTREX) 500 MG tablet, Take 1 tablet (500 mg total) by mouth daily. (Patient taking differently: Take 500 mg by mouth daily as needed (fever blisters). ), Disp: 90 tablet, Rfl: 4 .  vitamin B-12 (CYANOCOBALAMIN) 1000 MCG tablet, Take 1,000 mcg by mouth daily., Disp: , Rfl:   Allergies  Allergen Reactions  . Ampicillin Hives    Can tolerate amoxicillin and penicillin  . Dust Mite Extract Cough  . Erythromycin Nausea And Vomiting  . Mold Extract [Trichophyton] Cough  . Other Hives    SAUERKRAUT  . Wine [Alcohol] Hives  . Ciprofloxacin Hives and Rash    Objective:   VITALS: Per patient if applicable, see vitals. GENERAL: Alert, appears well and in no acute distress. HEENT: Atraumatic, conjunctiva clear, no obvious abnormalities on inspection of external nose and ears. NECK: Normal movements of the head and neck. CARDIOPULMONARY: No increased WOB. Speaking in clear sentences. I:E ratio WNL.  MS: Moves all visible extremities without noticeable abnormality. PSYCH: Pleasant and cooperative, well-groomed. Speech normal rate and rhythm. Affect is appropriate. Insight and judgement are appropriate. Attention is focused, linear, and appropriate.  NEURO: CN grossly intact. Oriented as arrived to appointment on time with no prompting. Moves both UE equally.  SKIN: No obvious lesions, wounds, erythema, or cyanosis noted on face or hands.  Assessment and Plan:   Diagnoses and all orders for this visit:  Advice given about COVID-19 virus infection   Long discussion regarding this.  We reviewed CDC guidelines.  I do feel like right now she is appropriate to do watchful waiting for her son as well as her family.  I recommended that if any symptoms began in any of them, to reach out to [place] so we can appropriately get her family tested.  Patient verbalized understanding to plan.  . Reviewed expectations re:  course of current medical issues. . Discussed self-management of symptoms. . Outlined signs and symptoms indicating need for more acute intervention. . Patient verbalized understanding and all questions were answered. Marland Kitchen Health Maintenance issues including appropriate healthy diet, exercise, and smoking avoidance were discussed with patient. . See orders for this visit as documented in the electronic medical record.  I discussed the assessment and treatment plan with the patient. The patient was provided an opportunity to ask questions and all were answered. The patient agreed with the plan and demonstrated an understanding of the instructions.   The patient was advised to call back or seek an in-person evaluation if the symptoms worsen or if the condition fails to improve as anticipated.   CMA or LPN served as scribe during this visit. History, Physical, and Plan performed by medical provider. The above documentation has been reviewed and is accurate and complete.  [place], [place] 06/10/2019

## 2019-06-11 ENCOUNTER — Encounter: Payer: Self-pay | Admitting: Physician Assistant

## 2019-06-14 ENCOUNTER — Other Ambulatory Visit: Payer: Self-pay | Admitting: Physician Assistant

## 2019-06-14 MED ORDER — OXYCODONE-ACETAMINOPHEN 5-325 MG PO TABS: 1.0000 | ORAL_TABLET | ORAL | 0 refills | Status: DC | PRN

## 2019-06-27 IMAGING — MR MR ABDOMEN WO/W CM
10 of 19 series · 21 of 48 positions shown · IV contrast (agent unspecified)
Comparison: Partial comparison to CTA chest dated 08/14/2018

CLINICAL DATA: Renal mass on CT

EXAM:
MRI ABDOMEN WITHOUT AND WITH CONTRAST
TECHNIQUE: Multiplanar multisequence MR imaging of the abdomen was performed
both before and after the administration of intravenous contrast.
CONTRAST:  10 mL Gadovist IV

[Series 4: DWI b500 · axial · 6.0mm · 1.48mm/px · z∈[+68,+295]mm · 2 of 60 slices shown]
[im 1/60]
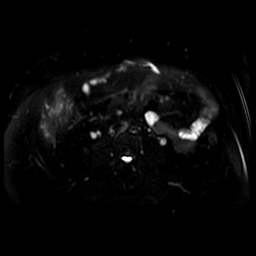
[im 60/60]
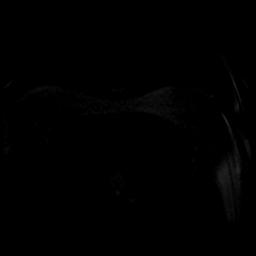

[Series 5: T2 fat-sat · axial · 5.0mm · 0.78mm/px · z∈[+49,+294]mm · 2 of 50 slices shown]
[im 1/50]
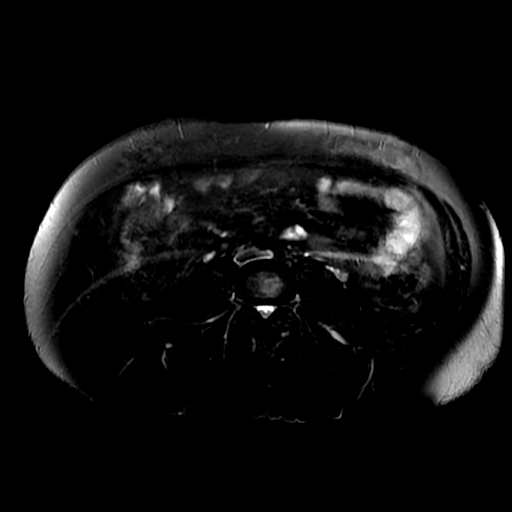
[im 50/50]
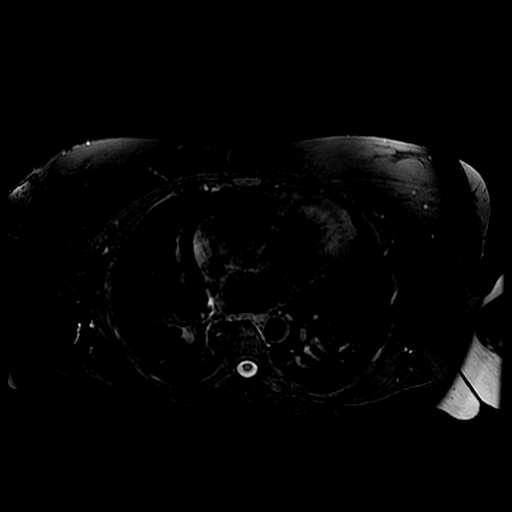

[Series 6: T2 · axial · 5.0mm · 0.78mm/px · z∈[+49,+294]mm · 2 of 50 slices shown (1 of 2)]
[im 1/50]
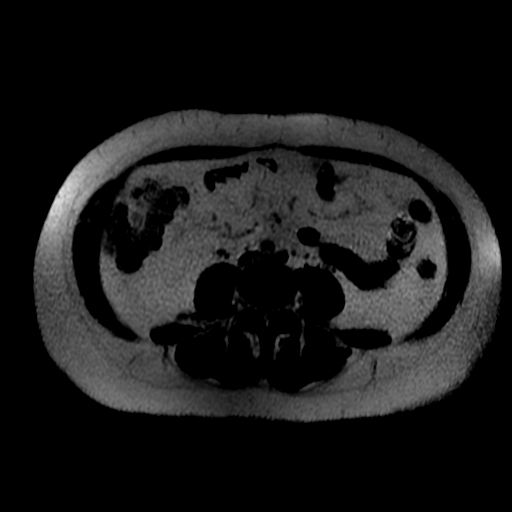
[im 50/50]
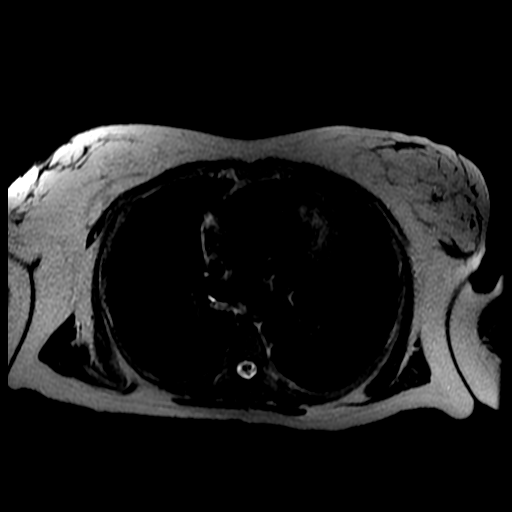

[Series 7: T2 · coronal · 5.0mm · 0.92mm/px · 2 of 45 slices shown (2 of 2)]
[im 1/45]
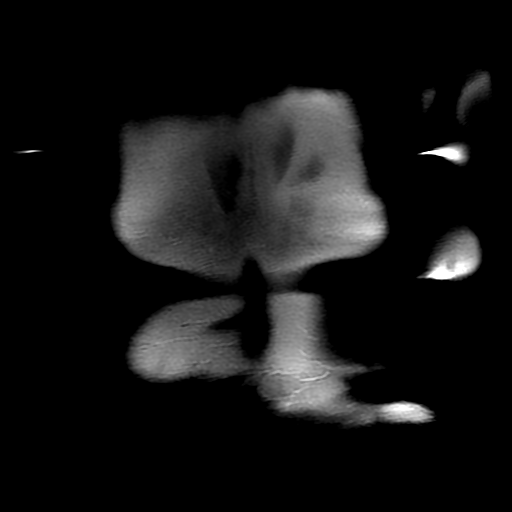
[im 45/45]
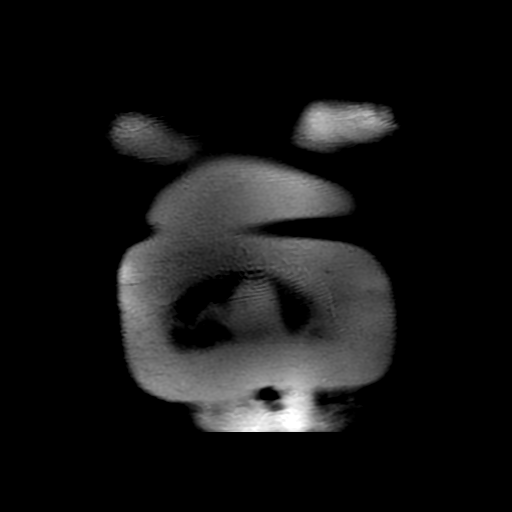

[Series 8: bSSFP · axial · 5.0mm · 0.78mm/px · 1 of 31 slices shown (1 of 2)]
[im 1/31]
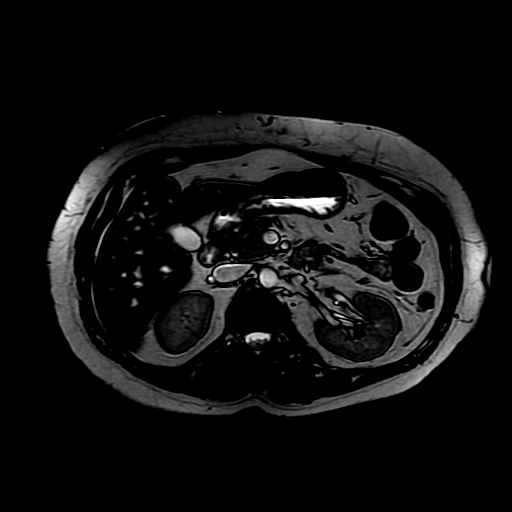

[Series 9: bSSFP · axial · 5.0mm · 0.78mm/px · z∈[+49,+294]mm · 2 of 50 slices shown (2 of 2)]
[im 1/50]
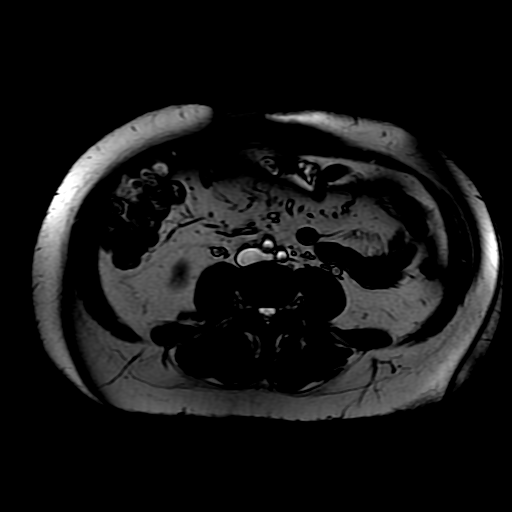
[im 50/50]
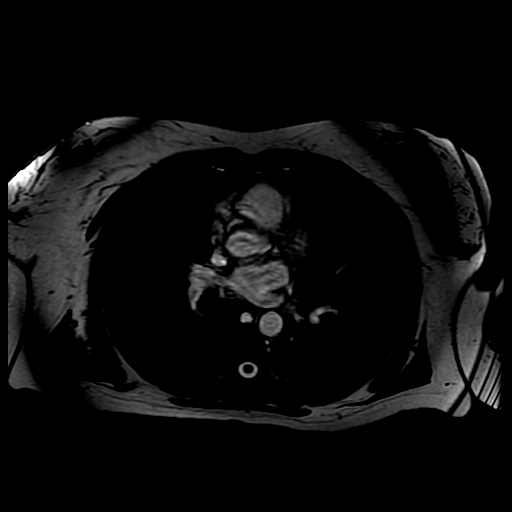

[Series 10: ax dualecho bh · axial · 5.0mm · 0.78mm/px · z∈[+49,+294]mm · 3 of 100 slices shown]
[im 1/100]
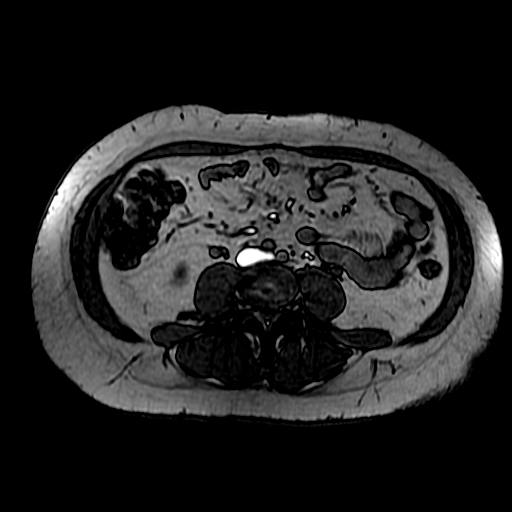
[im 50/100]
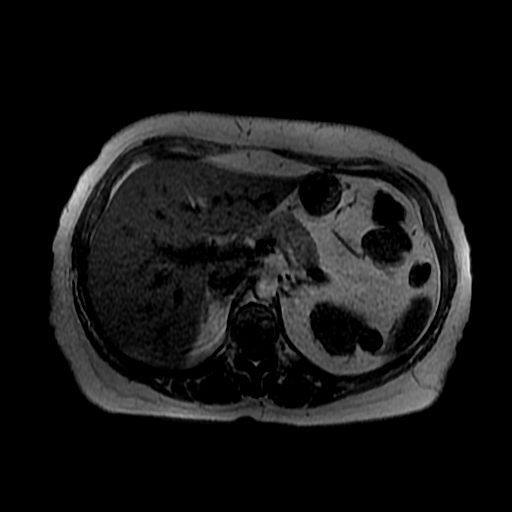
[im 100/100]
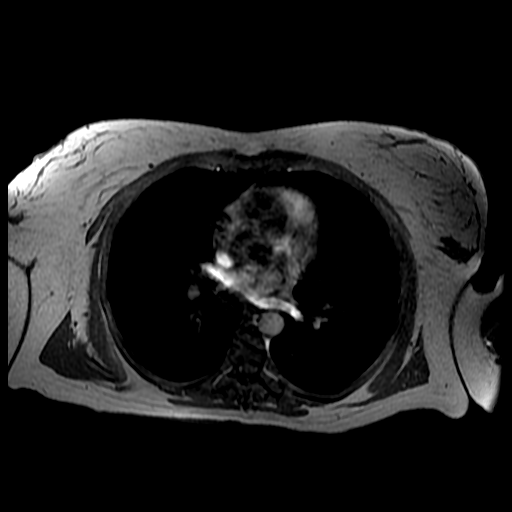

[Series 400: DWI · axial · 6.0mm · 1.48mm/px · 1 of 30 slices shown]
[im 1/30]
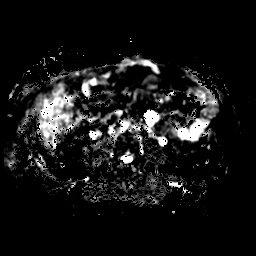

[Series 1100: T1 dynamic · axial · 5.8mm · 0.78mm/px · z∈[+34,+286]mm · 3 of 88 slices shown (1 of 2)]
[im 1/88]
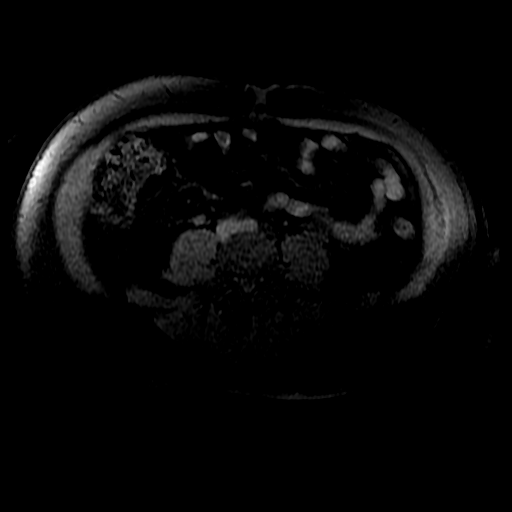
[im 44/88]
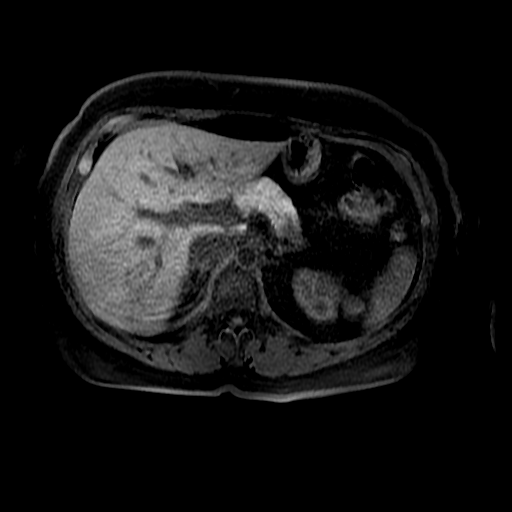
[im 88/88]
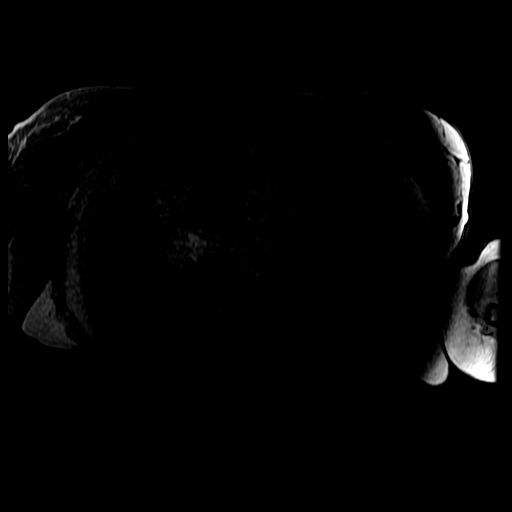

[Series 1101: T1 dynamic · axial · 5.8mm · 0.78mm/px · z∈[+34,+286]mm · 3 of 88 slices shown (2 of 2)]
[im 1/88]
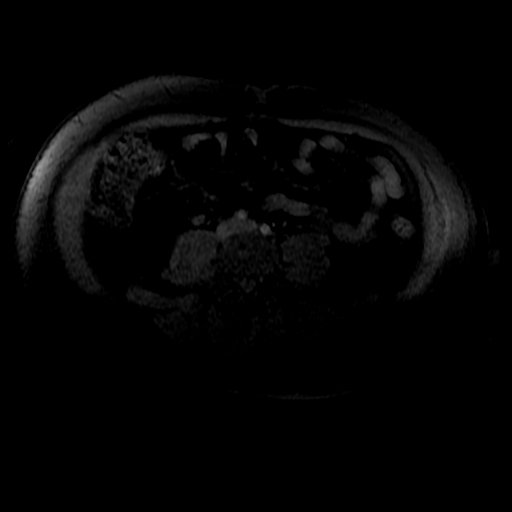
[im 44/88]
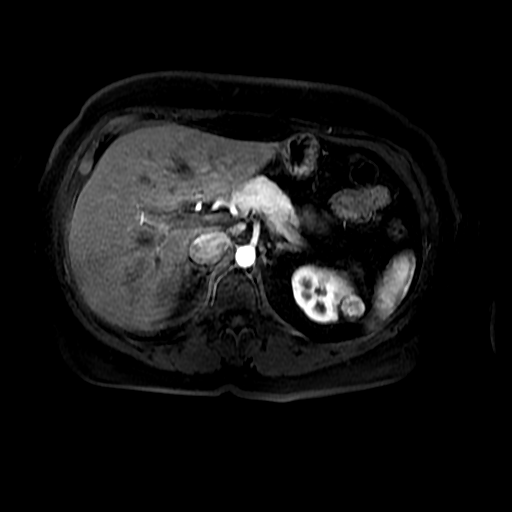
[im 88/88]
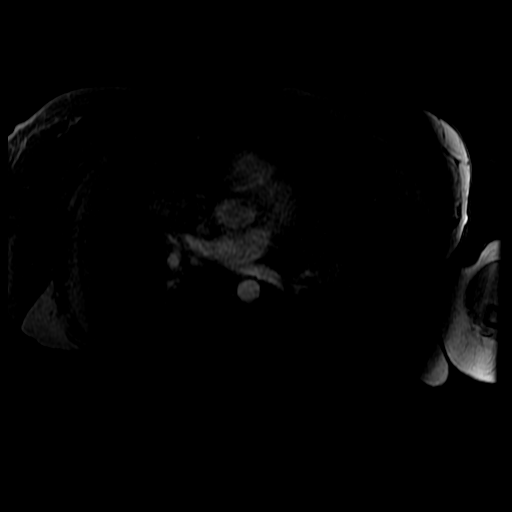

[21 of 48 positions shown; findings below may reference images not displayed]

FINDINGS: Lower chest: Lung bases clear.

Hepatobiliary: Liver is within normal limits.

Gallbladder is unremarkable. No intrahepatic or extrahepatic ductal
dilatation.

Pancreas:  Within normal limits.

Spleen:  Within no limits.

Adrenals/Urinary Tract:  Adrenal glands are within normal limits.

1.9 x 2.0 x 1.7 cm solid enhancing mass in the lateral left upper
kidney (series 8829/image 44), compatible with solid renal neoplasm
such as renal cell carcinoma.

7 mm posterior interpolar right renal cyst (series 5/image 35). No
hydronephrosis.

Stomach/Bowel: Stomach is within normal limits.

Visualized bowel is unremarkable.

Vascular/Lymphatic: No evidence of abdominal aortic aneurysm. Single
left renal artery and vein. No renal vein invasion.

No suspicious abdominal lymphadenopathy.

Other:  No abdominal ascites.

Musculoskeletal: No focal osseous lesions.
IMPRESSION: 2.0 cm solid enhancing mass in the lateral left upper kidney,
compatible with solid renal neoplasm such as renal cell carcinoma.

Single left renal artery and vein. No renal vein invasion. No
evidence of regional lymphadenopathy or metastatic disease.

## 2019-07-15 ENCOUNTER — Other Ambulatory Visit: Payer: Self-pay | Admitting: *Deleted

## 2019-07-15 MED ORDER — ESOMEPRAZOLE MAGNESIUM 40 MG PO CPDR: 40.0000 mg | DELAYED_RELEASE_CAPSULE | Freq: Every day | ORAL | 5 refills | Status: DC | PRN

## 2019-07-28 ENCOUNTER — Other Ambulatory Visit: Payer: Self-pay

## 2019-07-28 DIAGNOSIS — Z20822 Contact with and (suspected) exposure to covid-19: Secondary | ICD-10-CM

## 2019-07-30 IMAGING — DX DG CHEST 2V
3 series · 3 of 3 positions shown · non-contrast
Comparison: Radiograph 08/06/2018

CLINICAL DATA: Oxygen desaturation. Shortness of breath and central
chest pain, recent partial nephrectomy.

EXAM:
CHEST - 2 VIEW

[chest lat (1 of 2)]
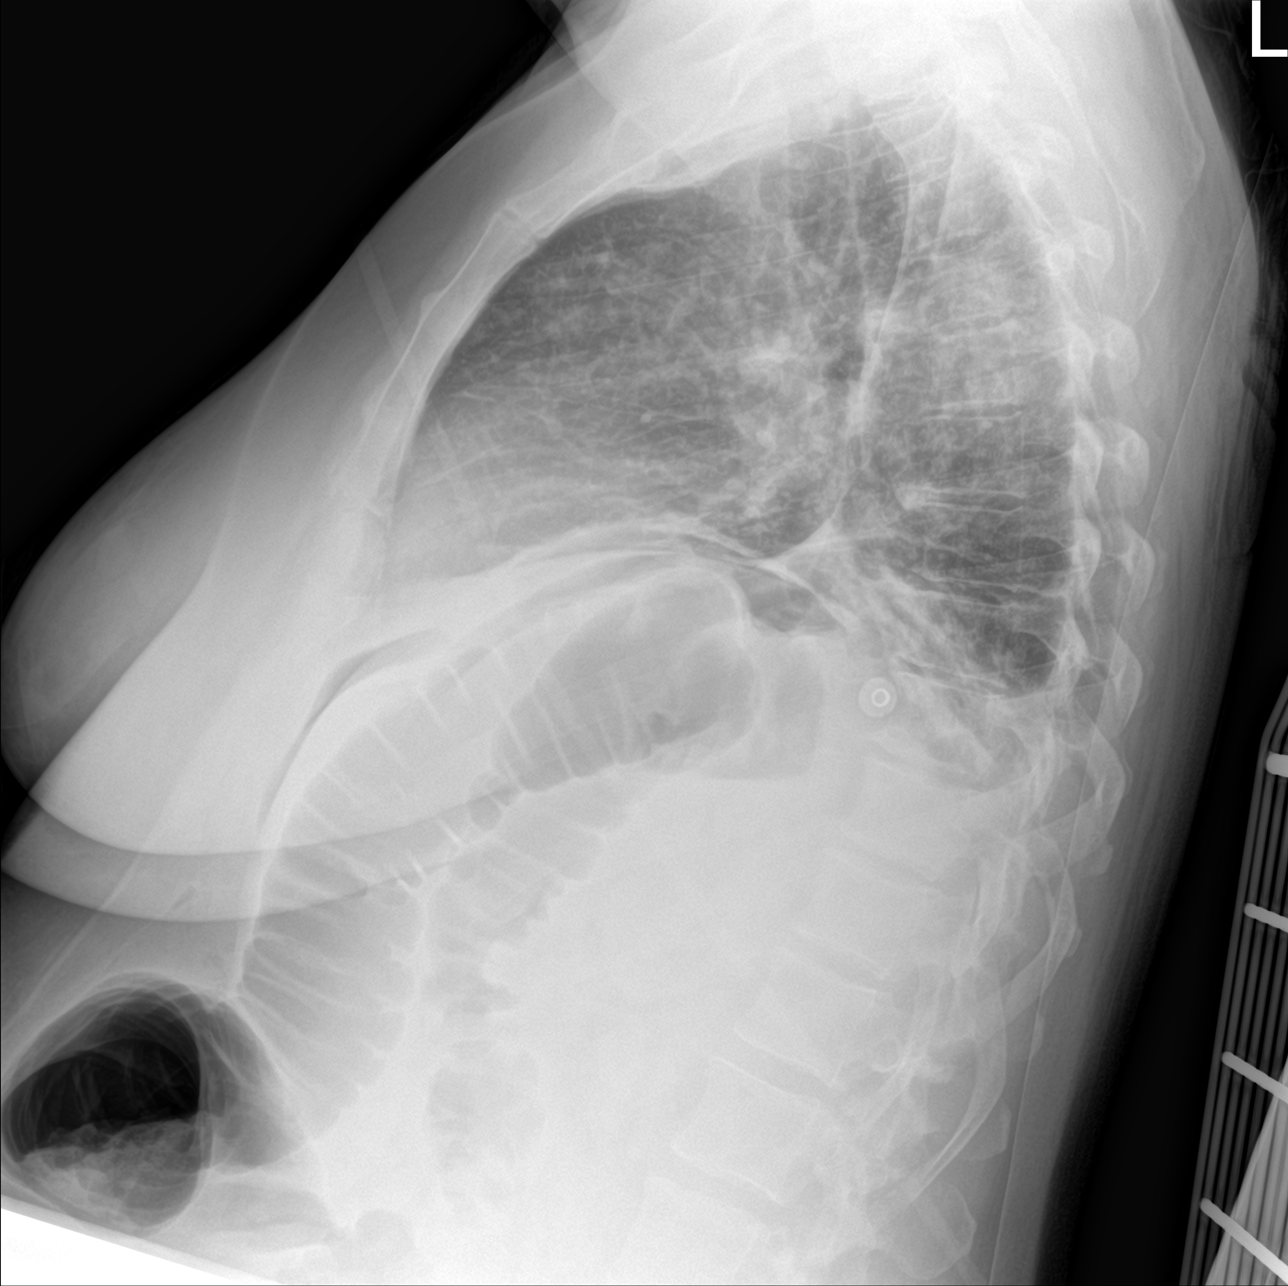

[chest ap]
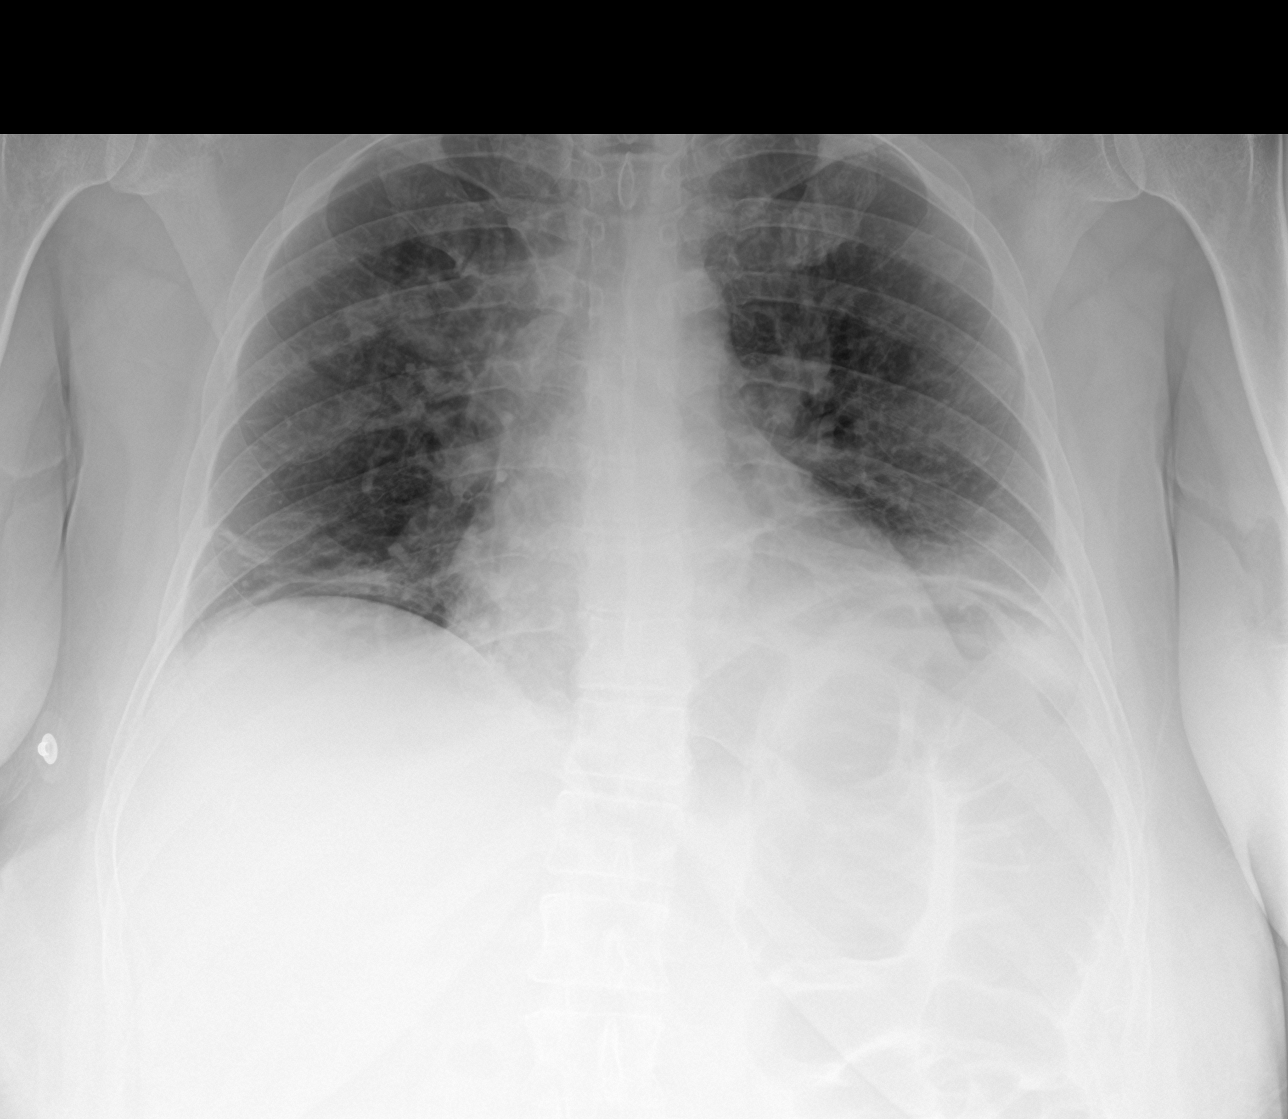

[chest lat (2 of 2)]
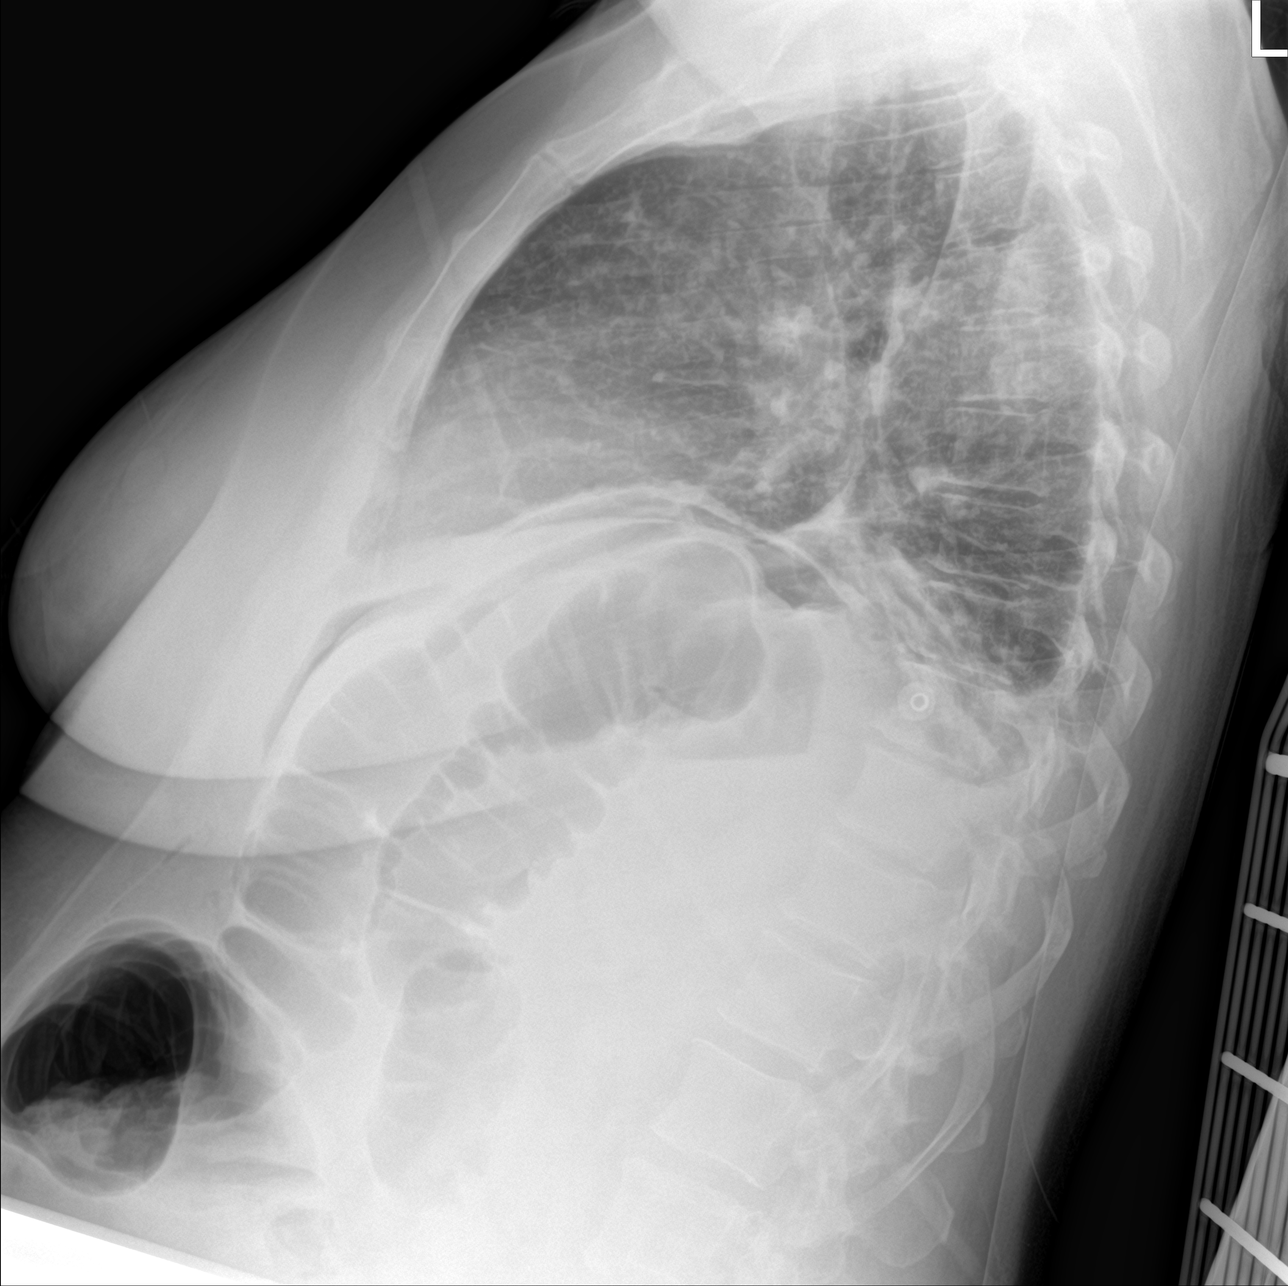

[3 of 3 positions shown; findings below may reference images not displayed]

FINDINGS: Low lung volumes with bronchovascular crowding versus vascular
congestion. Streaky bibasilar atelectasis. Upper normal size likely
accentuated by technique. No large pleural effusion. No
pneumothorax. Free air under the diaphragm is likely related to
recent abdominal surgery.
IMPRESSION: Low lung volumes with bronchovascular crowding versus vascular
congestion. Streaky bibasilar atelectasis.

## 2019-08-01 LAB — NOVEL CORONAVIRUS, NAA: SARS-CoV-2, NAA: NOT DETECTED

## 2019-08-06 ENCOUNTER — Other Ambulatory Visit: Payer: Self-pay

## 2019-08-09 ENCOUNTER — Other Ambulatory Visit: Payer: Self-pay

## 2019-08-09 ENCOUNTER — Other Ambulatory Visit: Payer: Self-pay | Admitting: Physician Assistant

## 2019-08-09 ENCOUNTER — Ambulatory Visit (INDEPENDENT_AMBULATORY_CARE_PROVIDER_SITE_OTHER): Payer: PRIVATE HEALTH INSURANCE | Admitting: Gynecology

## 2019-08-09 ENCOUNTER — Encounter: Payer: Self-pay | Admitting: Gynecology

## 2019-08-09 VITALS — BP 120/80

## 2019-08-09 DIAGNOSIS — N898 Other specified noninflammatory disorders of vagina: Secondary | ICD-10-CM

## 2019-08-09 LAB — WET PREP FOR TRICH, YEAST, CLUE

## 2019-08-09 MED ORDER — FLUCONAZOLE 150 MG PO TABS: 150.0000 mg | ORAL_TABLET | Freq: Once | ORAL | 0 refills | Status: AC

## 2019-08-09 NOTE — Progress Notes (Signed)
    Mackenzie Meyers Severe June 07, 1971 [id number]        48 y.o.  [id number] presents with vaginal odor.  No discharge itching or irritation.  No urinary symptoms such as frequency dysuria urgency low back pain fever or chills.  Past medical history,surgical history, problem list, medications, allergies, family history and social history were all reviewed and documented in the EPIC chart.  Directed ROS with pertinent positives and negatives documented in the history of present illness/assessment and plan.  Exam: [name] assistant Vitals:   08/09/19 1205  BP: 120/80   General appearance:  Normal Abdomen soft nontender without masses guarding rebound Pelvic external BUS vagina with a whitish discharge.  Bimanual without masses or tenderness  Assessment/Plan:  48 y.o. [id number] with history and exam as above.  Wet prep was negative.  Based on her history of yeast infections feeling similar in the past I am going to cover her with Diflucan 150 mg x 1 dose.  Follow-up if symptoms persist, worsen or recur.    [name] MD, 12:16 PM 08/09/2019

## 2019-08-09 NOTE — Patient Instructions (Signed)
Take the Diflucan pill.  Follow-up if your symptoms persist, worsen or recur.

## 2019-10-28 ENCOUNTER — Other Ambulatory Visit: Payer: Self-pay

## 2019-10-29 ENCOUNTER — Encounter: Payer: PRIVATE HEALTH INSURANCE | Admitting: Gynecology

## 2019-10-29 ENCOUNTER — Encounter: Payer: PRIVATE HEALTH INSURANCE | Admitting: Obstetrics and Gynecology

## 2019-11-15 ENCOUNTER — Other Ambulatory Visit: Payer: Self-pay

## 2019-11-15 ENCOUNTER — Ambulatory Visit (INDEPENDENT_AMBULATORY_CARE_PROVIDER_SITE_OTHER): Payer: PRIVATE HEALTH INSURANCE | Admitting: Physician Assistant

## 2019-11-15 ENCOUNTER — Encounter: Payer: Self-pay | Admitting: Physician Assistant

## 2019-11-15 VITALS — BP 110/70 | HR 78 | Temp 98.1°F | Ht 64.5 in | Wt 197.0 lb

## 2019-11-15 DIAGNOSIS — Z13220 Encounter for screening for lipoid disorders: Secondary | ICD-10-CM

## 2019-11-15 DIAGNOSIS — Z136 Encounter for screening for cardiovascular disorders: Secondary | ICD-10-CM

## 2019-11-15 DIAGNOSIS — E538 Deficiency of other specified B group vitamins: Secondary | ICD-10-CM | POA: Diagnosis not present

## 2019-11-15 DIAGNOSIS — C642 Malignant neoplasm of left kidney, except renal pelvis: Secondary | ICD-10-CM

## 2019-11-15 DIAGNOSIS — Z0001 Encounter for general adult medical examination with abnormal findings: Secondary | ICD-10-CM | POA: Diagnosis not present

## 2019-11-15 DIAGNOSIS — F321 Major depressive disorder, single episode, moderate: Secondary | ICD-10-CM | POA: Diagnosis not present

## 2019-11-15 DIAGNOSIS — E559 Vitamin D deficiency, unspecified: Secondary | ICD-10-CM

## 2019-11-15 DIAGNOSIS — Z720 Tobacco use: Secondary | ICD-10-CM | POA: Diagnosis not present

## 2019-11-15 DIAGNOSIS — M546 Pain in thoracic spine: Secondary | ICD-10-CM

## 2019-11-15 DIAGNOSIS — E669 Obesity, unspecified: Secondary | ICD-10-CM

## 2019-11-15 DIAGNOSIS — Z8632 Personal history of gestational diabetes: Secondary | ICD-10-CM | POA: Diagnosis not present

## 2019-11-15 LAB — CBC WITH DIFFERENTIAL/PLATELET
Basophils Absolute: 0.1 10*3/uL (ref 0.0–0.1)
Basophils Relative: 0.8 % (ref 0.0–3.0)
Eosinophils Absolute: 0.2 10*3/uL (ref 0.0–0.7)
Eosinophils Relative: 1.9 % (ref 0.0–5.0)
HCT: 44.6 % (ref 36.0–46.0)
Hemoglobin: 15.1 g/dL — ABNORMAL HIGH (ref 12.0–15.0)
Lymphocytes Relative: 25.9 % (ref 12.0–46.0)
Lymphs Abs: 2.4 10*3/uL (ref 0.7–4.0)
MCHC: 33.9 g/dL (ref 30.0–36.0)
MCV: 86.3 fl (ref 78.0–100.0)
Monocytes Absolute: 0.6 10*3/uL (ref 0.1–1.0)
Monocytes Relative: 6.9 % (ref 3.0–12.0)
Neutro Abs: 5.9 10*3/uL (ref 1.4–7.7)
Neutrophils Relative %: 64.5 % (ref 43.0–77.0)
Platelets: 294 10*3/uL (ref 150.0–400.0)
RBC: 5.17 Mil/uL — ABNORMAL HIGH (ref 3.87–5.11)
RDW: 13.8 % (ref 11.5–15.5)
WBC: 9.2 10*3/uL (ref 4.0–10.5)

## 2019-11-15 LAB — COMPREHENSIVE METABOLIC PANEL
ALT: 17 U/L (ref 0–35)
AST: 15 U/L (ref 0–37)
Albumin: 4.4 g/dL (ref 3.5–5.2)
Alkaline Phosphatase: 80 U/L (ref 39–117)
BUN: 12 mg/dL (ref 6–23)
CO2: 29 mEq/L (ref 19–32)
Calcium: 10.1 mg/dL (ref 8.4–10.5)
Chloride: 104 mEq/L (ref 96–112)
Creatinine, Ser: 0.73 mg/dL (ref 0.40–1.20)
GFR: 85.67 mL/min (ref >60.00)
Glucose, Bld: 95 mg/dL (ref 70–99)
Potassium: 4.6 mEq/L (ref 3.5–5.1)
Sodium: 139 mEq/L (ref 135–145)
Total Bilirubin: 0.5 mg/dL (ref 0.2–1.2)
Total Protein: 7.3 g/dL (ref 6.0–8.3)

## 2019-11-15 LAB — LIPID PANEL
Cholesterol: 171 mg/dL (ref 0–200)
HDL: 45.4 mg/dL (ref >39.00)
LDL Cholesterol: 108 mg/dL — ABNORMAL HIGH (ref 0–99)
NonHDL: 125.8
Total CHOL/HDL Ratio: 4
Triglycerides: 89 mg/dL (ref 0.0–149.0)
VLDL: 17.8 mg/dL (ref 0.0–40.0)

## 2019-11-15 LAB — VITAMIN D 25 HYDROXY (VIT D DEFICIENCY, FRACTURES): VITD: 37.58 ng/mL (ref 30.00–100.00)

## 2019-11-15 LAB — TSH: TSH: 1.54 u[IU]/mL (ref 0.35–4.50)

## 2019-11-15 LAB — VITAMIN B12: Vitamin B-12: 441 pg/mL (ref 211–911)

## 2019-11-15 LAB — HEMOGLOBIN A1C: Hgb A1c MFr Bld: 5.5 % (ref 4.6–6.5)

## 2019-11-15 MED ORDER — BUPROPION HCL ER (XL) 150 MG PO TB24: 150.0000 mg | ORAL_TABLET | Freq: Every day | ORAL | 0 refills | Status: DC

## 2019-11-15 MED ORDER — DIAZEPAM 5 MG PO TABS: 5.0000 mg | ORAL_TABLET | Freq: Two times a day (BID) | ORAL | 1 refills | Status: DC | PRN

## 2019-11-15 NOTE — Progress Notes (Signed)
I acted as a Education administrator for [employer], PA-C [employer], LPN   Subjective:    Mackenzie Meyers is a 49 y.o. female and is here for a comprehensive physical exam.   HPI  There are no preventive care reminders to display for this patient.  Acute Concerns: Depression Pt c/o depression for the past 3 months. Having crying spells for no reason and does not feeling like doing things most days and puts them off. Irritability. She has tried Wellbutrin in the past, it has been several years, but she cannot recall if it causing worsening symptoms. She also endorses anxiety. She takes care of her 12 year old daughter during the [place] and doesn't have much time for herself. Back pain Gets intermittent back pain that requires flexeril. Usually when she over exerts herself with cleaning or other activity. She has to take 2 flexeril at a time and it isn't always effective.  Chronic Issues: Renal cell cancer -- Currently in remission, but now, once a year has to get a chest xray and MRI of abdomen. Every 6 months needs BMP. Just saw Dr. Sherryll Burger on 12/17 for this.  Tobacco abuse -- smoking about 1 PPD. B12 and D deficiency -- would like her labs updated History of gestational diabetes -- last HgbA1c was 5.5% a year ago  Health Maintenance: Immunizations -- UTD Colonoscopy -- N/A Mammogram -- UTD, due 04/2020 PAP -- UTD, due 2023 Bone Density -- N/A Diet -- tries to eat a wide variety of foods Sleep habits -- variable due to mood Exercise -- limited now, improving with weather Weight -- Weight: 197 lb (89.4 kg)  Mood -- depression -- see above Weight history: Wt Readings from Last 10 Encounters:  11/15/19 197 lb (89.4 kg)  11/20/18 197 lb (89.4 kg)  10/28/18 196 lb (88.9 kg)  10/02/18 190 lb 8 oz (86.4 kg)  09/17/18 190 lb (86.2 kg)  09/10/18 190 lb (86.2 kg)  08/31/18 190 lb 4 oz (86.3 kg)  08/17/18 187 lb (84.8 kg)  08/14/18 187 lb (84.8 kg)  08/11/18 188 lb 6.1 oz (85.4 kg)   No LMP  recorded. (Menstrual status: IUD). Alcohol use: rare, twice a year Tobacco use: 1 PPD  Depression screen PHQ 2/9 11/15/2019  Decreased Interest 2  Down, Depressed, Hopeless 1  PHQ - 2 Score 3  Altered sleeping 1  Tired, decreased energy 2  Change in appetite 0  Feeling bad or failure about yourself  1  Trouble concentrating 0  Moving slowly or fidgety/restless 1  Suicidal thoughts 0  PHQ-9 Score 8  Difficult doing work/chores Somewhat difficult     Other providers/specialists: Patient Care Team: Inda Coke, [place] as PCP - General (Physician Assistant) Alyson Ingles Candee Furbish, MD as Consulting Physician (Urology)    PMHx, SurgHx, SocialHx, Medications, and Allergies were reviewed in the Visit Navigator and updated as appropriate.   Past Medical History:  Diagnosis Date  . Anxiety    currently managed on CBD oil, was on medication briefly but made her OCD worse  . Arthritis    hands, DDD, and back 12-2017  . Breast mass, right 09/2012   removed because it was painful, was found to be calcified scar tissue  . Cancer ([place])    left renal mass  . Cervical dysplasia age 27  . Complication of anesthesia    Cries coming out of anesthesia   . Family history of adverse reaction to anesthesia    Maternal grandmother PONV  .  Geographical tongue   . Gestational diabetes    gestational  . 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 History of kidney stones 10/2011  . 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 Hx of migraines   . OCD (obsessive compulsive disorder)    cleanliness with home and workspace  . Rosacea   . Sinus congestion 10/12/2012  . Sleep apnea 04/2017   patient is wearing C-pap  . Tobacco abuse 1989   max was 1.5 packs per [place]  . Vitamin D deficiency 10/2013   Value 29  . Vitiligo      Past Surgical History:  Procedure Laterality Date  . ABDOMINOPLASTY  2008  . BREAST BIOPSY Right 10/15/2012    Procedure: BREAST BIOPSY;  Surgeon: Haywood Lasso, MD;  Location: [facility];  Service: General;  Laterality: Right;  removal right breast mass  . BREAST EXCISIONAL BIOPSY Right   . BREAST SURGERY     Lift  . CESAREAN SECTION  2004, 2007  . CESAREAN SECTION N/A 02/07/2015   Procedure: CESAREAN SECTION;  Surgeon: Jerelyn Charles, MD;  Location: [facility];  Service: Obstetrics;  Laterality: N/A;  EDD: 02/13/15   . dialation and cutterage    . GYNECOLOGIC CRYOSURGERY  age 87  . HERNIA REPAIR  03/03/2019  . INTRAUTERINE DEVICE INSERTION  03/2015   Mirena at [place]  . MICRODISCECTOMY LUMBAR     12-2017   . REDUCTION MAMMAPLASTY Bilateral   . ROBOTIC ASSITED PARTIAL NEPHRECTOMY Left 09/17/2018   Procedure: XI ROBOTIC ASSITED PARTIAL NEPHRECTOMY;  Surgeon: Cleon Gustin, MD;  Location: [facility];  Service: Urology;  Laterality: Left;  . TONSILLECTOMY  2000     Family History  Problem Relation Age of Onset  . Arthritis Mother   . High Cholesterol Mother   . High blood pressure Mother   . Hearing loss Father   . High Cholesterol Father   . High blood pressure Father   . Cancer Maternal Grandfather        lymphoma  . Cancer Paternal Grandfather        liver  . Alcohol abuse Paternal Grandfather   . Lung cancer Maternal Grandmother        and maternal uncle; small cell   . Arthritis Maternal Grandmother   . Hyperlipidemia Maternal Grandmother   . Hypertension Maternal Grandmother   . Hearing loss Maternal Grandmother   . Cancer Maternal Uncle        Lung cancer  . Hyperlipidemia Other   . Asthma Brother   . Alcohol abuse Paternal Grandmother   . Hypertension Paternal Grandmother   . Eczema Son   . Colon cancer Neg Hx   . Breast cancer Neg Hx   . Allergic rhinitis Neg Hx   . Angioedema Neg Hx   . Urticaria Neg Hx     Social History   Tobacco Use  . Smoking status: Current Every [place] Smoker    Packs/[place]: 1.00    Types: Cigarettes    Start date:  10/25/2018  . Smokeless tobacco: Never Used  . Tobacco comment: restarted again March 2020  Substance Use Topics  . Alcohol use: Not Currently    Comment: rare, twice a year  . Drug use: No    Review of Systems:   Review of Systems  Constitutional: Negative for chills, fever, malaise/fatigue and weight loss.  HENT: Negative for hearing  loss, sinus pain and sore throat.   Respiratory: Negative for cough and hemoptysis.   Cardiovascular: Negative for chest pain, palpitations, leg swelling and PND.  Gastrointestinal: Negative for abdominal pain, constipation, diarrhea, heartburn, nausea and vomiting.  Genitourinary: Negative for dysuria, frequency and urgency.  Musculoskeletal: Negative for back pain, myalgias and neck pain.  Skin: Negative for itching and rash.  Neurological: Negative for dizziness, tingling, seizures and headaches.  Endo/Heme/Allergies: Negative for polydipsia.  Psychiatric/Behavioral: Negative for depression. The patient is not nervous/anxious.     Objective:   BP 110/70 (BP Location: Left Arm, Patient Position: Sitting, Cuff Size: Large)   Pulse 78   Temp 98.1 F (36.7 C) (Temporal)   Ht 5' 4.5" (1.638 m)   Wt 197 lb (89.4 kg)   SpO2 97%   BMI 33.29 kg/m  Body mass index is 33.29 kg/m.   General Appearance:    Alert, cooperative, no distress, appears stated age  Head:    Normocephalic, without obvious abnormality, atraumatic  Eyes:    PERRL, conjunctiva/corneas clear, EOM's intact, fundi    benign, both eyes  Ears:    Normal TM's and external ear canals, both ears  Nose:   Nares normal, septum midline, mucosa normal, no drainage    or sinus tenderness  Throat:   Lips, mucosa, and tongue normal; teeth and gums normal  Neck:   Supple, symmetrical, trachea midline, no adenopathy;    thyroid:  no enlargement/tenderness/nodules; no carotid   bruit or JVD  Back:     Symmetric, no curvature, ROM normal, no CVA tenderness  Lungs:     Clear to auscultation  bilaterally, respirations unlabored  Chest Wall:    No tenderness or deformity   Heart:    Regular rate and rhythm, S1 and S2 normal, no murmur, rub or gallop  Breast Exam:    Deferred  Abdomen:     Soft, non-tender, bowel sounds active all four quadrants,    no masses, no organomegaly  Genitalia:    Deferred  Extremities:   Deferred  Pulses:   2+ and symmetric all extremities  Skin:   Skin color, texture, turgor normal, no rashes or lesions  Lymph nodes:   Cervical, supraclavicular, and axillary nodes normal  Neurologic:   CNII-XII intact, normal strength, sensation and reflexes    throughout     Assessment/Plan:   Ardyn was seen today for annual exam.  Diagnoses and all orders for this visit:  Encounter for general adult medical examination with abnormal findings Today patient counseled on age appropriate routine health concerns for screening and prevention, each reviewed and up to date or declined. Immunizations reviewed and up to date or declined. Labs ordered and reviewed. Risk factors for depression reviewed and negative. Hearing function and visual acuity are intact. ADLs screened and addressed as needed. Functional ability and level of safety reviewed and appropriate. Education, counseling and referrals performed based on assessed risks today. Patient provided with a copy of personalized plan for preventive services.  Tobacco abuse Encouraged cessation; we are starting wellbutrin today.  Renal cell carcinoma Per urology. Will update BMP yearly per patient request.  Depression, major, single episode, moderate ([place]) Denies SI/HI. Start wellbutrin today. Follow-up in 4-6 weeks, sooner if concerns. Side effects of medication discussed and recommended that she talk with her husband about starting this medication in case it causes mood change. -     CBC with Differential/Platelet -     Comprehensive metabolic panel -  TSH -     Iron, TIBC and Ferritin Panel  Encounter for  lipid screening for cardiovascular disease -     Lipid panel  Vitamin D deficiency; B12 deficiency Update labs and address appropriately. -     VITAMIN D 25 Hydroxy (Vit-D Deficiency, Fractures) -     B12  History of gestational diabetes Update HgbA1c. -     Hemoglobin A1c  Acute bilateral thoracic back pain Intermittent. Flexeril is not effective for her. Will trial Valium, patient denies history or concerns for drug addiction/abuse. Will start this medication and have her follow-up as needed.  Obesity Plans to get outside and exercise as the weather improves.  Other orders -     buPROPion (WELLBUTRIN XL) 150 MG 24 hr tablet; Take 1 tablet (150 mg total) by mouth daily. -     diazepam (VALIUM) 5 MG tablet; Take 1 tablet (5 mg total) by mouth every 12 (twelve) hours as needed for muscle spasms.     Well Adult Exam: Labs ordered: Yes. Patient counseling was done. See below for items discussed. Discussed the patient's BMI.  The BMI is not in the acceptable range; BMI management plan is completed Follow up in 1 month. Breast cancer screening: UTD. Cervical cancer screening: UTD   Patient Counseling: [x]    Nutrition: Stressed importance of moderation in sodium/caffeine intake, saturated fat and cholesterol, caloric balance, sufficient intake of fresh fruits, vegetables, fiber, calcium, iron, and 1 mg of folate supplement per [place] (for females capable of pregnancy).  [x]    Stressed the importance of regular exercise.   [x]    Substance Abuse: Discussed cessation/primary prevention of tobacco, alcohol, or other drug use; driving or other dangerous activities under the influence; availability of treatment for abuse.   [x]    Injury prevention: Discussed safety belts, safety helmets, smoke detector, smoking near bedding or upholstery.   [x]    Sexuality: Discussed sexually transmitted diseases, partner selection, use of condoms, avoidance of unintended pregnancy  and contraceptive alternatives.    [x]    Dental health: Discussed importance of regular tooth brushing, flossing, and dental visits.  [x]    Health maintenance and immunizations reviewed. Please refer to Health maintenance section.   CMA or LPN served as scribe during this visit. History, Physical, and Plan performed by medical provider. The above documentation has been reviewed and is accurate and complete.   Inda Coke, PA-C [place]

## 2019-11-15 NOTE — Patient Instructions (Signed)
It was great to see you!  Please go to the lab for blood work.   Our office will call you with your results unless you have chosen to receive results via MyChart.  If your blood work is normal we will follow-up each year for physicals and as scheduled for chronic medical problems.  If anything is abnormal we will treat accordingly and get you in for a follow-up.  Take care,  Kinsey Karch    Health Maintenance, Female Adopting a healthy lifestyle and getting preventive care are important in promoting health and wellness. Ask your health care provider about:  The right schedule for you to have regular tests and exams.  Things you can do on your own to prevent diseases and keep yourself healthy. What should I know about diet, weight, and exercise? Eat a healthy diet   Eat a diet that includes plenty of vegetables, fruits, low-fat dairy products, and lean protein.  Do not eat a lot of foods that are high in solid fats, added sugars, or sodium. Maintain a healthy weight Body mass index (BMI) is used to identify weight problems. It estimates body fat based on height and weight. Your health care provider can help determine your BMI and help you achieve or maintain a healthy weight. Get regular exercise Get regular exercise. This is one of the most important things you can do for your health. Most adults should:  Exercise for at least 150 minutes each week. The exercise should increase your heart rate and make you sweat (moderate-intensity exercise).  Do strengthening exercises at least twice a week. This is in addition to the moderate-intensity exercise.  Spend less time sitting. Even light physical activity can be beneficial. Watch cholesterol and blood lipids Have your blood tested for lipids and cholesterol at 47 years of age, then have this test every 5 years. Have your cholesterol levels checked more often if:  Your lipid or cholesterol levels are high.  You are older than 47  years of age.  You are at high risk for heart disease. What should I know about cancer screening? Depending on your health history and family history, you may need to have cancer screening at various ages. This may include screening for:  Breast cancer.  Cervical cancer.  Colorectal cancer.  Skin cancer.  Lung cancer. What should I know about heart disease, diabetes, and high blood pressure? Blood pressure and heart disease  High blood pressure causes heart disease and increases the risk of stroke. This is more likely to develop in people who have high blood pressure readings, are of African descent, or are overweight.  Have your blood pressure checked: ? Every 3-5 years if you are 18-39 years of age. ? Every year if you are 40 years old or older. Diabetes Have regular diabetes screenings. This checks your fasting blood sugar level. Have the screening done:  Once every three years after age 40 if you are at a normal weight and have a low risk for diabetes.  More often and at a younger age if you are overweight or have a high risk for diabetes. What should I know about preventing infection? Hepatitis B If you have a higher risk for hepatitis B, you should be screened for this virus. Talk with your health care provider to find out if you are at risk for hepatitis B infection. Hepatitis C Testing is recommended for:  Everyone born from 1945 through 1965.  Anyone with known risk factors for hepatitis C. Sexually   transmitted infections (STIs)  Get screened for STIs, including gonorrhea and chlamydia, if: ? You are sexually active and are younger than 47 years of age. ? You are older than 47 years of age and your health care provider tells you that you are at risk for this type of infection. ? Your sexual activity has changed since you were last screened, and you are at increased risk for chlamydia or gonorrhea. Ask your health care provider if you are at risk.  Ask your health  care provider about whether you are at high risk for HIV. Your health care provider may recommend a prescription medicine to help prevent HIV infection. If you choose to take medicine to prevent HIV, you should first get tested for HIV. You should then be tested every 3 months for as long as you are taking the medicine. Pregnancy  If you are about to stop having your period (premenopausal) and you may become pregnant, seek counseling before you get pregnant.  Take 400 to 800 micrograms (mcg) of folic acid every [place]lin if you become pregnant.  Ask for birth control (contraception) if you want to prevent pregnancy. Osteoporosis and menopause Osteoporosis is a disease in which the bones lose minerals and strength with aging. This can result in bone fractures. If you are 65 years old or older, or if you are at risk for osteoporosis and fractures, ask your health care provider if you should:  Be screened for bone loss.  Take a calcium or vitamin D supplement to lower your risk of fractures.  Be given hormone replacement therapy (HRT) to treat symptoms of menopause. Follow these instructions at home: Lifestyle  Do not use any products that contain nicotine or tobacco, such as cigarettes, e-cigarettes, and chewing tobacco. If you need help quitting, ask your health care provider.  Do not use street drugs.  Do not share needles.  Ask your health care provider for help if you need support or information about quitting drugs. Alcohol use  Do not drink alcohol if: ? Your health care provider tells you not to drink. ? You are pregnant, may be pregnant, or are planning to become pregnant.  If you drink alcohol: ? Limit how much you use to 0-1 drink a Jolliff. ? Limit intake if you are breastfeeding.  Be aware of how much alcohol is in your drink. In the [place] one drink equals one 12 oz bottle of beer (355 mL), one 5 oz glass of wine (148 mL), or one 1 oz glass of hard liquor (44 mL). General  instructions  Schedule regular health, dental, and eye exams.  Stay current with your vaccines.  Tell your health care provider if: ? You often feel depressed. ? You have ever been abused or do not feel safe at home. Summary  Adopting a healthy lifestyle and getting preventive care are important in promoting health and wellness.  Follow your health care provider's instructions about healthy diet, exercising, and getting tested or screened for diseases.  Follow your health care provider's instructions on monitoring your cholesterol and blood pressure. This information is not intended to replace advice given to you by your health care provider. Make sure you discuss any questions you have with your health care provider. Document Revised: 08/05/2018 Document Reviewed: 08/05/2018 Elsevier Patient Education  2020 Elsevier Inc.  

## 2019-11-16 LAB — IRON,TIBC AND FERRITIN PANEL
%SAT: 21 % (calc) (ref 16–45)
Ferritin: 102 ng/mL (ref 16–232)
Iron: 73 ug/dL (ref 40–190)
TIBC: 351 mcg/dL (calc) (ref 250–450)

## 2019-12-01 ENCOUNTER — Other Ambulatory Visit: Payer: Self-pay

## 2019-12-02 ENCOUNTER — Ambulatory Visit (INDEPENDENT_AMBULATORY_CARE_PROVIDER_SITE_OTHER): Payer: PRIVATE HEALTH INSURANCE | Admitting: Obstetrics and Gynecology

## 2019-12-02 ENCOUNTER — Encounter: Payer: Self-pay | Admitting: Obstetrics and Gynecology

## 2019-12-02 VITALS — BP 120/76 | Ht 65.0 in | Wt 196.0 lb

## 2019-12-02 DIAGNOSIS — Z30431 Encounter for routine checking of intrauterine contraceptive device: Secondary | ICD-10-CM

## 2019-12-02 DIAGNOSIS — Z01419 Encounter for gynecological examination (general) (routine) without abnormal findings: Secondary | ICD-10-CM

## 2019-12-02 NOTE — Patient Instructions (Signed)
Please make an appointment to return in 4 months to change the IUD

## 2019-12-02 NOTE — Progress Notes (Signed)
Mackenzie Meyers 1971/08/18 [id number]  SUBJECTIVE:  49 y.o. [id number] female for annual routine gynecologic exam and Pap smear. She has no gynecologic concerns.  Separately, she had been dealing with having undergone a nephrectomy for renal cell carcinoma and complications with hernia afterwards.  Current Outpatient Medications  Medication Sig Dispense Refill  . buPROPion (WELLBUTRIN XL) 150 MG 24 hr tablet Take 1 tablet (150 mg total) by mouth daily. 90 tablet 0  . ibuprofen (ADVIL,MOTRIN) 200 MG tablet Take 800 mg by mouth as needed.    Marland Kitchen levonorgestrel (MIRENA) 20 MCG/24HR IUD 1 each by Intrauterine route once. Inserted by GYN on 03/2015 needs to be removed 03/2020.    Marland Kitchen OVER THE COUNTER MEDICATION Multi vit patch    . diazepam (VALIUM) 5 MG tablet Take 1 tablet (5 mg total) by mouth every 12 (twelve) hours as needed for muscle spasms. (Patient not taking: Reported on 12/02/2019) 30 tablet 1   No current facility-administered medications for this visit.   Allergies: Ampicillin, Dust mite extract, Erythromycin, Mold extract [trichophyton], Other, Wine [alcohol], and Ciprofloxacin  No LMP recorded. (Menstrual status: IUD).  Past medical history,surgical history, problem list, medications, allergies, family history and social history were all reviewed and documented as reviewed in the EPIC chart.  ROS:  Feeling well. No dyspnea or chest pain on exertion.  No abdominal pain, change in bowel habits, black or bloody stools.  No urinary tract symptoms. GYN ROS:  no abnormal bleeding, pelvic pain or discharge, no breast pain or new or enlarging lumps on self exam. No neurological complaints.    OBJECTIVE:  BP 120/76   Ht 5\' 5"  (1.651 m)   Wt 196 lb (88.9 kg)   BMI 32.62 kg/m  The patient appears well, alert, oriented x 3, in no distress. ENT normal.  Neck supple. No cervical or supraclavicular adenopathy or thyromegaly.  Lungs are clear, good air entry, no wheezes, rhonchi or rales. S1 and S2  normal, no murmurs, regular rate and rhythm.  Abdomen soft without tenderness, guarding, mass or organomegaly.  Neurological is normal, no focal findings.  BREAST EXAM: breasts appear normal, no suspicious masses, no skin or nipple changes or axillary nodes  PELVIC EXAM: VULVA: normal appearing vulva with no masses, tenderness or lesions, VAGINA: normal appearing vagina with normal color and discharge, no lesions, CERVIX: normal appearing cervix without discharge or lesions, IUD string visualized, UTERUS: uterus is normal size, shape, consistency and nontender, ADNEXA: normal adnexa in size, nontender and no masses  Chaperone: [name] present during the examination  ASSESSMENT:  49 y.o. [id number] here for annual gynecologic exam  PLAN:   1. No menstrual or hormonal concerns.  2. Pap smear 10/2018 normal.  Remote history of cryosurgery in teen years, had a positive high-risk HPV with normal cytology in 2016.  Current screening guidelines calling for the 3-year Pap smear cytology only interval, her next Pap smear is due in 2023. 3. Contraception.  Using Mirena IUD with good results, which will be due for removal 03/2019 5:01 years of use.  She will make an appointment to have this done as she would like to have another Mirena IUD. 4. History of recurrent vaginal yeast infections.  No symptoms today.  No discharge on exam. 5. History of herpes labialis.  She does use Valtrex 100 mg daily for suppression. 6.  Mammogram 04/2019.  Normal breast exam.  Encouraged breast self awareness and notify us of any concerning changes.  Continue annual mammograms. 7.  Health maintenance.  Routine screening blood work recently performed elsewhere.   Joseph Pierini MD, FACOG  12/02/19

## 2019-12-24 ENCOUNTER — Other Ambulatory Visit: Payer: Self-pay

## 2019-12-24 ENCOUNTER — Encounter: Payer: Self-pay | Admitting: Physician Assistant

## 2019-12-24 ENCOUNTER — Ambulatory Visit (INDEPENDENT_AMBULATORY_CARE_PROVIDER_SITE_OTHER): Payer: PRIVATE HEALTH INSURANCE | Admitting: Physician Assistant

## 2019-12-24 VITALS — BP 120/70 | HR 86 | Temp 98.3°F | Ht 65.0 in | Wt 196.8 lb

## 2019-12-24 DIAGNOSIS — J309 Allergic rhinitis, unspecified: Secondary | ICD-10-CM

## 2019-12-24 DIAGNOSIS — M546 Pain in thoracic spine: Secondary | ICD-10-CM | POA: Diagnosis not present

## 2019-12-24 DIAGNOSIS — F321 Major depressive disorder, single episode, moderate: Secondary | ICD-10-CM | POA: Diagnosis not present

## 2019-12-24 MED ORDER — CETIRIZINE HCL 10 MG PO TABS: 10.0000 mg | ORAL_TABLET | Freq: Every day | ORAL | 1 refills | Status: DC

## 2019-12-24 MED ORDER — RIZATRIPTAN BENZOATE 10 MG PO TABS
10.0000 mg | ORAL_TABLET | ORAL | 3 refills | Status: DC | PRN
Start: 2019-12-24 — End: 2024-05-10

## 2019-12-24 MED ORDER — CYCLOBENZAPRINE HCL 10 MG PO TABS: 10.0000 mg | ORAL_TABLET | Freq: Every day | ORAL | 0 refills | Status: DC

## 2019-12-24 NOTE — Progress Notes (Signed)
Mackenzie Meyers is a 49 y.o. female is here to discuss:  I acted as a Education administrator for [employer], PA-C Anselmo Pickler, LPN   History of Present Illness:   Chief Complaint  Patient presents with  . Follow-up    Medication, want to increase medicaton     HPI  Anxiety: Depression Currently on Wellbutrin 150 mg and feels "less hopeless". Sleeping okay. Would like to increase to Wellbutrin 300 mg daily.  Feels as though this medication could possibly be helping her with her smoking.  Denies suicidal or homicidal ideation.  She is possibly going to have her daughter start a summer program which will give patient more time to herself.  Back pain Took a 5mg  valium and did not have any relief.  She is wondering if she can increase this to 10 mg if she has another episode.  Denies any changes to her back pain or increase in frequency.  Denies stool or urinary incontinence.    Allergic rhinitis Patient reports that she thinks she is having some headaches that are associated with her allergies.  She occasionally wakes up with a runny nose.  Denies fever, chills, ear pain, sore throat.   Health Maintenance Due  Topic Date Due  . COVID-19 Vaccine (1) Never done    Past Medical History:  Diagnosis Date  . Anxiety    currently managed on CBD oil, was on medication briefly but made her OCD worse  . Arthritis    hands, DDD, and back 12-2017  . Breast mass, right 09/2012   removed because it was painful, was found to be calcified scar tissue  . Cancer ([place])    left renal mass  . Cervical dysplasia age 89  . Complication of anesthesia    Cries coming out of anesthesia   . Family history of adverse reaction to anesthesia    Maternal grandmother PONV  . Geographical tongue   . Gestational diabetes    gestational  . 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 History of kidney stones 10/2011  . 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 Hx of migraines   . OCD (obsessive compulsive disorder)    cleanliness with home and workspace  . Rosacea   . Sinus congestion 10/12/2012  . Sleep apnea 04/2017   patient is wearing C-pap  . Tobacco abuse 1989   max was 1.5 packs per [place]  . Vitamin D deficiency 10/2013   Value 29  . Vitiligo      Social History   Socioeconomic History  . Marital status: Married    Spouse name: Not on file  . Number of children: 3  . Years of education: Not on file  . Highest education level: Not on file  Occupational History  . Not on file  Tobacco Use  . Smoking status: Current Every [place] Smoker    Packs/[place]: 1.00    Types: Cigarettes    Start date: 10/25/2018  . Smokeless tobacco: Never Used  . Tobacco comment: restarted again March 2020  Substance and Sexual Activity  . Alcohol use: Yes    Comment: rare, twice a year  . Drug use: No  . Sexual activity: Yes    Partners: Male    Birth control/protection: I.U.D.    Comment: Mirena inserted 03-2015-1st intercourse 49 yo-More than 5 partners  Other Topics Concern  . Not on file  Social  History Narrative   Homemaker   2 boys (79 and 68)   84.47 year old girl   Does Surveyor, minerals; now sews costumes   Social Determinants of Health   Financial Resource Strain:   . Difficulty of Paying Living Expenses:   Food Insecurity:   . Worried About Charity fundraiser in the Last Year:   . Arboriculturist in the Last Year:   Transportation Needs:   . Film/video editor (Medical):   Marland Kitchen Lack of Transportation (Non-Medical):   Physical Activity:   . Days of Exercise per Week:   . Minutes of Exercise per Session:   Stress:   . Feeling of Stress :   Social Connections:   . Frequency of Communication with Friends and Family:   . Frequency of Social Gatherings with Friends and Family:   . Attends Religious Services:   . Active Member of Clubs or Organizations:   . Attends Archivist Meetings:   Marland Kitchen Marital Status:   Intimate  Partner Violence:   . Fear of Current or Ex-Partner:   . Emotionally Abused:   Marland Kitchen Physically Abused:   . Sexually Abused:     Past Surgical History:  Procedure Laterality Date  . ABDOMINOPLASTY  2008  . BREAST BIOPSY Right 10/15/2012   Procedure: BREAST BIOPSY;  Surgeon: Haywood Lasso, MD;  Location: [facility];  Service: General;  Laterality: Right;  removal right breast mass  . BREAST EXCISIONAL BIOPSY Right   . BREAST SURGERY     Lift  . CESAREAN SECTION  2004, 2007  . CESAREAN SECTION N/A 02/07/2015   Procedure: CESAREAN SECTION;  Surgeon: Jerelyn Charles, MD;  Location: [facility];  Service: Obstetrics;  Laterality: N/A;  EDD: 02/13/15   . dialation and cutterage    . GYNECOLOGIC CRYOSURGERY  age 81  . HERNIA REPAIR  03/03/2019  . INTRAUTERINE DEVICE INSERTION  03/2015   Mirena at [facility]  . MICRODISCECTOMY LUMBAR     12-2017   . REDUCTION MAMMAPLASTY Bilateral   . ROBOTIC ASSITED PARTIAL NEPHRECTOMY Left 09/17/2018   Procedure: XI ROBOTIC ASSITED PARTIAL NEPHRECTOMY;  Surgeon: Cleon Gustin, MD;  Location: [facility];  Service: Urology;  Laterality: Left;  . TONSILLECTOMY  2000    Family History  Problem Relation Age of Onset  . Arthritis Mother   . High Cholesterol Mother   . High blood pressure Mother   . Hearing loss Father   . High Cholesterol Father   . High blood pressure Father   . Cancer Maternal Grandfather        lymphoma  . Cancer Paternal Grandfather        liver  . Alcohol abuse Paternal Grandfather   . Lung cancer Maternal Grandmother        and maternal uncle; small cell   . Arthritis Maternal Grandmother   . Hyperlipidemia Maternal Grandmother   . Hypertension Maternal Grandmother   . Hearing loss Maternal Grandmother   . Cancer Maternal Uncle        Lung cancer  . Hyperlipidemia Other   . Asthma Brother   . Alcohol abuse Paternal Grandmother   . Hypertension Paternal Grandmother   . Eczema Son   . Colon cancer Neg  Hx   . Breast cancer Neg Hx   . Allergic rhinitis Neg Hx   . Angioedema Neg Hx   . Urticaria Neg Hx     PMHx, SurgHx, SocialHx, FamHx,  Medications, and Allergies were reviewed in the Visit Navigator and updated as appropriate.   Patient Active Problem List   Diagnosis Date Noted  . Ventral hernia without obstruction or gangrene 02/18/2019  . 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 Excessive daytime sleepiness 12/25/2017  . Hypersomnia, persistent 12/25/2017  . Lumbar herniated disc 12/25/2017  . Obstructive sleep apnea treated with continuous positive airway pressure (CPAP) 12/08/2017  . Lumbar back pain with radiculopathy affecting left lower extremity 11/24/2017  . History of gestational diabetes mellitus 11/18/2017  . OCD (obsessive compulsive disorder)   . Hx of migraines   . Arthritis   . Sleep apnea 04/26/2017  . Vitamin D deficiency 10/24/2013  . History of kidney stones 10/25/2011  . Vitiligo   . Anxiety   . Tobacco abuse 08/27/1987    Social History   Tobacco Use  . Smoking status: Current Every [place] Smoker    Packs/[place]: 1.00    Types: Cigarettes    Start date: 10/25/2018  . Smokeless tobacco: Never Used  . Tobacco comment: restarted again March 2020  Substance Use Topics  . Alcohol use: Yes    Comment: rare, twice a year  . Drug use: No    Current Medications and Allergies:    Current Outpatient Medications:  .  buPROPion (WELLBUTRIN XL) 150 MG 24 hr tablet, Take 1 tablet (150 mg total) by mouth daily., Disp: 90 tablet, Rfl: 0 .  diazepam (VALIUM) 5 MG tablet, Take 1 tablet (5 mg total) by mouth every 12 (twelve) hours as needed for muscle spasms., Disp: 30 tablet, Rfl: 1 .  ibuprofen (ADVIL,MOTRIN) 200 MG tablet, Take 800 mg by mouth as needed., Disp: , Rfl:  .  levonorgestrel (MIRENA) 20 MCG/24HR IUD, 1 each by Intrauterine route once. Inserted by GYN on 03/2015 needs to be removed 03/2020., Disp: , Rfl:  .  OVER  THE COUNTER MEDICATION, Multi vit patch, Disp: , Rfl:  .  cetirizine (ZYRTEC) 10 MG tablet, Take 1 tablet (10 mg total) by mouth daily., Disp: 90 tablet, Rfl: 1 .  cyclobenzaprine (FLEXERIL) 10 MG tablet, Take 1 tablet (10 mg total) by mouth at bedtime., Disp: 30 tablet, Rfl: 0 .  rizatriptan (MAXALT) 10 MG tablet, Take 1 tablet (10 mg total) by mouth as needed for migraine. May repeat in 2 hours if needed, Disp: 10 tablet, Rfl: 3   Allergies  Allergen Reactions  . Ampicillin Hives    Can tolerate amoxicillin and penicillin  . Dust Mite Extract Cough  . Erythromycin Nausea And Vomiting  . Mold Extract [Trichophyton] Cough  . Other Hives    SAUERKRAUT  . Wine [Alcohol] Hives  . Ciprofloxacin Hives and Rash    Review of Systems   ROS  Negative unless otherwise specified per HPI.  Vitals:   Vitals:   12/24/19 1059  BP: 120/70  Pulse: 86  Temp: 98.3 F (36.8 C)  TempSrc: Temporal  SpO2: 97%  Weight: 196 lb 12.8 oz (89.3 kg)  Height: 5\' 5"  (1.651 m)     Body mass index is 32.75 kg/m.   Physical Exam:    Physical Exam Constitutional:      Appearance: She is well-developed.  HENT:     Head: Normocephalic and atraumatic.  Eyes:     Conjunctiva/sclera: Conjunctivae normal.  Pulmonary:     Effort: Pulmonary effort is normal.  Musculoskeletal:        General: Normal range of motion.  Cervical back: Normal range of motion and neck supple.  Skin:    General: Skin is warm and dry.  Neurological:     Mental Status: She is alert and oriented to person, place, and time.  Psychiatric:        Behavior: Behavior normal.        Thought Content: Thought content normal.        Judgment: Judgment normal.      Assessment and Plan:    Mackenzie Meyers was seen today for follow-up.  Diagnoses and all orders for this visit:  Depression, major, single episode, moderate (HCC) Will increase Wellbutrin to 300 mg daily.  She states that she will do this with her current supply and  will let us know if she is tolerating this, so we can send an updated prescription.  No red flags on discussion.  Acute bilateral thoracic back pain I did give patient permission to increase her Valium to 10 mg at next episode.  Additionally she states that if this does not work she would be willing to trial increased dose of Flexeril.  I have sent this in for her as well.  I gave her permission to trial increase of Flexeril 20 mg if she has another episode.  She knows to not do both Valium and Flexeril at the same time.  Allergic rhinitis, unspecified seasonality, unspecified trigger We will trial oral Zyrtec.  Other orders -     cetirizine (ZYRTEC) 10 MG tablet; Take 1 tablet (10 mg total) by mouth daily. -     cyclobenzaprine (FLEXERIL) 10 MG tablet; Take 1 tablet (10 mg total) by mouth at bedtime. -     rizatriptan (MAXALT) 10 MG tablet; Take 1 tablet (10 mg total) by mouth as needed for migraine. May repeat in 2 hours if needed  . Reviewed expectations re: course of current medical issues. . Discussed self-management of symptoms. . Outlined signs and symptoms indicating need for more acute intervention. . Patient verbalized understanding and all questions were answered. . See orders for this visit as documented in the electronic medical record. . Patient received an After Visit Summary.  CMA or LPN served as scribe during this visit. History, Physical, and Plan performed by medical provider. The above documentation has been reviewed and is accurate and complete.   Inda Coke, PA-C [place] 12/24/2019  Follow-up: No follow-ups on file.

## 2019-12-24 NOTE — Patient Instructions (Signed)
It was great to see you!  Please increase your Wellbutrin to 300 mg daily.  May increase valium to 10 mg daily or flexeril to 20 mg daily.  Message me via Mychart with how these increases are working for you.  Take care,  Inda Coke PA-C

## 2020-02-09 ENCOUNTER — Other Ambulatory Visit: Payer: Self-pay | Admitting: *Deleted

## 2020-02-09 MED ORDER — BUPROPION HCL ER (XL) 150 MG PO TB24: 150.0000 mg | ORAL_TABLET | Freq: Every day | ORAL | 0 refills | Status: DC

## 2020-02-09 MED ORDER — CYCLOBENZAPRINE HCL 10 MG PO TABS: 10.0000 mg | ORAL_TABLET | Freq: Every day | ORAL | 2 refills | Status: DC

## 2020-02-18 ENCOUNTER — Ambulatory Visit (INDEPENDENT_AMBULATORY_CARE_PROVIDER_SITE_OTHER): Payer: PRIVATE HEALTH INSURANCE | Admitting: Physician Assistant

## 2020-02-18 ENCOUNTER — Other Ambulatory Visit: Payer: Self-pay

## 2020-02-18 ENCOUNTER — Encounter: Payer: Self-pay | Admitting: Physician Assistant

## 2020-02-18 VITALS — BP 110/72 | HR 82 | Temp 98.5°F | Ht 65.0 in | Wt 197.2 lb

## 2020-02-18 DIAGNOSIS — R682 Dry mouth, unspecified: Secondary | ICD-10-CM

## 2020-02-18 DIAGNOSIS — C642 Malignant neoplasm of left kidney, except renal pelvis: Secondary | ICD-10-CM | POA: Diagnosis not present

## 2020-02-18 LAB — BASIC METABOLIC PANEL
BUN: 13 mg/dL (ref 6–23)
CO2: 25 mEq/L (ref 19–32)
Calcium: 9.8 mg/dL (ref 8.4–10.5)
Chloride: 102 mEq/L (ref 96–112)
Creatinine, Ser: 0.76 mg/dL (ref 0.40–1.20)
GFR: 81.69 mL/min (ref >60.00)
Glucose, Bld: 85 mg/dL (ref 70–99)
Potassium: 4.1 mEq/L (ref 3.5–5.1)
Sodium: 138 mEq/L (ref 135–145)

## 2020-02-18 LAB — HEMOGLOBIN A1C: Hgb A1c MFr Bld: 5.6 % (ref 4.6–6.5)

## 2020-02-18 NOTE — Patient Instructions (Signed)
It was great to see you!  Hold your allergy pill to see if this is contributing to your symptoms.  I will be in touch with your labs.  Take care,  Inda Coke PA-C

## 2020-02-18 NOTE — Progress Notes (Signed)
Mackenzie Meyers is a 49 y.o. female is here to follow up on Kidneys.  I acted as a Education administrator for [employer], PA-C Anselmo Pickler, LPN   History of Present Illness:   Chief Complaint  Patient presents with  . f/u on kidney labs  . Dry mouth    HPI   Renal cell carcinoma She is s/p robotic partial nephrectomy for L-sided renal cell carcinoma. Denies any acute concerns, but would like her kidney labs updated today.  Dry mouth Pt c/o dry mouth x 3 weeks. Only thing that helps is chewing gum. Has started taking her claritin more regularly during this time frame. Denies polyuria or polydipsia. She has a hx of gestational dm and is afraid that her A1c might be elevated.  Lab Results  Component Value Date   HGBA1C 5.5 11/15/2019     Wt Readings from Last 4 Encounters:  02/18/20 197 lb 4 oz (89.5 kg)  12/24/19 196 lb 12.8 oz (89.3 kg)  12/02/19 196 lb (88.9 kg)  11/15/19 197 lb (89.4 kg)     There are no preventive care reminders to display for this patient.  Past Medical History:  Diagnosis Date  . Anxiety    currently managed on CBD oil, was on medication briefly but made her OCD worse  . Arthritis    hands, DDD, and back 12-2017  . Breast mass, right 09/2012   removed because it was painful, was found to be calcified scar tissue  . Cancer ([place])    left renal mass  . Cervical dysplasia age 57  . Complication of anesthesia    Cries coming out of anesthesia   . Family history of adverse reaction to anesthesia    Maternal grandmother PONV  . Geographical tongue   . Gestational diabetes    gestational  . 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 History of kidney stones 10/2011  . 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 Hx of migraines   . OCD (obsessive compulsive disorder)    cleanliness with home and workspace  . Rosacea   . Sinus congestion 10/12/2012  . Sleep apnea 04/2017   patient is wearing C-pap    . Tobacco abuse 1989   max was 1.5 packs per [place]  . Vitamin D deficiency 10/2013   Value 29  . Vitiligo      Social History   Tobacco Use  . Smoking status: Current Every [place] Smoker    Packs/[place]: 1.00    Types: Cigarettes    Start date: 10/25/2018  . Smokeless tobacco: Never Used  . Tobacco comment: restarted again March 2020  Vaping Use  . Vaping Use: Never used  Substance Use Topics  . Alcohol use: Yes    Comment: rare, twice a year  . Drug use: No    Past Surgical History:  Procedure Laterality Date  . ABDOMINOPLASTY  2008  . BREAST BIOPSY Right 10/15/2012   Procedure: BREAST BIOPSY;  Surgeon: Haywood Lasso, MD;  Location: [facility];  Service: General;  Laterality: Right;  removal right breast mass  . BREAST EXCISIONAL BIOPSY Right   . BREAST SURGERY     Lift  . CESAREAN SECTION  2004, 2007  . CESAREAN SECTION N/A 02/07/2015   Procedure: CESAREAN SECTION;  Surgeon: Jerelyn Charles, MD;  Location: [facility];  Service: Obstetrics;  Laterality: N/A;  EDD: 02/13/15   .  dialation and cutterage    . GYNECOLOGIC CRYOSURGERY  age 57  . HERNIA REPAIR  03/03/2019  . INTRAUTERINE DEVICE INSERTION  03/2015   Mirena at [place]  . MICRODISCECTOMY LUMBAR     12-2017   . REDUCTION MAMMAPLASTY Bilateral   . ROBOTIC ASSITED PARTIAL NEPHRECTOMY Left 09/17/2018   Procedure: XI ROBOTIC ASSITED PARTIAL NEPHRECTOMY;  Surgeon: Cleon Gustin, MD;  Location: [facility];  Service: Urology;  Laterality: Left;  . TONSILLECTOMY  2000    Family History  Problem Relation Age of Onset  . Arthritis Mother   . High Cholesterol Mother   . High blood pressure Mother   . Hearing loss Father   . High Cholesterol Father   . High blood pressure Father   . Cancer Maternal Grandfather        lymphoma  . Cancer Paternal Grandfather        liver  . Alcohol abuse Paternal Grandfather   . Lung cancer Maternal Grandmother        and maternal uncle; small cell   . Arthritis  Maternal Grandmother   . Hyperlipidemia Maternal Grandmother   . Hypertension Maternal Grandmother   . Hearing loss Maternal Grandmother   . Cancer Maternal Uncle        Lung cancer  . Hyperlipidemia Other   . Asthma Brother   . Alcohol abuse Paternal Grandmother   . Hypertension Paternal Grandmother   . Eczema Son   . Colon cancer Neg Hx   . Breast cancer Neg Hx   . Allergic rhinitis Neg Hx   . Angioedema Neg Hx   . Urticaria Neg Hx     PMHx, SurgHx, SocialHx, FamHx, Medications, and Allergies were reviewed in the Visit Navigator and updated as appropriate.   Patient Active Problem List   Diagnosis Date Noted  . Ventral hernia without obstruction or gangrene 02/18/2019  . 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 Excessive daytime sleepiness 12/25/2017  . Hypersomnia, persistent 12/25/2017  . Lumbar herniated disc 12/25/2017  . Obstructive sleep apnea treated with continuous positive airway pressure (CPAP) 12/08/2017  . Lumbar back pain with radiculopathy affecting left lower extremity 11/24/2017  . History of gestational diabetes mellitus 11/18/2017  . OCD (obsessive compulsive disorder)   . Hx of migraines   . Arthritis   . Sleep apnea 04/26/2017  . Vitamin D deficiency 10/24/2013  . History of kidney stones 10/25/2011  . Vitiligo   . Anxiety   . Tobacco abuse 08/27/1987    Social History   Tobacco Use  . Smoking status: Current Every [place] Smoker    Packs/[place]: 1.00    Types: Cigarettes    Start date: 10/25/2018  . Smokeless tobacco: Never Used  . Tobacco comment: restarted again March 2020  Vaping Use  . Vaping Use: Never used  Substance Use Topics  . Alcohol use: Yes    Comment: rare, twice a year  . Drug use: No    Current Medications and Allergies:    Current Outpatient Medications:  .  buPROPion (WELLBUTRIN XL) 150 MG 24 hr tablet, Take 1 tablet (150 mg total) by mouth daily., Disp: 90 tablet, Rfl: 0 .   cetirizine (ZYRTEC) 10 MG tablet, Take 1 tablet (10 mg total) by mouth daily., Disp: 90 tablet, Rfl: 1 .  cyclobenzaprine (FLEXERIL) 10 MG tablet, Take 1 tablet (10 mg total) by mouth at bedtime., Disp: 30 tablet, Rfl: 2 .  diazepam (VALIUM) 5 MG tablet, Take 1 tablet (5 mg total) by mouth every 12 (twelve) hours as needed for muscle spasms., Disp: 30 tablet, Rfl: 1 .  ibuprofen (ADVIL,MOTRIN) 200 MG tablet, Take 800 mg by mouth as needed., Disp: , Rfl:  .  levonorgestrel (MIRENA) 20 MCG/24HR IUD, 1 each by Intrauterine route once. Inserted by GYN on 03/2015 needs to be removed 03/2020., Disp: , Rfl:  .  OVER THE COUNTER MEDICATION, Multi vit patch, Disp: , Rfl:  .  rizatriptan (MAXALT) 10 MG tablet, Take 1 tablet (10 mg total) by mouth as needed for migraine. May repeat in 2 hours if needed, Disp: 10 tablet, Rfl: 3   Allergies  Allergen Reactions  . Ampicillin Hives    Can tolerate amoxicillin and penicillin  . Dust Mite Extract Cough  . Erythromycin Nausea And Vomiting  . Mold Extract [Trichophyton] Cough  . Other Hives    SAUERKRAUT  . Wine [Alcohol] Hives  . Ciprofloxacin Hives and Rash    Review of Systems   ROS  Negative unless otherwise specified per HPI.  Vitals:   Vitals:   02/18/20 1417  BP: 110/72  Pulse: 82  Temp: 98.5 F (36.9 C)  TempSrc: Temporal  SpO2: 96%  Weight: 197 lb 4 oz (89.5 kg)  Height: 5\' 5"  (1.651 m)     Body mass index is 32.82 kg/m.   Physical Exam:    Physical Exam Vitals and nursing note reviewed.  Constitutional:      General: She is not in acute distress.    Appearance: She is well-developed. She is not ill-appearing or toxic-appearing.  Cardiovascular:     Rate and Rhythm: Normal rate and regular rhythm.     Pulses: Normal pulses.     Heart sounds: Normal heart sounds, S1 normal and S2 normal.     Comments: No LE edema Pulmonary:     Effort: Pulmonary effort is normal.     Breath sounds: Normal breath sounds.  Skin:     General: Skin is warm and dry.  Neurological:     Mental Status: She is alert.     GCS: GCS eye subscore is 4. GCS verbal subscore is 5. GCS motor subscore is 6.  Psychiatric:        Speech: Speech normal.        Behavior: Behavior normal. Behavior is cooperative.      Assessment and Plan:    Mackenzie Meyers was seen today for f/u on kidney labs and dry mouth.  Diagnoses and all orders for this visit:  Dry mouth Possible side effect of Wellbutrin and her antihistamine. Symptoms line up more with recent regular administration of her allergy pill. She is going to hold this to see if it makes a difference. Will also update her HgbA1c today per her request. Further recommendations based on lab results and clinical response. -     Basic metabolic panel; Future -     Hemoglobin A1c; Future -     Hemoglobin A1c -     Basic metabolic panel  Clear cell renal cell carcinoma, left (HCC) Will update her BMP today. If any concerns, will refer to urology.  . Reviewed expectations re: course of current medical issues. . Discussed self-management of symptoms. . Outlined signs and symptoms indicating need for more acute intervention. . Patient verbalized understanding and all questions were answered. . See orders for this visit as documented in the electronic medical record. . Patient received an After Visit  Summary.  CMA or LPN served as scribe during this visit. History, Physical, and Plan performed by medical provider. The above documentation has been reviewed and is accurate and complete.  Inda Coke, PA-C [place] 02/18/2020  Follow-up: No follow-ups on file.

## 2020-02-21 ENCOUNTER — Encounter: Payer: Self-pay | Admitting: Physician Assistant

## 2020-02-22 ENCOUNTER — Other Ambulatory Visit: Payer: Self-pay | Admitting: Physician Assistant

## 2020-02-22 MED ORDER — FLUCONAZOLE 150 MG PO TABS
150.0000 mg | ORAL_TABLET | ORAL | 0 refills | Status: DC
Start: 2020-02-22 — End: 2020-08-11

## 2020-04-05 ENCOUNTER — Ambulatory Visit: Payer: PRIVATE HEALTH INSURANCE | Admitting: Obstetrics and Gynecology

## 2020-04-17 ENCOUNTER — Encounter: Payer: Self-pay | Admitting: Obstetrics and Gynecology

## 2020-04-17 ENCOUNTER — Other Ambulatory Visit: Payer: Self-pay

## 2020-04-17 ENCOUNTER — Ambulatory Visit (INDEPENDENT_AMBULATORY_CARE_PROVIDER_SITE_OTHER): Payer: PRIVATE HEALTH INSURANCE | Admitting: Obstetrics and Gynecology

## 2020-04-17 VITALS — BP 118/78

## 2020-04-17 DIAGNOSIS — Z30432 Encounter for removal of intrauterine contraceptive device: Secondary | ICD-10-CM

## 2020-04-17 DIAGNOSIS — Z30430 Encounter for insertion of intrauterine contraceptive device: Secondary | ICD-10-CM

## 2020-04-17 DIAGNOSIS — Z30433 Encounter for removal and reinsertion of intrauterine contraceptive device: Secondary | ICD-10-CM

## 2020-04-17 NOTE — Progress Notes (Signed)
   Mackenzie Meyers  1970/12/06 [id number]  HPI The patient is a 49 y.o. [id number] who presents today for Mirena IUD insertion.  Her current Mirena IUD has been in place for 5 years and at this time for removal.  I counseled her on the Mirena IUD including in the initial period of time after insertion to expect cramping and/or abnormal bleeding, especially in the first 3-6 months use.  Approved for 5 years of contraception.  After this time period the period can get very light or some women become amenorrheic.  Potential hormonal side effects discussed.  Insertion risks include infection, uterine perforation and device migration into the abdomen which would require surgical removal, or the device can become dislodged causing pain or fall out of which would preclude effective contraception.  Should be effective immediately when switching out from a current nonexpired IUD.   Monthly self string checks (or more frequent) are recommended as IUD expulsion does occur.  She wanted to proceed with the Mirena IUD insertion and provided written consent.   Physical Exam  BP 118/78   General: Pleasant female, no acute distress, alert and oriented PELVIC EXAM: VULVA: normal appearing vulva with no masses, tenderness or lesions, VAGINA: normal appearing vagina with normal color and discharge, no lesions, CERVIX: normal appearing cervix without discharge or lesions, UTERUS: uterus is normal size, shape, consistency and nontender, ADNEXA: normal adnexa in size, nontender and no masses  Procedure note Mirena IUD removal and insertion The cervix and IUD strings were visualized.  The strings are grasped with a [place] forceps and gentle traction resulted in movable of the IUD intact as demonstrated to the patient and staff. The old IUD was discarded. The cervix was cleansed with a Betadine swab.  The anterior lip of the cervix was grasped with an [place]is clamp.  The cervix was gently dilated with the cervical os finder.   Endometrial pipelle was used to obtain a sound length of 8 cm.  Maintaining sterility, the flange on the IUD insertion tube was set to the appropriate length and the insertion tube was inserted into the cervix and endometrial cavity without difficulty until gentle fundal resistance was met.  The arms of the IUD were deployed and the Mirena was inserted without difficulty. The strings were trimmed to a length of 3 cm.  The patient tolerated the procedure well without any complication.  Lot [id number], expiration 04/2022  [name] present for exam and procedure  RTC 1 month for string check  Joseph Pierini MD 04/17/20

## 2020-04-18 ENCOUNTER — Encounter: Payer: Self-pay | Admitting: Gynecology

## 2020-05-06 ENCOUNTER — Other Ambulatory Visit: Payer: Self-pay | Admitting: Physician Assistant

## 2020-05-17 ENCOUNTER — Ambulatory Visit (INDEPENDENT_AMBULATORY_CARE_PROVIDER_SITE_OTHER): Payer: PRIVATE HEALTH INSURANCE | Admitting: Obstetrics and Gynecology

## 2020-05-17 ENCOUNTER — Encounter: Payer: Self-pay | Admitting: Obstetrics and Gynecology

## 2020-05-17 ENCOUNTER — Other Ambulatory Visit: Payer: Self-pay

## 2020-05-17 VITALS — BP 118/76

## 2020-05-17 DIAGNOSIS — Z30431 Encounter for routine checking of intrauterine contraceptive device: Secondary | ICD-10-CM

## 2020-05-17 NOTE — Progress Notes (Signed)
   Mackenzie Meyers [birth year] [id number]   HISTORY OF PRESENT ILLNESS 49 y.o. [id number] female presents for an IUD string check.  She had a Mirena IUD placed 04/17/2020.  She has had no problems vaginal bleeding or irregular discharge. She has no pelvic pain.     OBJECTIVE   PHYSICAL EXAM BP 118/76   General:  Patient is no acute distress she is alert and oriented Pelvic:  External genitalia appear to be within normal limits.  Bartholin's and Skene glands and urethra appear to be within normal limits.  There are no lesions on the labia or vaginal tissues.  On speculum exam, vaginal epithelium is moist, pink, and rugated.  There is no vaginal bleeding and minimal clear discharge.  The cervix is grossly normal without lesions or abnormality.  The IUD strings are seen protruding 2 cm from the external cervical os.  Aurora Mask (DNP student) present during the examination and performed the pelvic exam with me in attendance to confirm the exam findings   ASSESSMENT / PLAN  4 week IUD string check.  The patient is tolerating the IUD well and it appears to be in normal position with visualization of the strings today.  I reminded the patient to perform at least a monthly self check for the strings just to make sure it is still intact, and if she has any problems with pelvic pain or other concerns, she should come back in for evaluation.  The typical course after Mirena IUD placement involves irregular spotting and bleeding for the first several months after placement, and after that the irregular bleeding typically will improve and periods will become very light or even nonexistent in some women.   Joseph Pierini MD 05/17/20

## 2020-08-09 ENCOUNTER — Encounter: Payer: Self-pay | Admitting: Physician Assistant

## 2020-08-10 NOTE — Telephone Encounter (Signed)
Patient is scheduled for tomorrow with Dr.Parker.

## 2020-08-11 ENCOUNTER — Telehealth (INDEPENDENT_AMBULATORY_CARE_PROVIDER_SITE_OTHER): Payer: PRIVATE HEALTH INSURANCE | Admitting: Family Medicine

## 2020-08-11 VITALS — Ht 65.0 in | Wt 193.0 lb

## 2020-08-11 DIAGNOSIS — R059 Cough, unspecified: Secondary | ICD-10-CM

## 2020-08-11 MED ORDER — AZITHROMYCIN 250 MG PO TABS: ORAL_TABLET | ORAL | 0 refills | Status: DC

## 2020-08-11 MED ORDER — BENZONATATE 100 MG PO CAPS
100.0000 mg | ORAL_CAPSULE | Freq: Two times a day (BID) | ORAL | 0 refills | Status: DC | PRN
Start: 2020-08-11 — End: 2021-09-04

## 2020-08-11 MED ORDER — AZELASTINE HCL 0.1 % NA SOLN: 2.0000 | Freq: Two times a day (BID) | NASAL | 0 refills | Status: DC

## 2020-08-11 NOTE — Progress Notes (Signed)
   Maxyne D Raygoza is a 49 y.o. female who presents today for a virtual office visit.  Assessment/Plan:  Cough Covid and flu test both negative. No red flags. Likely viral URI. Will start Astelin nasal spray. Also send in pocket description for azithromycin. Will start Tessalon. Encourage good oral hydration. Discussed reasons to return to care. Follow-up as needed.    Subjective:  HPI:  Patient with cough and congestion for the past 5 to 7 days. Initially had associated fever diarrhea. Diarrhea has improved but is still been intermittent for the last couple of days. Fevers been into the 99 and 100 range. Highest temperature of 100.6. She has had more cough. No known sick contacts. Has been tested for Covid and flu both of which were negative. No reported chest pain or shortness of breath.       Objective/Observations  Physical Exam: Gen: NAD, resting comfortably Pulm: Normal work of breathing Neuro: Grossly normal, moves all extremities Psych: Normal affect and thought content  Virtual Visit via Video   I connected with Amarilys D Calo on 08/11/20 at  4:00 PM EST by a video enabled telemedicine application and verified that I am speaking with the correct person using two identifiers. The limitations of evaluation and management by telemedicine and the availability of in person appointments were discussed. The patient expressed understanding and agreed to proceed.   Patient location: Home Provider location: [place] participating in the virtual visit: Myself and Patient     Algis Greenhouse. Jerline Pain, MD 08/11/2020 3:10 PM

## 2020-08-28 ENCOUNTER — Other Ambulatory Visit: Payer: Self-pay | Admitting: Family Medicine

## 2020-08-30 ENCOUNTER — Other Ambulatory Visit: Payer: Self-pay | Admitting: Physician Assistant

## 2020-08-30 DIAGNOSIS — Z0000 Encounter for general adult medical examination without abnormal findings: Secondary | ICD-10-CM

## 2020-09-27 ENCOUNTER — Ambulatory Visit (INDEPENDENT_AMBULATORY_CARE_PROVIDER_SITE_OTHER): Payer: PRIVATE HEALTH INSURANCE | Admitting: Physician Assistant

## 2020-09-27 ENCOUNTER — Other Ambulatory Visit: Payer: Self-pay

## 2020-09-27 ENCOUNTER — Encounter: Payer: Self-pay | Admitting: Physician Assistant

## 2020-09-27 VITALS — BP 120/72 | HR 71 | Temp 98.3°F | Ht 65.0 in | Wt 193.5 lb

## 2020-09-27 DIAGNOSIS — R591 Generalized enlarged lymph nodes: Secondary | ICD-10-CM | POA: Diagnosis not present

## 2020-09-27 DIAGNOSIS — Z1211 Encounter for screening for malignant neoplasm of colon: Secondary | ICD-10-CM | POA: Diagnosis not present

## 2020-09-27 NOTE — Progress Notes (Signed)
Mackenzie Meyers is a 50 y.o. female here for a new problem.  I acted as a Education administrator for [employer], PA-C Anselmo Pickler, LPN   History of Present Illness:   Chief Complaint  Patient presents with  . Mass    HPI   LAD Pt c/o lump right side of neck, feels like a marble, found 2 days ago. Denies pain, tenderness, swallowing difficulty, unintentional weight loss, fevers, chills, bloody cough, night sweats, joint pain, recent URI, recent dental surgery.  She does have hx of renal cancer.   Colon cancer screening She is due for colon cancer screening.  Past Medical History:  Diagnosis Date  . Anxiety    currently managed on CBD oil, was on medication briefly but made her OCD worse  . Arthritis    hands, DDD, and back 12-2017  . Breast mass, right 09/2012   removed because it was painful, was found to be calcified scar tissue  . Cancer ([place])    left renal mass  . Cervical dysplasia age 89  . Complication of anesthesia    Cries coming out of anesthesia   . Family history of adverse reaction to anesthesia    Maternal grandmother PONV  . Geographical tongue   . Gestational diabetes    gestational  . 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 History of kidney stones 10/2011  . 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 Hx of migraines   . OCD (obsessive compulsive disorder)    cleanliness with home and workspace  . Rosacea   . Sinus congestion 10/12/2012  . Sleep apnea 04/2017   patient is wearing C-pap  . Tobacco abuse 1989   max was 1.5 packs per [place]  . Vitamin D deficiency 10/2013   Value 29  . Vitiligo      Social History   Tobacco Use  . Smoking status: Current Every [employer] Smoker    Packs/[place]: 1.00    Types: Cigarettes    Start date: 10/25/2018  . Smokeless tobacco: Never Used  . Tobacco comment: restarted again March 2020  Vaping Use  . Vaping Use: Never used  Substance Use Topics  . Alcohol use: Yes     Comment: rare, twice a year  . Drug use: No    Past Surgical History:  Procedure Laterality Date  . ABDOMINOPLASTY  2008  . BREAST BIOPSY Right 10/15/2012   Procedure: BREAST BIOPSY;  Surgeon: Haywood Lasso, MD;  Location: [facility];  Service: General;  Laterality: Right;  removal right breast mass  . BREAST EXCISIONAL BIOPSY Right   . BREAST SURGERY     Lift  . CESAREAN SECTION  2004, 2007  . CESAREAN SECTION N/A 02/07/2015   Procedure: CESAREAN SECTION;  Surgeon: Jerelyn Charles, MD;  Location: [facility];  Service: Obstetrics;  Laterality: N/A;  EDD: 02/13/15   . dialation and cutterage    . GYNECOLOGIC CRYOSURGERY  age 4  . HERNIA REPAIR  03/03/2019  . INTRAUTERINE DEVICE INSERTION  03/2015   Mirena at [facility]  . MICRODISCECTOMY LUMBAR     12-2017   . REDUCTION MAMMAPLASTY Bilateral   . ROBOTIC ASSITED PARTIAL NEPHRECTOMY Left 09/17/2018   Procedure: XI ROBOTIC ASSITED PARTIAL NEPHRECTOMY;  Surgeon: Cleon Gustin, MD;  Location: [facility];  Service: Urology;  Laterality: Left;  . TONSILLECTOMY  2000    Family History  Problem Relation Age  of Onset  . Arthritis Mother   . High Cholesterol Mother   . High blood pressure Mother   . Hearing loss Father   . High Cholesterol Father   . High blood pressure Father   . Cancer Maternal Grandfather        lymphoma  . Cancer Paternal Grandfather        liver  . Alcohol abuse Paternal Grandfather   . Lung cancer Maternal Grandmother        and maternal uncle; small cell   . Arthritis Maternal Grandmother   . Hyperlipidemia Maternal Grandmother   . Hypertension Maternal Grandmother   . Hearing loss Maternal Grandmother   . Cancer Maternal Uncle        Lung cancer  . Hyperlipidemia Other   . Asthma Brother   . Alcohol abuse Paternal Grandmother   . Hypertension Paternal Grandmother   . Eczema Son   . Colon cancer Neg Hx   . Breast cancer Neg Hx   . Allergic rhinitis Neg Hx   . Angioedema Neg Hx    . Urticaria Neg Hx     Allergies  Allergen Reactions  . Ampicillin Hives    Can tolerate amoxicillin and penicillin  . Dust Mite Extract Cough  . Erythromycin Nausea And Vomiting  . Mold Extract [Trichophyton] Cough  . Other Hives    SAUERKRAUT  . Wine [Alcohol] Hives  . Ciprofloxacin Hives and Rash    Current Medications:   Current Outpatient Medications:  .  cyclobenzaprine (FLEXERIL) 10 MG tablet, Take 1 tablet (10 mg total) by mouth at bedtime. (Patient taking differently: Take 10 mg by mouth at bedtime as needed.), Disp: 30 tablet, Rfl: 2 .  diazepam (VALIUM) 5 MG tablet, Take 1 tablet (5 mg total) by mouth every 12 (twelve) hours as needed for muscle spasms., Disp: 30 tablet, Rfl: 1 .  ibuprofen (ADVIL,MOTRIN) 200 MG tablet, Take 800 mg by mouth as needed., Disp: , Rfl:  .  levonorgestrel (MIRENA) 20 MCG/24HR IUD, 1 each by Intrauterine route once. Inserted by GYN on 03/2015 needs to be removed 03/2020., Disp: , Rfl:  .  rizatriptan (MAXALT) 10 MG tablet, Take 1 tablet (10 mg total) by mouth as needed for migraine. May repeat in 2 hours if needed, Disp: 10 tablet, Rfl: 3 .  benzonatate (TESSALON) 100 MG capsule, Take 1 capsule (100 mg total) by mouth 2 (two) times daily as needed for cough. (Patient not taking: Reported on 09/27/2020), Disp: 20 capsule, Rfl: 0   Review of Systems:   ROS Negative unless otherwise specified per HPI.  Vitals:   Vitals:   09/27/20 1529  BP: 120/72  Pulse: 71  Temp: 98.3 F (36.8 C)  TempSrc: Temporal  SpO2: 97%  Weight: 193 lb 8 oz (87.8 kg)  Height: 5\' 5"  (1.651 m)     Body mass index is 32.2 kg/m.  Physical Exam:   Physical Exam Vitals and nursing note reviewed.  Constitutional:      General: She is not in acute distress.    Appearance: She is well-developed. She is not ill-appearing, toxic-appearing or sickly-appearing.  Cardiovascular:     Rate and Rhythm: Normal rate and regular rhythm.     Pulses: Normal pulses.      Heart sounds: Normal heart sounds, S1 normal and S2 normal.     Comments: No LE edema Pulmonary:     Effort: Pulmonary effort is normal.     Breath sounds: Normal breath sounds.  Lymphadenopathy:     Cervical: Cervical adenopathy present.     Right cervical: Posterior cervical adenopathy present.  Skin:    General: Skin is warm, dry and intact.  Neurological:     Mental Status: She is alert.     GCS: GCS eye subscore is 4. GCS verbal subscore is 5. GCS motor subscore is 6.  Psychiatric:        Mood and Affect: Mood and affect normal.        Speech: Speech normal.        Behavior: Behavior normal. Behavior is cooperative.       Assessment and Plan:   Samone was seen today for mass.  Diagnoses and all orders for this visit:  Lymphadenopathy Rantz two of symptoms. I discussed my recommendation to wait at least 4 weeks for further evaluation as lymphadenopathy is most often a temporary reaction to infection or inflammation. If persists >4 weeks, recommend u/s. She is going to discuss with her husband. If she has significant anxiety about this, I did discuss that we could potentially get u/s sooner. Will update CBC and CMP today. -     CBC with Differential/Platelet -     Comprehensive metabolic panel  Special screening for malignant neoplasms, colon -     Ambulatory referral to Gastroenterology  CMA or LPN served as scribe during this visit. History, Physical, and Plan performed by medical provider. The above documentation has been reviewed and is accurate and complete.   Inda Coke, PA-C

## 2020-09-28 ENCOUNTER — Encounter: Payer: Self-pay | Admitting: Physician Assistant

## 2020-09-28 LAB — CBC WITH DIFFERENTIAL/PLATELET
Basophils Absolute: 0.1 10*3/uL (ref 0.0–0.1)
Basophils Relative: 1.2 % (ref 0.0–3.0)
Eosinophils Absolute: 0.2 10*3/uL (ref 0.0–0.7)
Eosinophils Relative: 1.7 % (ref 0.0–5.0)
HCT: 44.6 % (ref 36.0–46.0)
Hemoglobin: 15 g/dL (ref 12.0–15.0)
Lymphocytes Relative: 28.6 % (ref 12.0–46.0)
Lymphs Abs: 3.2 10*3/uL (ref 0.7–4.0)
MCHC: 33.8 g/dL (ref 30.0–36.0)
MCV: 86.8 fl (ref 78.0–100.0)
Monocytes Absolute: 0.9 10*3/uL (ref 0.1–1.0)
Monocytes Relative: 8.1 % (ref 3.0–12.0)
Neutro Abs: 6.7 10*3/uL (ref 1.4–7.7)
Neutrophils Relative %: 60.4 % (ref 43.0–77.0)
Platelets: 298 10*3/uL (ref 150.0–400.0)
RBC: 5.14 Mil/uL — ABNORMAL HIGH (ref 3.87–5.11)
RDW: 13.7 % (ref 11.5–15.5)
WBC: 11.1 10*3/uL — ABNORMAL HIGH (ref 4.0–10.5)

## 2020-09-28 LAB — COMPREHENSIVE METABOLIC PANEL
ALT: 13 U/L (ref 0–35)
AST: 18 U/L (ref 0–37)
Albumin: 4.5 g/dL (ref 3.5–5.2)
Alkaline Phosphatase: 75 U/L (ref 39–117)
BUN: 11 mg/dL (ref 6–23)
CO2: 30 mEq/L (ref 19–32)
Calcium: 10 mg/dL (ref 8.4–10.5)
Chloride: 104 mEq/L (ref 96–112)
Creatinine, Ser: 0.81 mg/dL (ref 0.40–1.20)
GFR: 86.53 mL/min (ref >60.00)
Glucose, Bld: 75 mg/dL (ref 70–99)
Potassium: 4.2 mEq/L (ref 3.5–5.1)
Sodium: 139 mEq/L (ref 135–145)
Total Bilirubin: 0.5 mg/dL (ref 0.2–1.2)
Total Protein: 7.7 g/dL (ref 6.0–8.3)

## 2020-09-29 ENCOUNTER — Other Ambulatory Visit: Payer: Self-pay | Admitting: Physician Assistant

## 2020-09-29 DIAGNOSIS — R591 Generalized enlarged lymph nodes: Secondary | ICD-10-CM

## 2020-09-29 DIAGNOSIS — C642 Malignant neoplasm of left kidney, except renal pelvis: Secondary | ICD-10-CM

## 2020-10-02 ENCOUNTER — Other Ambulatory Visit: Payer: Self-pay

## 2020-10-02 ENCOUNTER — Ambulatory Visit (HOSPITAL_COMMUNITY)
Admission: RE | Admit: 2020-10-02 | Discharge: 2020-10-02 | Disposition: A | Discharge status: Home / Self Care | Payer: PRIVATE HEALTH INSURANCE | Source: Ambulatory Visit | Attending: Physician Assistant | Admitting: Physician Assistant

## 2020-10-02 DIAGNOSIS — R591 Generalized enlarged lymph nodes: Secondary | ICD-10-CM | POA: Insufficient documentation

## 2020-10-02 DIAGNOSIS — C642 Malignant neoplasm of left kidney, except renal pelvis: Secondary | ICD-10-CM | POA: Insufficient documentation

## 2020-10-10 ENCOUNTER — Ambulatory Visit: Payer: PRIVATE HEALTH INSURANCE

## 2020-12-07 ENCOUNTER — Telehealth: Payer: Self-pay

## 2020-12-07 NOTE — Telephone Encounter (Signed)
Pt called asking if Aldona Bar would send in a prescription of diflucan to the pharmacy. Pt states she thinks she has a yeast infection. Please advise.

## 2020-12-11 MED ORDER — FLUCONAZOLE 150 MG PO TABS: ORAL_TABLET | ORAL | 0 refills | Status: DC

## 2020-12-11 NOTE — Telephone Encounter (Signed)
Spoke to pt asked her if she still needed Diflucan? Pt said yes, having vaginal discharge with itching. Hx of Yeast infection. Told pt okay will send Rx for Diflucan to the pharmacy. Asked pt if it takes one or two tablets to clear up? Pt said usually 2. Told her okay will send take one now and repeat one in 3 days. Pt verbalized understanding. Verbal order given by Dr. Jerline Pain okay to send Diflucan. Rx sent.

## 2020-12-11 NOTE — Addendum Note (Signed)
Addended by: Marian Sorrow on: 12/11/2020 08:43 AM   Modules accepted: Orders

## 2021-01-16 ENCOUNTER — Other Ambulatory Visit: Payer: Self-pay

## 2021-01-16 ENCOUNTER — Ambulatory Visit
Admission: RE | Admit: 2021-01-16 | Discharge: 2021-01-16 | Disposition: A | Discharge status: Home / Self Care | Payer: PRIVATE HEALTH INSURANCE | Source: Ambulatory Visit | Attending: Physician Assistant | Admitting: Physician Assistant

## 2021-01-16 DIAGNOSIS — Z0000 Encounter for general adult medical examination without abnormal findings: Secondary | ICD-10-CM

## 2021-05-07 ENCOUNTER — Telehealth: Payer: Self-pay

## 2021-05-07 ENCOUNTER — Other Ambulatory Visit: Payer: Self-pay | Admitting: Physician Assistant

## 2021-05-07 MED ORDER — DIAZEPAM 5 MG PO TABS: 5.0000 mg | ORAL_TABLET | Freq: Two times a day (BID) | ORAL | 1 refills | Status: DC | PRN

## 2021-05-07 NOTE — Telephone Encounter (Signed)
Please advise for medication refill below.

## 2021-05-07 NOTE — Telephone Encounter (Signed)
LAST APPOINTMENT DATE:  09/27/20  NEXT APPOINTMENT DATE: None  MEDICATION:diazepam (VALIUM) 5 MG tablet  PHARMACY:[id number] [address]

## 2021-06-19 HISTORY — PX: FOOT SURGERY: SHX648

## 2021-07-24 ENCOUNTER — Encounter: Payer: Self-pay | Admitting: Physician Assistant

## 2021-07-25 ENCOUNTER — Other Ambulatory Visit: Payer: Self-pay | Admitting: Physician Assistant

## 2021-07-30 ENCOUNTER — Other Ambulatory Visit: Payer: Self-pay | Admitting: Physician Assistant

## 2021-07-30 MED ORDER — VALACYCLOVIR HCL 500 MG PO TABS: 500.0000 mg | ORAL_TABLET | Freq: Every day | ORAL | 1 refills | Status: AC

## 2021-08-25 ENCOUNTER — Encounter: Payer: Self-pay | Admitting: Physician Assistant

## 2021-08-28 ENCOUNTER — Other Ambulatory Visit: Payer: Self-pay | Admitting: Physician Assistant

## 2021-08-28 MED ORDER — FLUCONAZOLE 150 MG PO TABS
ORAL_TABLET | ORAL | 0 refills | Status: DC
Start: 2021-08-28 — End: 2021-09-04

## 2021-09-04 ENCOUNTER — Encounter: Payer: Self-pay | Admitting: Physician Assistant

## 2021-09-04 ENCOUNTER — Ambulatory Visit (INDEPENDENT_AMBULATORY_CARE_PROVIDER_SITE_OTHER): Payer: PRIVATE HEALTH INSURANCE | Admitting: Physician Assistant

## 2021-09-04 ENCOUNTER — Other Ambulatory Visit: Payer: Self-pay

## 2021-09-04 VITALS — BP 106/62 | HR 85 | Temp 98.1°F | Ht 65.0 in | Wt 198.0 lb

## 2021-09-04 DIAGNOSIS — B029 Zoster without complications: Secondary | ICD-10-CM | POA: Diagnosis not present

## 2021-09-04 NOTE — Progress Notes (Signed)
Mackenzie Meyers is a 51 y.o. female here for shingles.  History of Present Illness:   Chief Complaint  Patient presents with   Herpes Zoster    Pt went to walk in clinic on 12/26 with ear infection , went back on 12/29 and was diagnosed with Shingles in Left ear canal.    HPI  Shingles On 08/20/21, Mackenzie Meyers presented to a walk in clinic while in [place] due to experiencing severe left ear pain. According to pt, during this visit she was dx with an ear infection and prescribed amoxicillin x 10 days. Although she was compliant with the medication, it provided her with no relief and in fact made her pain worse. Pt described the pin as though an ice pick was in her ear and wrapping around to the back of her head. After having increased pain, she returned to the clinic three days later and was told she had shingles in her left ear. Following this she was started on prednisone 20 mg taper from 6 tablets to 1 x 5 days and valacyclovir 500 mg daily.   Currently she has completed both medications, but wanted to make sure the shingles had resolved prior to having her colonoscopy dx. At this time she is feeling good and managing well. Denies any ear discharge, ear pain, facial paralysis , taste changes, vertigo, or tingling/numbness.   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   History of kidney stones 10/2011   HPV (human papilloma virus)  anogenital infection 02/2015   Normal cytology   Hx of migraines    OCD (obsessive compulsive disorder)    cleanliness with home and workspace   Rosacea    Sinus congestion 10/12/2012   Sleep apnea 04/2017   patient is wearing C-pap   Tobacco abuse 1989   max was 1.5 packs per [place]   Vitamin D deficiency 10/2013   Value 29   Vitiligo      Social History   Tobacco Use   Smoking status: Every [place]    Packs/[place]: 1.00    Types: Cigarettes    Start date: 10/25/2018   Smokeless tobacco: Never   Tobacco comments:    restarted again March 2020  Vaping Use   Vaping Use: Never used  Substance Use Topics   Alcohol use: Yes    Comment: rare, twice a year   Drug use: No    Past Surgical History:  Procedure Laterality Date   ABDOMINOPLASTY  2008   BREAST BIOPSY Right 10/15/2012   Procedure: BREAST BIOPSY;  Surgeon: Haywood Lasso, MD;  Location: [facility];  Service: General;  Laterality: Right;  removal right breast mass   BREAST EXCISIONAL BIOPSY Right    BREAST SURGERY     Lift   CESAREAN  SECTION  2004, 2007   CESAREAN SECTION N/A 02/07/2015   Procedure: CESAREAN SECTION;  Surgeon: Jerelyn Charles, MD;  Location: [facility];  Service: Obstetrics;  Laterality: N/A;  EDD: 02/13/15    dialation and cutterage     FOOT SURGERY Right 06/19/2021   GYNECOLOGIC CRYOSURGERY  age 25   HERNIA REPAIR  03/03/2019   INTRAUTERINE DEVICE INSERTION  03/2015   Mirena at [facility]   MICRODISCECTOMY LUMBAR     12-2017    REDUCTION MAMMAPLASTY Bilateral    ROBOTIC ASSITED PARTIAL NEPHRECTOMY Left 09/17/2018   Procedure: XI ROBOTIC ASSITED PARTIAL NEPHRECTOMY;  Surgeon: Cleon Gustin, MD;  Location: [facility];  Service: Urology;  Laterality: Left;   TONSILLECTOMY  2000    Family History  Problem Relation Age of Onset   Arthritis Mother    High Cholesterol Mother    High blood pressure Mother    Hearing loss Father    High Cholesterol Father    High blood pressure  Father    Cancer Maternal Grandfather        lymphoma   Cancer Paternal Grandfather        liver   Alcohol abuse Paternal Grandfather    Lung cancer Maternal Grandmother        and maternal uncle; small cell    Arthritis Maternal Grandmother    Hyperlipidemia Maternal Grandmother    Hypertension Maternal Grandmother    Hearing loss Maternal Grandmother    Cancer Maternal Uncle        Lung cancer   Hyperlipidemia Other    Asthma Brother    Alcohol abuse Paternal Grandmother    Hypertension Paternal Grandmother    Eczema Son    Colon cancer Neg Hx    Breast cancer Neg Hx    Allergic rhinitis Neg Hx    Angioedema Neg Hx    Urticaria Neg Hx     Allergies  Allergen Reactions   Ampicillin Hives    Can tolerate amoxicillin and penicillin   Dust Mite Extract Cough   Erythromycin Nausea And Vomiting   Mold Extract [Trichophyton] Cough   Other Hives    SAUERKRAUT   Wine [Alcohol] Hives   Ciprofloxacin Hives and Rash    Current Medications:   Current Outpatient Medications:    diazepam (VALIUM) 5 MG tablet, Take 1 tablet (5 mg total) by mouth every 12 (twelve) hours as needed for muscle spasms., Disp: 30 tablet, Rfl: 1   ibuprofen (ADVIL,MOTRIN) 200 MG tablet, Take 800 mg by mouth as needed., Disp: , Rfl:    levonorgestrel (MIRENA) 20 MCG/24HR IUD, 1 each by Intrauterine route once. Inserted by GYN on 03/2015 needs to be removed 03/2020., Disp: , Rfl:    rizatriptan (MAXALT) 10 MG tablet, Take 1 tablet (10 mg total) by mouth as needed for migraine. May repeat in 2 hours if needed, Disp: 10 tablet, Rfl: 3   valACYclovir (VALTREX) 500 MG tablet, Take 1 tablet (500 mg total) by mouth daily. (Patient taking differently: Take 500 mg by mouth daily as needed.), Disp: 90 tablet, Rfl: 1   Review of Systems:   ROS Negative unless otherwise specified per HPI. Vitals:   Vitals:   09/04/21 1327  BP: 106/62  Pulse: 85  Temp: 98.1 F (36.7 C)  TempSrc: Temporal  SpO2: 98%  Weight:  198 lb (89.8 kg)  Height: 5\' 5"  (1.651 m)     Body mass index is 32.95 kg/m.  Physical Exam:   Physical  Exam Vitals and nursing note reviewed.  Constitutional:      General: She is not in acute distress.    Appearance: She is well-developed. She is not ill-appearing or toxic-appearing.  Cardiovascular:     Rate and Rhythm: Normal rate and regular rhythm.     Pulses: Normal pulses.     Heart sounds: Normal heart sounds, S1 normal and S2 normal.  Pulmonary:     Effort: Pulmonary effort is normal.     Breath sounds: Normal breath sounds.  Skin:    General: Skin is warm and dry.  Neurological:     Mental Status: She is alert.     GCS: GCS eye subscore is 4. GCS verbal subscore is 5. GCS motor subscore is 6.  Psychiatric:        Speech: Speech normal.        Behavior: Behavior normal. Behavior is cooperative.    Assessment and Plan:   Herpes Zoster in Left Ear No red flags on exam No vesicles or lesions present Patient has completed prescribed medications  Follow up if return of symptoms or concerns occurs  I,Havlyn C Ratchford,acting as a scribe for [employer], PA.,have documented all relevant documentation on the behalf of Inda Coke, PA,as directed by  Inda Coke, PA while in the presence of Inda Coke, [place].  I, Inda Coke, [place], have reviewed all documentation for this visit. The documentation on 09/04/21 for the exam, diagnosis, procedures, and orders are all accurate and complete.   Inda Coke, PA-C

## 2022-01-14 ENCOUNTER — Encounter: Payer: Self-pay | Admitting: Physician Assistant

## 2022-01-14 ENCOUNTER — Ambulatory Visit (INDEPENDENT_AMBULATORY_CARE_PROVIDER_SITE_OTHER): Payer: PRIVATE HEALTH INSURANCE | Admitting: Physician Assistant

## 2022-01-14 VITALS — BP 120/70 | HR 67 | Temp 98.1°F | Ht 65.0 in | Wt 198.4 lb

## 2022-01-14 DIAGNOSIS — Z124 Encounter for screening for malignant neoplasm of cervix: Secondary | ICD-10-CM

## 2022-01-14 DIAGNOSIS — C642 Malignant neoplasm of left kidney, except renal pelvis: Secondary | ICD-10-CM

## 2022-01-14 DIAGNOSIS — Z8632 Personal history of gestational diabetes: Secondary | ICD-10-CM

## 2022-01-14 DIAGNOSIS — Z0000 Encounter for general adult medical examination without abnormal findings: Secondary | ICD-10-CM | POA: Diagnosis not present

## 2022-01-14 DIAGNOSIS — F320 Major depressive disorder, single episode, mild: Secondary | ICD-10-CM

## 2022-01-14 DIAGNOSIS — E559 Vitamin D deficiency, unspecified: Secondary | ICD-10-CM

## 2022-01-14 DIAGNOSIS — Z720 Tobacco use: Secondary | ICD-10-CM

## 2022-01-14 DIAGNOSIS — F411 Generalized anxiety disorder: Secondary | ICD-10-CM

## 2022-01-14 MED ORDER — FLUCONAZOLE 150 MG PO TABS: ORAL_TABLET | ORAL | 0 refills | Status: DC

## 2022-01-14 MED ORDER — DIAZEPAM 5 MG PO TABS: 5.0000 mg | ORAL_TABLET | Freq: Two times a day (BID) | ORAL | 1 refills | Status: AC | PRN

## 2022-01-14 NOTE — Patient Instructions (Addendum)
It was great to see you!  Please make an appointment with the lab on your way out. I would like for you to return for lab work within 1-2 weeks. After midnight on the [place] of the lab draw, please do not eat anything. You may have water, black coffee, unsweetened tea.  Take care,  Jameis Newsham   

## 2022-01-14 NOTE — Progress Notes (Signed)
Subjective:    Mackenzie Meyers is a 51 y.o. female and is here for a comprehensive physical exam.  HPI  Health Maintenance Due  Topic Date Due   PAP SMEAR-Modifier  10/27/2021    Acute Concerns: None  Chronic Issues: Anxiety/Depression  Patient is currently taking Prozac 20 mg daily. She is tolerating her medication well without any side effects. 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 She notes she has been currently seeing at [facility] for her symptoms. She has been having extreme irritability for the past few months. 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 She is currently waiting for ADHD testing. She was started on Zoloft but she was experiencing side effects (vivid dreams) so she was switched to Prozac. She is currently compliant with taking Prozac 20 mg. Denies any SI/HI thoughts.   Renal Cell Carcinoma UTD with her specialist. She would like her annual "kidney check" today. Denies new concerns. Feels well.  Hx of Gestational Diabetes Due for repeat A1c. She would like this updated today. She is eating healthier and walking daily.  Health Maintenance: Immunizations -- UTD Colonoscopy -- UTD  Mammogram -- Due , last done 01/16/2021 PAP -- UTD, due 2023 Bone Density -- N/A Diet -- Balanced Sleep habits -- No concern  Exercise -- Likes to walk. Weight -- 198 Ib (90 kg) Mood -- depressed Weight history: Wt Readings from Last 10 Encounters:  01/14/22 198 lb 6.1 oz (90 kg)  09/04/21 198 lb (89.8 kg)  09/27/20 193 lb 8 oz (87.8 kg)  08/11/20 193 lb (87.5 kg)  02/18/20 197 lb 4 oz (89.5 kg)  12/24/19 196 lb 12.8 oz (89.3 kg)  12/02/19 196 lb (88.9 kg)  11/15/19 197 lb (89.4 kg)  11/20/18 197 lb (89.4 kg)  10/28/18 196 lb (88.9 kg)   Body mass index is 33.01 kg/m. No LMP recorded. (Menstrual status: IUD). Alcohol use:  reports current alcohol use. Tobacco use: continues to smoke  daily Tobacco Use: High Risk   Smoking Tobacco Use: Every [place]   Smokeless Tobacco Use: Never   Passive Exposure: Not on file        01/14/2022    1:50 PM  Depression screen PHQ 2/9  Decreased Interest 1  Down, Depressed, Hopeless 0  PHQ - 2 Score 1  Altered sleeping 3  Tired, decreased energy 3  Change in appetite 3  Feeling bad or failure about yourself  1  Trouble concentrating 1  Moving slowly or fidgety/restless 0  Suicidal thoughts 0  PHQ-9 Score 12  Difficult doing work/chores Somewhat difficult     Other providers/specialists: Patient Care Team: Inda Coke, [place] as PCP - General (Physician Assistant) Alyson Ingles Candee Furbish, MD as Consulting Physician (Urology)    PMHx, SurgHx, SocialHx, Medications, and Allergies were reviewed in the Visit Navigator and updated as appropriate.   Past Medical History:  Diagnosis Date   Anxiety    currently managed on CBD oil, was on medication briefly but made her OCD worse   Arthritis    hands, DDD, and back 12-2017   Breast mass, right 09/2012   removed because it was painful, was found to be calcified scar tissue   Cancer ([place])    left renal mass   Cervical dysplasia age 55   Complication of anesthesia    Cries coming out of anesthesia    Family history of adverse reaction to anesthesia    Maternal grandmother  PONV   Geographical tongue    Gestational diabetes    gestational   Heart murmur    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   History of kidney stones 10/2011   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   Normal cytology   Hx of migraines    OCD (obsessive compulsive disorder)    cleanliness with home and workspace   Rosacea    Sinus congestion 10/12/2012   Sleep apnea 04/2017   patient is wearing C-pap   Tobacco abuse 1989   max was 1.5 packs per [place]   Vitamin D deficiency 10/2013   Value 29   Vitiligo      Past Surgical History:  Procedure Laterality Date   ABDOMINOPLASTY  2008    BREAST BIOPSY Right 10/15/2012   Procedure: BREAST BIOPSY;  Surgeon: Haywood Lasso, MD;  Location: [facility];  Service: General;  Laterality: Right;  removal right breast mass   BREAST EXCISIONAL BIOPSY Right    BREAST SURGERY     Lift   CESAREAN SECTION  2004, 2007   CESAREAN SECTION N/A 02/07/2015   Procedure: CESAREAN SECTION;  Surgeon: Jerelyn Charles, MD;  Location: [facility];  Service: Obstetrics;  Laterality: N/A;  EDD: 02/13/15    dialation and cutterage     FOOT SURGERY Right 06/19/2021   GYNECOLOGIC CRYOSURGERY  age 73   HERNIA REPAIR  03/03/2019   INTRAUTERINE DEVICE INSERTION  03/2015   Mirena at [facility]   MICRODISCECTOMY LUMBAR     12-2017    REDUCTION MAMMAPLASTY Bilateral    ROBOTIC ASSITED PARTIAL NEPHRECTOMY Left 09/17/2018   Procedure: XI ROBOTIC ASSITED PARTIAL NEPHRECTOMY;  Surgeon: Cleon Gustin, MD;  Location: [facility];  Service: Urology;  Laterality: Left;   TONSILLECTOMY  2000     Family History  Problem Relation Age of Onset   Arthritis Mother    High Cholesterol Mother    High blood pressure Mother    Hearing loss Father    High Cholesterol Father    High blood pressure Father    Diabetes Father    Asthma Brother    Lung cancer Maternal Grandmother        and maternal uncle; small cell    Arthritis Maternal Grandmother    Hyperlipidemia Maternal Grandmother    Hypertension Maternal Grandmother    Hearing loss Maternal Grandmother    Cancer Maternal Grandfather        lymphoma   Alcohol abuse Paternal Grandmother    Hypertension Paternal Grandmother    Cancer Paternal Grandfather        liver   Alcohol abuse Paternal Grandfather    Eczema Son    Cancer Maternal Uncle        Lung cancer   Hyperlipidemia Other    Colon cancer Neg Hx    Breast cancer Neg Hx    Allergic rhinitis Neg Hx    Angioedema Neg Hx    Urticaria Neg Hx     Social History   Tobacco Use   Smoking status: Every [place]    Packs/[place]:  1.00    Types: Cigarettes    Start date: 10/25/2018   Smokeless tobacco: Never   Tobacco comments:    restarted again March 2020  Vaping Use   Vaping Use: Never used  Substance Use Topics   Alcohol use: Yes    Comment: rare, twice a year   Drug use: No    Review  of Systems:   Review of Systems  Constitutional:  Negative for chills, fever, malaise/fatigue and weight loss.  HENT:  Negative for hearing loss, sinus pain and sore throat.   Respiratory:  Negative for cough and hemoptysis.   Cardiovascular:  Negative for chest pain, palpitations, leg swelling and PND.  Gastrointestinal:  Negative for abdominal pain, constipation, diarrhea, heartburn, nausea and vomiting.  Genitourinary:  Negative for dysuria, frequency and urgency.  Musculoskeletal:  Negative for back pain, myalgias and neck pain.  Skin:  Negative for itching and rash.  Neurological:  Negative for dizziness, tingling, seizures and headaches.  Endo/Heme/Allergies:  Negative for polydipsia.  Psychiatric/Behavioral:  Negative for depression. The patient is not nervous/anxious.    Objective:   BP 120/70 (BP Location: Left Arm, Patient Position: Sitting, Cuff Size: Large)   Pulse 67   Temp 98.1 F (36.7 C) (Temporal)   Ht '5\' 5"'$  (1.651 m)   Wt 198 lb 6.1 oz (90 kg)   SpO2 96%   BMI 33.01 kg/m  Body mass index is 33.01 kg/m.   General Appearance:    Alert, cooperative, no distress, appears stated age  Head:    Normocephalic, without obvious abnormality, atraumatic  Eyes:    PERRL, conjunctiva/corneas clear, EOM's intact, fundi    benign, both eyes  Ears:    Normal TM's and external ear canals, both ears  Nose:   Nares normal, septum midline, mucosa normal, no drainage    or sinus tenderness  Throat:   Lips, mucosa, and tongue normal; teeth and gums normal  Neck:   Supple, symmetrical, trachea midline, no adenopathy;    thyroid:  no enlargement/tenderness/nodules; no carotid   bruit or JVD  Back:     Symmetric,  no curvature, ROM normal, no CVA tenderness  Lungs:     Clear to auscultation bilaterally, respirations unlabored  Chest Wall:    No tenderness or deformity   Heart:    Regular rate and rhythm, S1 and S2 normal, no murmur, rub or gallop  Breast Exam:    Deferred  Abdomen:     Soft, non-tender, bowel sounds active all four quadrants,    no masses, no organomegaly  Genitalia:    Deferred  Extremities:   Extremities normal, atraumatic, no cyanosis or edema  Pulses:   2+ and symmetric all extremities  Skin:   Skin color, texture, turgor normal, no rashes or lesions  Lymph nodes:   Cervical, supraclavicular, and axillary nodes normal  Neurologic:   CNII-XII intact, normal strength, sensation and reflexes    throughout    Assessment/Plan:   Routine physical examination Today patient counseled on age appropriate routine health concerns for screening and prevention, each reviewed and up to date or declined. Immunizations reviewed and up to date or declined. Labs ordered and reviewed. Risk factors for depression reviewed and negative. Hearing function and visual acuity are intact. ADLs screened and addressed as needed. Functional ability and level of safety reviewed and appropriate. Education, counseling and referrals performed based on assessed risks today. Patient provided with a copy of personalized plan for preventive services.  GAD (generalized anxiety disorder); Current mild episode of major depressive disorder without prior episode ([place]) Improving Continue prozac per specialist  Clear cell renal cell carcinoma, left (HCC) Well controlled Update CMP and UA Continue mgmt per urology  History of gestational diabetes mellitus Update A1c and provide recommendations accordingly  Vitamin D deficiency Update Vit D and provide recommendations accordingly  Tobacco abuse Counseled; she is  not ready to quit yet  Patient Counseling: '[x]'$    Nutrition: Stressed importance of moderation in  sodium/caffeine intake, saturated fat and cholesterol, caloric balance, sufficient intake of fresh fruits, vegetables, fiber, calcium, iron, and 1 mg of folate supplement per [place] (for females capable of pregnancy).  '[x]'$    Stressed the importance of regular exercise.   '[x]'$    Substance Abuse: Discussed cessation/primary prevention of tobacco, alcohol, or other drug use; driving or other dangerous activities under the influence; availability of treatment for abuse.   '[x]'$    Injury prevention: Discussed safety belts, safety helmets, smoke detector, smoking near bedding or upholstery.   '[x]'$    Sexuality: Discussed sexually transmitted diseases, partner selection, use of condoms, avoidance of unintended pregnancy  and contraceptive alternatives.  '[x]'$    Dental health: Discussed importance of regular tooth brushing, flossing, and dental visits.  '[x]'$    Health maintenance and immunizations reviewed. Please refer to Health maintenance section.    I,Savera Zaman,acting as a Education administrator for [employer], PA.,have documented all relevant documentation on the behalf of Inda Coke, PA,as directed by  Inda Coke, PA while in the presence of Inda Coke, [place].   I, Inda Coke, [place], have reviewed all documentation for this visit. The documentation on 01/14/22 for the exam, diagnosis, procedures, and orders are all accurate and complete.  Inda Coke, PA-C [place]

## 2022-01-16 ENCOUNTER — Other Ambulatory Visit (INDEPENDENT_AMBULATORY_CARE_PROVIDER_SITE_OTHER): Payer: PRIVATE HEALTH INSURANCE

## 2022-01-16 DIAGNOSIS — E559 Vitamin D deficiency, unspecified: Secondary | ICD-10-CM

## 2022-01-16 DIAGNOSIS — F411 Generalized anxiety disorder: Secondary | ICD-10-CM | POA: Diagnosis not present

## 2022-01-16 DIAGNOSIS — Z720 Tobacco use: Secondary | ICD-10-CM | POA: Diagnosis not present

## 2022-01-16 DIAGNOSIS — F17210 Nicotine dependence, cigarettes, uncomplicated: Secondary | ICD-10-CM

## 2022-01-16 DIAGNOSIS — Z8632 Personal history of gestational diabetes: Secondary | ICD-10-CM | POA: Diagnosis not present

## 2022-01-16 DIAGNOSIS — C642 Malignant neoplasm of left kidney, except renal pelvis: Secondary | ICD-10-CM | POA: Diagnosis not present

## 2022-01-16 LAB — CBC WITH DIFFERENTIAL/PLATELET
Basophils Absolute: 0 10*3/uL (ref 0.0–0.1)
Basophils Relative: 0.6 % (ref 0.0–3.0)
Eosinophils Absolute: 0.1 10*3/uL (ref 0.0–0.7)
Eosinophils Relative: 1.5 % (ref 0.0–5.0)
HCT: 44 % (ref 36.0–46.0)
Hemoglobin: 14.9 g/dL (ref 12.0–15.0)
Lymphocytes Relative: 25.3 % (ref 12.0–46.0)
Lymphs Abs: 2.1 10*3/uL (ref 0.7–4.0)
MCHC: 33.8 g/dL (ref 30.0–36.0)
MCV: 87.6 fl (ref 78.0–100.0)
Monocytes Absolute: 0.5 10*3/uL (ref 0.1–1.0)
Monocytes Relative: 6.6 % (ref 3.0–12.0)
Neutro Abs: 5.4 10*3/uL (ref 1.4–7.7)
Neutrophils Relative %: 66 % (ref 43.0–77.0)
Platelets: 236 10*3/uL (ref 150.0–400.0)
RBC: 5.03 Mil/uL (ref 3.87–5.11)
RDW: 13.6 % (ref 11.5–15.5)
WBC: 8.1 10*3/uL (ref 4.0–10.5)

## 2022-01-16 LAB — LIPID PANEL
Cholesterol: 173 mg/dL (ref 0–200)
HDL: 43.9 mg/dL (ref >39.00)
LDL Cholesterol: 117 mg/dL — ABNORMAL HIGH (ref 0–99)
NonHDL: 129.41
Total CHOL/HDL Ratio: 4
Triglycerides: 62 mg/dL (ref 0.0–149.0)
VLDL: 12.4 mg/dL (ref 0.0–40.0)

## 2022-01-16 LAB — IBC + FERRITIN
Ferritin: 69.1 ng/mL (ref 10.0–291.0)
Iron: 72 ug/dL (ref 42–145)
Saturation Ratios: 20.6 % (ref 20.0–50.0)
TIBC: 350 ug/dL (ref 250.0–450.0)
Transferrin: 250 mg/dL (ref 212.0–360.0)

## 2022-01-16 LAB — URINALYSIS, ROUTINE W REFLEX MICROSCOPIC
Bilirubin Urine: NEGATIVE
Hgb urine dipstick: NEGATIVE
Ketones, ur: NEGATIVE
Leukocytes,Ua: NEGATIVE
Nitrite: NEGATIVE
RBC / HPF: NONE SEEN (ref 0–?)
Specific Gravity, Urine: <=1.005 — AB (ref 1.000–1.030)
Total Protein, Urine: NEGATIVE
Urine Glucose: NEGATIVE
Urobilinogen, UA: 0.2 (ref 0.0–1.0)
pH: 7.5 (ref 5.0–8.0)

## 2022-01-16 LAB — VITAMIN D 25 HYDROXY (VIT D DEFICIENCY, FRACTURES): VITD: 29.64 ng/mL — ABNORMAL LOW (ref 30.00–100.00)

## 2022-01-16 LAB — HEMOGLOBIN A1C: Hgb A1c MFr Bld: 5.7 % (ref 4.6–6.5)

## 2022-01-16 LAB — VITAMIN B12: Vitamin B-12: 267 pg/mL (ref 211–911)

## 2022-01-17 ENCOUNTER — Other Ambulatory Visit (INDEPENDENT_AMBULATORY_CARE_PROVIDER_SITE_OTHER): Payer: PRIVATE HEALTH INSURANCE

## 2022-01-17 ENCOUNTER — Other Ambulatory Visit: Payer: Self-pay | Admitting: Physician Assistant

## 2022-01-17 DIAGNOSIS — F320 Major depressive disorder, single episode, mild: Secondary | ICD-10-CM

## 2022-01-17 LAB — COMPREHENSIVE METABOLIC PANEL
ALT: 12 U/L (ref 0–35)
AST: 15 U/L (ref 0–37)
Albumin: 4.3 g/dL (ref 3.5–5.2)
Alkaline Phosphatase: 63 U/L (ref 39–117)
BUN: 12 mg/dL (ref 6–23)
CO2: 24 mEq/L (ref 19–32)
Calcium: 9.9 mg/dL (ref 8.4–10.5)
Chloride: 106 mEq/L (ref 96–112)
Creatinine, Ser: 0.81 mg/dL (ref 0.40–1.20)
GFR: 85.74 mL/min (ref >60.00)
Glucose, Bld: 84 mg/dL (ref 70–99)
Potassium: 5.1 mEq/L (ref 3.5–5.1)
Sodium: 141 mEq/L (ref 135–145)
Total Bilirubin: 0.5 mg/dL (ref 0.2–1.2)
Total Protein: 7.2 g/dL (ref 6.0–8.3)

## 2022-04-03 ENCOUNTER — Ambulatory Visit (INDEPENDENT_AMBULATORY_CARE_PROVIDER_SITE_OTHER): Payer: PRIVATE HEALTH INSURANCE | Admitting: Nurse Practitioner

## 2022-04-03 ENCOUNTER — Other Ambulatory Visit (HOSPITAL_COMMUNITY)
Admission: RE | Admit: 2022-04-03 | Discharge: 2022-04-03 | Disposition: A | Discharge status: Home / Self Care | Payer: PRIVATE HEALTH INSURANCE | Source: Ambulatory Visit | Attending: Nurse Practitioner | Admitting: Nurse Practitioner

## 2022-04-03 ENCOUNTER — Encounter: Payer: Self-pay | Admitting: Nurse Practitioner

## 2022-04-03 VITALS — BP 118/80 | HR 70 | Ht 64.5 in | Wt 199.0 lb

## 2022-04-03 DIAGNOSIS — N898 Other specified noninflammatory disorders of vagina: Secondary | ICD-10-CM | POA: Diagnosis not present

## 2022-04-03 DIAGNOSIS — Z01419 Encounter for gynecological examination (general) (routine) without abnormal findings: Secondary | ICD-10-CM

## 2022-04-03 DIAGNOSIS — Z30431 Encounter for routine checking of intrauterine contraceptive device: Secondary | ICD-10-CM

## 2022-04-03 DIAGNOSIS — B009 Herpesviral infection, unspecified: Secondary | ICD-10-CM | POA: Diagnosis not present

## 2022-04-03 LAB — WET PREP FOR TRICH, YEAST, CLUE

## 2022-04-03 NOTE — Progress Notes (Signed)
   Mackenzie Meyers 1971/07/15 [id number]   History:  51 y.o. [id number] presents for annual exam. Mirena IUD 03/2020, amenorrheic. Cryosurgery at age 89, 2016 normal cytology + HR HPV. H/O left renal cell carcinoma, HSV - Valtrex as needed. Reports history of recurrent yeast infections. Vaginal odor today.   Gynecologic History No LMP recorded. (Menstrual status: IUD).   Contraception/Family planning: IUD Sexually active: Yes  Health Maintenance Last Pap: 10/28/2018. Results were: Normal Last mammogram: 01/16/2021. Results were: Normal Last colonoscopy: 09/13/2021. Results were: Normal, 10-year recall Last Dexa: Not indicated  Past medical history, past surgical history, family history and social history were all reviewed and documented in the EPIC chart.  ROS:  A ROS was performed and pertinent positives and negatives are included.  Exam:  Vitals:   04/03/22 1459  BP: 118/80  Pulse: 70  SpO2: 96%  Weight: 199 lb (90.3 kg)  Height: 5' 4.5" (1.638 m)   Body mass index is 33.63 kg/m.  General appearance:  Normal Thyroid:  Symmetrical, normal in size, without palpable masses or nodularity. Respiratory  Auscultation:  Clear without wheezing or rhonchi Cardiovascular  Auscultation:  Regular rate, without rubs, murmurs or gallops  Edema/varicosities:  Not grossly evident Abdominal  Soft,nontender, without masses, guarding or rebound.  Liver/spleen:  No organomegaly noted  Hernia:  None appreciated  Skin  Inspection:  Grossly normal Breasts: Examined lying and sitting.   Right: Without masses, retractions, nipple discharge or axillary adenopathy.   Left: Without masses, retractions, nipple discharge or axillary adenopathy. Genitourinary   Inguinal/mons:  Normal without inguinal adenopathy  External genitalia:  Normal appearing vulva with no masses, tenderness, or lesions  BUS/Urethra/Skene's glands:  Normal  Vagina:  Normal appearing with normal color and discharge, no  lesions  Cervix:  Normal appearing without discharge or lesions. IUD string visible  Uterus:  Normal in size, shape and contour.  Midline and mobile, nontender  Adnexa/parametria:     Rt: Normal in size, without masses or tenderness.   Lt: Normal in size, without masses or tenderness.  Anus and perineum: Normal  Digital rectal exam: Normal sphincter tone without palpated masses or tenderness  Patient informed chaperone available to be present for breast and pelvic exam. Patient has requested no chaperone to be present. Patient has been advised what will be completed during breast and pelvic exam.   Wet prep negative  Assessment/Plan:  51 y.o. [id number] for annual exam.   Well female exam with routine gynecological exam - Plan: Cytology - PAP( [facility]). Education provided on SBEs, importance of preventative screenings, current guidelines, high calcium diet, regular exercise, and multivitamin daily.  Labs with PCP.   Encounter for routine checking of intrauterine contraceptive device (IUD) - Mirena IUD 03/2020, amenorrheic. Aware of 8-year FDA approval.   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   Vaginal odor - Plan: WET PREP FOR [place], YEAST, CLUE. Negative wet prep  Screening for cervical cancer - Cryosurgery at age 51, 2016 normal cytology + HR HPV.  Pap today.   Screening for breast cancer - Normal mammogram history. Overdue and plans to schedule soon. Normal breast exam today.  Screening for colon cancer - 08/2021 colonoscopy. Will repeat at 10-year interval per GI's recommendation.   Return in 1 year for annual.     Tamela Gammon DNP, 4:07 PM 04/03/2022

## 2022-04-05 LAB — CYTOLOGY - PAP
Comment: NEGATIVE
Diagnosis: NEGATIVE
High risk HPV: NEGATIVE

## 2023-01-07 ENCOUNTER — Encounter: Payer: Self-pay | Admitting: Physician Assistant

## 2024-05-10 ENCOUNTER — Ambulatory Visit (INDEPENDENT_AMBULATORY_CARE_PROVIDER_SITE_OTHER): Payer: PRIVATE HEALTH INSURANCE | Admitting: Podiatry

## 2024-05-10 ENCOUNTER — Ambulatory Visit (INDEPENDENT_AMBULATORY_CARE_PROVIDER_SITE_OTHER): Payer: PRIVATE HEALTH INSURANCE

## 2024-05-10 ENCOUNTER — Ambulatory Visit: Payer: PRIVATE HEALTH INSURANCE

## 2024-05-10 DIAGNOSIS — M7751 Other enthesopathy of right foot: Secondary | ICD-10-CM

## 2024-05-10 DIAGNOSIS — M7752 Other enthesopathy of left foot: Secondary | ICD-10-CM

## 2024-05-10 DIAGNOSIS — M2022 Hallux rigidus, left foot: Secondary | ICD-10-CM

## 2024-05-10 DIAGNOSIS — M2021 Hallux rigidus, right foot: Secondary | ICD-10-CM

## 2024-05-10 NOTE — Progress Notes (Signed)
 Chief Complaint  Patient presents with   Foot Pain    Right foot: previous bunionectomy on 1st toe. Now has a popping and painful sensation ongoing for 6 months. Surgery was performed by Dr. Christine.  Left Foot: swelling on top of foot between 1st and 2nd. Feels like it was hit with a hammer. Ongoing for at least 6 months. No previous surgical interventions.  Not diabetic. Not on anticoag therapy.     HPI: 53 y.o. female presenting today as a new patient with our practice for evaluation of bilateral great toe pain.  History of bunionectomy with osteotomy to the right foot several years prior.  Over the course of the last several months she has had gradual onset of pain with audible clicking sensation to the bilateral great toe joints.  She has tried different conservative modalities including anti-inflammatory medication, shoe gear modifications, with no lasting alleviation of her symptoms.  She wants to be active but it is affecting her daily quality of life  Past Medical History:  Diagnosis Date   Anxiety    currently managed on CBD oil, was on medication briefly but made her OCD worse   Arthritis    hands, DDD, and back 12-2017   Breast mass, right 09/2012   removed because it was painful, was found to be calcified scar tissue   Cancer (HCC)    left renal mass   Cervical dysplasia age 11   Complication of anesthesia    Cries coming out of anesthesia    Family history of adverse reaction to anesthesia    Maternal grandmother PONV   Geographical tongue    Gestational diabetes    gestational   Heart murmur    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   History of kidney stones 10/2011   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   Normal cytology   Hx of migraines    OCD (obsessive compulsive disorder)    cleanliness with home and workspace   Rosacea    Sinus congestion 10/12/2012   Sleep apnea 04/2017   patient is wearing C-pap   Tobacco abuse 1989   max was  1.5 packs per [place]   Vitamin D  deficiency 10/2013   Value 29   Vitiligo     Past Surgical History:  Procedure Laterality Date   ABDOMINOPLASTY  2008   BREAST BIOPSY Right 10/15/2012   Procedure: BREAST BIOPSY;  Surgeon: Sherlean JINNY Laughter, MD;  Location: [facility];  Service: General;  Laterality: Right;  removal right breast mass   BREAST EXCISIONAL BIOPSY Right    BREAST SURGERY     Lift   CESAREAN SECTION  2004, 2007   CESAREAN SECTION N/A 02/07/2015   Procedure: CESAREAN SECTION;  Surgeon: Jolene Gaskins, MD;  Location: [facility];  Service: Obstetrics;  Laterality: N/A;  EDD: 02/13/15    dialation and cutterage     FOOT SURGERY Right 06/19/2021   GYNECOLOGIC CRYOSURGERY  age 66   HERNIA REPAIR  03/03/2019   INTRAUTERINE DEVICE INSERTION  03/2015   Mirena  at [facility]   MICRODISCECTOMY LUMBAR     12-2017    REDUCTION MAMMAPLASTY Bilateral    ROBOTIC ASSITED PARTIAL NEPHRECTOMY Left 09/17/2018   Procedure: XI ROBOTIC ASSITED PARTIAL NEPHRECTOMY;  Surgeon: Sherrilee Belvie CROME, MD;  Location: [facility];  Service: Urology;  Laterality: Left;   TONSILLECTOMY  2000    Allergies  Allergen Reactions   Ampicillin Hives    Can  tolerate amoxicillin  and penicillin   Dust Mite Extract Cough   Erythromycin Nausea And Vomiting   Mold Extract [Trichophyton] Cough   Other Hives    SAUERKRAUT   Nyquil Hbp Cold & Flu [Dm-Doxylamine-Acetaminophen ]     Flushing, body temp increases   Wine [Alcohol] Hives   Ciprofloxacin  Hives and Rash     Physical Exam: General: The patient is alert and oriented x3 in no acute distress.  Dermatology: Skin is warm, dry and supple bilateral lower extremities.   Vascular: Palpable pedal pulses bilaterally. Capillary refill within normal limits.  No appreciable edema.  No erythema.  Neurological: Grossly intact via light touch  Musculoskeletal Exam: Severe pain and tenderness with palpation range of motion of first MTP bilateral.   Crepitus also noted with motion to the first MTP bilateral.  Findings correlate radiographically with advanced arthritis to the great toe joints.  There is also limited range of motion of the first MTP bilateral  Radiographic Exam B/L feet 05/10/2024:  Advanced severe joint space narrowing with degenerative changes noted to the first MTP bilateral; RT > LT. no acute fractures identified  Assessment/Plan of Care: 1.  Hallux rigidus w/ advanced arthritis first MTP bilateral  -Patient evaluated.  X-rays reviewed -Unfortunately patient has tried and failed multiple conservative modalities to help alleviate her symptoms and pain.  She has pain affecting her on a daily basis. -She is unable to receive cortisone injections secondary to complications -Today we discussed surgery to the bilateral great toe joints which would include great toe arthroplasty with Silastic double stem implant.  Risk benefits advantages and disadvantages as well as the postoperative recovery course were explained in length in detail to the patient.  No guarantees were expressed or implied.  The patient opts for surgical management -Authorization for surgery was initiated today.  Surgery will consist of bilateral great toe arthroplasty with Silastic double stem implant.  Removal of orthopedic K wire fixation first metatarsal right.  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  Planning to take minimum 2 weeks off of work with accommodations when she returns       Thresa EMERSON Sar, DPM [facility]  Dr. Thresa EMERSON Sar, DPM    [address] [place], [place] [address]                Office [phone]  Fax [phone]

## 2024-08-10 ENCOUNTER — Telehealth: Payer: Self-pay | Admitting: Podiatry

## 2024-08-10 NOTE — Telephone Encounter (Signed)
 Patient called in asking if we could provide letter for her husband as he has been summoned for jury duty on patient's surgery date. Patient's primary caregiver at home is her husband and they are requesting a letter from provider with surgery date to see if husband can be excused. Can we provide one? Patient does want it uploaded to MyChart.

## 2024-08-10 NOTE — Telephone Encounter (Signed)
 Patient scheduled for surgery on 09/02/2024. Patient not on any GLP1 or blood thinners. Patient pharmacy correct in chart.

## 2024-08-24 ENCOUNTER — Encounter: Payer: Self-pay | Admitting: Podiatry

## 2024-08-24 NOTE — Telephone Encounter (Signed)
 See prev notes Spouse Nancyann Kettlewell needs note to adv DOS 09/02/2024 he needs to be excused from jury duty

## 2024-08-24 NOTE — Telephone Encounter (Signed)
 Letter sent via MyChart per pt's request

## 2024-08-30 ENCOUNTER — Telehealth: Payer: Self-pay | Admitting: Podiatry

## 2024-08-30 NOTE — Telephone Encounter (Signed)
 DOS- 09/02/2024  KELLER BUNION IMPLANT BIL- [id number]  [employer] EFFECTIVE DATE- 08/26/2024  DEDUCTIBLE- $3300 REMAINING- $3300 OOP- $4000 REMAINING- $4000 FAMILY DEDUCTIBLE- $5400 REMAINING- $5400 FAMILY OOP- $8000 REMAINING- $8000  PER TERESA WITH [employer], PRIOR AUTH IS NOT REQUIRED FOR CPT CODE [phone]. REF# TERESA S. 08/30/2024

## 2024-09-02 ENCOUNTER — Other Ambulatory Visit: Payer: Self-pay | Admitting: Podiatry

## 2024-09-02 DIAGNOSIS — M2021 Hallux rigidus, right foot: Secondary | ICD-10-CM | POA: Diagnosis not present

## 2024-09-02 DIAGNOSIS — M2022 Hallux rigidus, left foot: Secondary | ICD-10-CM | POA: Diagnosis not present

## 2024-09-02 MED ORDER — HYDROMORPHONE HCL 4 MG PO TABS: 4.0000 mg | ORAL_TABLET | ORAL | 0 refills | Status: DC | PRN

## 2024-09-02 NOTE — Progress Notes (Signed)
 PRN postop

## 2024-09-03 ENCOUNTER — Encounter (HOSPITAL_COMMUNITY): Payer: Self-pay

## 2024-09-03 ENCOUNTER — Emergency Department (HOSPITAL_COMMUNITY): Payer: PRIVATE HEALTH INSURANCE

## 2024-09-03 ENCOUNTER — Telehealth: Payer: Self-pay | Admitting: Lab

## 2024-09-03 ENCOUNTER — Inpatient Hospital Stay (HOSPITAL_COMMUNITY)
Admission: EM | Admit: 2024-09-03 | Discharge: 2024-09-05 | DRG: 862 | Disposition: A | Discharge status: Home / Self Care | Payer: PRIVATE HEALTH INSURANCE | Attending: Internal Medicine | Admitting: Internal Medicine

## 2024-09-03 ENCOUNTER — Other Ambulatory Visit: Payer: Self-pay

## 2024-09-03 DIAGNOSIS — Z9109 Other allergy status, other than to drugs and biological substances: Secondary | ICD-10-CM

## 2024-09-03 DIAGNOSIS — Z975 Presence of (intrauterine) contraceptive device: Secondary | ICD-10-CM

## 2024-09-03 DIAGNOSIS — Z801 Family history of malignant neoplasm of trachea, bronchus and lung: Secondary | ICD-10-CM

## 2024-09-03 DIAGNOSIS — Z905 Acquired absence of kidney: Secondary | ICD-10-CM

## 2024-09-03 DIAGNOSIS — Z6834 Body mass index (BMI) 34.0-34.9, adult: Secondary | ICD-10-CM

## 2024-09-03 DIAGNOSIS — Z1152 Encounter for screening for COVID-19: Secondary | ICD-10-CM

## 2024-09-03 DIAGNOSIS — Z91018 Allergy to other foods: Secondary | ICD-10-CM

## 2024-09-03 DIAGNOSIS — Z83438 Family history of other disorder of lipoprotein metabolism and other lipidemia: Secondary | ICD-10-CM

## 2024-09-03 DIAGNOSIS — E669 Obesity, unspecified: Secondary | ICD-10-CM | POA: Diagnosis present

## 2024-09-03 DIAGNOSIS — F17210 Nicotine dependence, cigarettes, uncomplicated: Secondary | ICD-10-CM | POA: Diagnosis present

## 2024-09-03 DIAGNOSIS — M3509 Sicca syndrome with other organ involvement: Secondary | ICD-10-CM | POA: Insufficient documentation

## 2024-09-03 DIAGNOSIS — Z825 Family history of asthma and other chronic lower respiratory diseases: Secondary | ICD-10-CM

## 2024-09-03 DIAGNOSIS — Y838 Other surgical procedures as the cause of abnormal reaction of the patient, or of later complication, without mention of misadventure at the time of the procedure: Secondary | ICD-10-CM | POA: Diagnosis present

## 2024-09-03 DIAGNOSIS — Z8261 Family history of arthritis: Secondary | ICD-10-CM

## 2024-09-03 DIAGNOSIS — T8144XA Sepsis following a procedure, initial encounter: Principal | ICD-10-CM | POA: Diagnosis present

## 2024-09-03 DIAGNOSIS — M19079 Primary osteoarthritis, unspecified ankle and foot: Secondary | ICD-10-CM

## 2024-09-03 DIAGNOSIS — Z822 Family history of deafness and hearing loss: Secondary | ICD-10-CM

## 2024-09-03 DIAGNOSIS — A419 Sepsis, unspecified organism: Secondary | ICD-10-CM | POA: Diagnosis not present

## 2024-09-03 DIAGNOSIS — Z888 Allergy status to other drugs, medicaments and biological substances status: Secondary | ICD-10-CM

## 2024-09-03 DIAGNOSIS — G4733 Obstructive sleep apnea (adult) (pediatric): Secondary | ICD-10-CM | POA: Diagnosis present

## 2024-09-03 DIAGNOSIS — R5082 Postprocedural fever: Principal | ICD-10-CM

## 2024-09-03 DIAGNOSIS — Z791 Long term (current) use of non-steroidal anti-inflammatories (NSAID): Secondary | ICD-10-CM

## 2024-09-03 DIAGNOSIS — F429 Obsessive-compulsive disorder, unspecified: Secondary | ICD-10-CM | POA: Diagnosis present

## 2024-09-03 DIAGNOSIS — Z8632 Personal history of gestational diabetes: Secondary | ICD-10-CM

## 2024-09-03 DIAGNOSIS — Z8249 Family history of ischemic heart disease and other diseases of the circulatory system: Secondary | ICD-10-CM

## 2024-09-03 DIAGNOSIS — Z87442 Personal history of urinary calculi: Secondary | ICD-10-CM

## 2024-09-03 DIAGNOSIS — Z9989 Dependence on other enabling machines and devices: Secondary | ICD-10-CM

## 2024-09-03 DIAGNOSIS — F17290 Nicotine dependence, other tobacco product, uncomplicated: Secondary | ICD-10-CM | POA: Diagnosis present

## 2024-09-03 DIAGNOSIS — F419 Anxiety disorder, unspecified: Secondary | ICD-10-CM | POA: Diagnosis present

## 2024-09-03 DIAGNOSIS — M19071 Primary osteoarthritis, right ankle and foot: Secondary | ICD-10-CM | POA: Diagnosis present

## 2024-09-03 DIAGNOSIS — Z79899 Other long term (current) drug therapy: Secondary | ICD-10-CM

## 2024-09-03 DIAGNOSIS — Z833 Family history of diabetes mellitus: Secondary | ICD-10-CM

## 2024-09-03 DIAGNOSIS — Z720 Tobacco use: Secondary | ICD-10-CM | POA: Diagnosis present

## 2024-09-03 DIAGNOSIS — Z87410 Personal history of cervical dysplasia: Secondary | ICD-10-CM

## 2024-09-03 DIAGNOSIS — Z807 Family history of other malignant neoplasms of lymphoid, hematopoietic and related tissues: Secondary | ICD-10-CM

## 2024-09-03 DIAGNOSIS — M3500 Sicca syndrome, unspecified: Secondary | ICD-10-CM | POA: Diagnosis present

## 2024-09-03 DIAGNOSIS — M19072 Primary osteoarthritis, left ankle and foot: Secondary | ICD-10-CM | POA: Diagnosis present

## 2024-09-03 DIAGNOSIS — Z881 Allergy status to other antibiotic agents status: Secondary | ICD-10-CM

## 2024-09-03 LAB — COMPREHENSIVE METABOLIC PANEL WITH GFR
ALT: 16 U/L (ref 0–44)
AST: 24 U/L (ref 15–41)
Albumin: 4.2 g/dL (ref 3.5–5.0)
Alkaline Phosphatase: 82 U/L (ref 38–126)
Anion gap: 11 (ref 5–15)
BUN: 12 mg/dL (ref 6–20)
CO2: 25 mmol/L (ref 22–32)
Calcium: 9.5 mg/dL (ref 8.9–10.3)
Chloride: 102 mmol/L (ref 98–111)
Creatinine, Ser: 0.72 mg/dL (ref 0.44–1.00)
GFR, Estimated: >60 mL/min
Glucose, Bld: 170 mg/dL — ABNORMAL HIGH (ref 70–99)
Potassium: 3.8 mmol/L (ref 3.5–5.1)
Sodium: 137 mmol/L (ref 135–145)
Total Bilirubin: 0.6 mg/dL (ref 0.0–1.2)
Total Protein: 7.4 g/dL (ref 6.5–8.1)

## 2024-09-03 LAB — I-STAT CG4 LACTIC ACID, ED: Lactic Acid, Venous: 1 mmol/L (ref 0.5–1.9)

## 2024-09-03 LAB — CBC WITH DIFFERENTIAL/PLATELET
Abs Immature Granulocytes: 0.04 K/uL (ref 0.00–0.07)
Basophils Absolute: 0.1 K/uL (ref 0.0–0.1)
Basophils Relative: 1 %
Eosinophils Absolute: 0.1 K/uL (ref 0.0–0.5)
Eosinophils Relative: 1 %
HCT: 43 % (ref 36.0–46.0)
Hemoglobin: 14 g/dL (ref 12.0–15.0)
Immature Granulocytes: 0 %
Lymphocytes Relative: 18 %
Lymphs Abs: 2.1 K/uL (ref 0.7–4.0)
MCH: 28.6 pg (ref 26.0–34.0)
MCHC: 32.6 g/dL (ref 30.0–36.0)
MCV: 87.8 fL (ref 80.0–100.0)
Monocytes Absolute: 1.2 K/uL — ABNORMAL HIGH (ref 0.1–1.0)
Monocytes Relative: 10 %
Neutro Abs: 8.2 K/uL — ABNORMAL HIGH (ref 1.7–7.7)
Neutrophils Relative %: 70 %
Platelets: 287 K/uL (ref 150–400)
RBC: 4.9 MIL/uL (ref 3.87–5.11)
RDW: 13.8 % (ref 11.5–15.5)
WBC: 11.7 K/uL — ABNORMAL HIGH (ref 4.0–10.5)
nRBC: 0 % (ref 0.0–0.2)

## 2024-09-03 LAB — URINALYSIS, W/ REFLEX TO CULTURE (INFECTION SUSPECTED)
Bilirubin Urine: NEGATIVE
Glucose, UA: NEGATIVE mg/dL
Hgb urine dipstick: NEGATIVE
Ketones, ur: NEGATIVE mg/dL
Leukocytes,Ua: NEGATIVE
Nitrite: NEGATIVE
Protein, ur: NEGATIVE mg/dL
Specific Gravity, Urine: 1.015 (ref 1.005–1.030)
pH: 5 (ref 5.0–8.0)

## 2024-09-03 LAB — PROTIME-INR
INR: 0.9 (ref 0.8–1.2)
Prothrombin Time: 12.6 s (ref 11.4–15.2)

## 2024-09-03 LAB — RESP PANEL BY RT-PCR (RSV, FLU A&B, COVID)  RVPGX2
Influenza A by PCR: NEGATIVE
Influenza B by PCR: NEGATIVE
Resp Syncytial Virus by PCR: NEGATIVE
SARS Coronavirus 2 by RT PCR: NEGATIVE

## 2024-09-03 LAB — PROCALCITONIN: Procalcitonin: <0.1 ng/mL

## 2024-09-03 LAB — SEDIMENTATION RATE: Sed Rate: 18 mm/h (ref 0–22)

## 2024-09-03 MED ORDER — ACETAMINOPHEN 325 MG PO TABS
650.0000 mg | ORAL_TABLET | Freq: Four times a day (QID) | ORAL | Status: DC | PRN
  Administered 2024-09-04: 650 mg via ORAL
  Filled 2024-09-03: qty 2

## 2024-09-03 MED ORDER — HYDROCODONE-ACETAMINOPHEN 5-325 MG PO TABS
1.0000 | ORAL_TABLET | Freq: Four times a day (QID) | ORAL | Status: DC | PRN
  Administered 2024-09-04: 2 via ORAL
  Administered 2024-09-04: 1 via ORAL
  Filled 2024-09-03: qty 1
  Filled 2024-09-03: qty 2

## 2024-09-03 MED ORDER — SODIUM CHLORIDE 0.9 % IV SOLN
1.0000 g | INTRAVENOUS | Status: DC
  Administered 2024-09-04: 1 g via INTRAVENOUS
  Filled 2024-09-03 (×2): qty 10

## 2024-09-03 MED ORDER — SODIUM CHLORIDE 0.9 % IV BOLUS
1000.0000 mL | Freq: Once | INTRAVENOUS | Status: AC
  Administered 2024-09-03: 1000 mL via INTRAVENOUS

## 2024-09-03 MED ORDER — MORPHINE SULFATE (PF) 2 MG/ML IV SOLN
2.0000 mg | Freq: Once | INTRAVENOUS | Status: AC
  Administered 2024-09-03: 2 mg via INTRAVENOUS

## 2024-09-03 MED ORDER — HYDROMORPHONE HCL 1 MG/ML IJ SOLN
0.5000 mg | INTRAMUSCULAR | Status: DC | PRN
  Administered 2024-09-03 – 2024-09-05 (×7): 0.5 mg via INTRAVENOUS
  Filled 2024-09-03 (×6): qty 0.5
  Filled 2024-09-03: qty 1

## 2024-09-03 MED ORDER — SODIUM CHLORIDE 0.9 % IV SOLN
1.0000 g | Freq: Once | INTRAVENOUS | Status: AC
  Administered 2024-09-03: 1 g via INTRAVENOUS
  Filled 2024-09-03: qty 10

## 2024-09-03 MED ORDER — ONDANSETRON HCL 4 MG PO TABS: 4.0000 mg | ORAL_TABLET | Freq: Four times a day (QID) | ORAL | Status: DC | PRN

## 2024-09-03 MED ORDER — MORPHINE SULFATE (PF) 2 MG/ML IV SOLN
2.0000 mg | INTRAVENOUS | Status: DC | PRN
  Filled 2024-09-03: qty 1

## 2024-09-03 MED ORDER — VANCOMYCIN HCL 1750 MG/350ML IV SOLN
1750.0000 mg | Freq: Once | INTRAVENOUS | Status: AC
  Administered 2024-09-03: 1750 mg via INTRAVENOUS
  Filled 2024-09-03: qty 350

## 2024-09-03 MED ORDER — ONDANSETRON HCL 4 MG/2ML IJ SOLN
4.0000 mg | Freq: Four times a day (QID) | INTRAMUSCULAR | Status: DC | PRN
  Administered 2024-09-03: 4 mg via INTRAVENOUS
  Filled 2024-09-03: qty 2

## 2024-09-03 MED ORDER — ACETAMINOPHEN 650 MG RE SUPP: 650.0000 mg | Freq: Four times a day (QID) | RECTAL | Status: DC | PRN

## 2024-09-03 MED ORDER — SODIUM CHLORIDE 0.9 % IV SOLN: INTRAVENOUS | Status: AC

## 2024-09-03 MED ORDER — VANCOMYCIN HCL 2000 MG/400ML IV SOLN
2000.0000 mg | INTRAVENOUS | Status: DC
  Administered 2024-09-04: 2000 mg via INTRAVENOUS
  Filled 2024-09-03: qty 400

## 2024-09-03 MED ORDER — HYDROMORPHONE HCL 1 MG/ML IJ SOLN
0.5000 mg | Freq: Once | INTRAMUSCULAR | Status: AC
  Administered 2024-09-03: 0.5 mg via INTRAVENOUS
  Filled 2024-09-03: qty 1

## 2024-09-03 NOTE — ED Notes (Signed)
 Pt assisted to restroom via wheelchair by primary RN.  Urine specimen collected and sent to lab via tube station.  Pt assisted back to bed.  VS obtained. PT concerned fever returned, oral temperature obtained and observed to 98.4.  ED MD at bedside as well as spouse.  Update on plan of care provided.  Labs collected per order and sent to lab via tube station.  Pt sitting on side of bed currently.  No respiratory distress noted.

## 2024-09-03 NOTE — Assessment & Plan Note (Signed)
"  Cpap nightly   "

## 2024-09-03 NOTE — Assessment & Plan Note (Signed)
 Vapes, declines any nicotine  patch

## 2024-09-03 NOTE — Telephone Encounter (Signed)
 Patient left message had surgery 09/03/2023 with Dr.Evans and now has fever 102.5 please call patient with advise.

## 2024-09-03 NOTE — Progress Notes (Signed)
 Pharmacy Antibiotic Note  Mackenzie Meyers is a 54 y.o. female admitted on 09/03/2024 with wound infection.  Pharmacy has been consulted for vancomycin  dosing.  Plan: Vancomycin  2000mg  IV q24h (AUC 480.4, Scr used 0.8) Follow renal function and clinical course   Height: 5' 4 (162.6 cm) Weight: 90.3 kg (199 lb 1.2 oz) IBW/kg (Calculated) : 54.7  Temp (24hrs), Avg:98.9 F (37.2 C), Min:98.3 F (36.8 C), Max:100.1 F (37.8 C)  Recent Labs  Lab 09/03/24 1610 09/03/24 1701  WBC 11.7*  --   CREATININE 0.72  --   LATICACIDVEN  --  1.0    Estimated Creatinine Clearance: 90.5 mL/min (by C-G formula based on SCr of 0.72 mg/dL).    Allergies[1]  Antimicrobials this admission: 1/9 vanc >> 1/9 CTX >>  Dose adjustments this admission:   Microbiology results: 1/9 BCx:  1/9 UCx:   Thank you for allowing pharmacy to be a part of this patients care.  Leeroy Mace RPh 09/03/2024, 10:55 PM     [1]  Allergies Allergen Reactions   Ampicillin Hives    Can tolerate amoxicillin  and penicillin   Dust Mite Extract Cough   Erythromycin Nausea And Vomiting   Mold Extract [Trichophyton] Cough   Other Hives    SAUERKRAUT   Nyquil Hbp Cold & Flu [Dm-Doxylamine-Acetaminophen ]     Flushing, body temp increases   Wine [Alcohol] Hives   Ciprofloxacin  Hives and Rash

## 2024-09-03 NOTE — ED Provider Notes (Signed)
 " [place] EMERGENCY DEPARTMENT AT [facility] Provider Note   CSN: [id number] Arrival date & time: 09/03/24  1551     Patient presents with: Post-op Problem   Mackenzie Meyers is a 54 y.o. female.   The patient is a 54 year old who presents with fever and severe pain following bilateral big toe surgery.  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 Since surgery she developed fever to 102.3F at home with shivering and severe electric shock-like pain in both great toes. Dilaudid , ibuprofen , and Tylenol  give only brief relief of about two hours. She denies upper respiratory symptoms, nausea, or vomiting. She is eating and drinking adequately. She has not had a bowel movement since surgery and is using Smooth Move Tea to prevent constipation. No URI symptoms.   The history is provided by the patient and the spouse.       Prior to Admission medications  Medication Sig Start Date End Date Taking? Authorizing Provider  acetaminophen  (TYLENOL ) 500 MG tablet Take 500 mg by mouth every 6 (six) hours as needed.    [provider]  Cyanocobalamin  (VITAMIN B-12 PO) Take by mouth.    [provider]  diazepam  (VALIUM ) 5 MG tablet Take 1 tablet (5 mg total) by mouth every 12 (twelve) hours as needed for muscle spasms. 01/14/22   Job Lukes, PA  FLUoxetine  (PROZAC ) 20 MG capsule Take 20 mg by mouth daily. 02/07/22   [provider]  HYDROmorphone  (DILAUDID ) 4 MG tablet Take 1 tablet (4 mg total) by mouth every 4 (four) hours as needed for severe pain (pain score 7-10). 09/02/24   Tobie Franky SQUIBB, DPM  levonorgestrel  (MIRENA ) 20 MCG/24HR IUD 1 each by Intrauterine route once. Inserted by GYN on 03/2015 needs to be removed 03/2020.    [provider]  MAGNESIUM  GLYCINATE PO Take by mouth.    [provider]  meloxicam  (MOBIC ) 15 MG tablet Take 7.5 mg by mouth 2 (two) times daily.    [provider]  valACYclovir  (VALTREX ) 500 MG tablet Take 1 tablet (500 mg total) by mouth daily. 07/30/21   Job Lukes, PA  VITAMIN D  PO Take by mouth.    [provider]    Allergies: Ampicillin, Dust mite extract, Erythromycin, Mold extract [trichophyton], Other, Nyquil hbp cold & flu [dm-doxylamine-acetaminophen ], Wine [alcohol], and Ciprofloxacin     Review of Systems  Constitutional:  Positive for chills and fever. Negative for activity change and appetite change.  HENT:  Negative for congestion, postnasal drip, sinus pressure and sinus pain.   Respiratory:  Negative for shortness of breath.   Cardiovascular:  Negative for chest pain and leg swelling.  Gastrointestinal:  Positive for constipation. Negative for abdominal distention, abdominal pain, nausea and vomiting.  Genitourinary:  Negative for difficulty urinating.    Updated Vital Signs BP 114/62   Pulse 64   Temp 98.3 F (36.8 C) (Oral)   Resp 17   Ht 5' 4 (1.626 m)   Wt 90.3 kg   SpO2 96%   BMI 34.17 kg/m   Physical Exam Constitutional:      General: She is not in acute distress.    Appearance: Normal appearance. She is normal weight. She is ill-appearing.  Cardiovascular:     Rate and Rhythm: Regular rhythm. Tachycardia present.     Pulses: Normal pulses.     Heart sounds: Normal heart sounds. No murmur heard. Pulmonary:  Effort: Pulmonary effort is normal. No respiratory distress.     Breath sounds: Normal breath sounds.  Abdominal:     General: Abdomen is flat. Bowel sounds are normal. There is no distension.     Palpations: Abdomen is soft.     Tenderness: There is no abdominal tenderness.  Skin:    General: Skin is warm.     Capillary Refill: Capillary refill takes less than 2 seconds.     Comments: Incisions on bilateral MTP clean, dry, without purulent drainage   Neurological:     General: No focal deficit present.     Mental Status: She is alert and oriented to person, place, and  time. Mental status is at baseline.     (all labs ordered are listed, but only abnormal results are displayed) Labs Reviewed  COMPREHENSIVE METABOLIC PANEL WITH GFR - Abnormal; Notable for the following components:      Result Value   Glucose, Bld 170 (*)    All other components within normal limits  CBC WITH DIFFERENTIAL/PLATELET - Abnormal; Notable for the following components:   WBC 11.7 (*)    Neutro Abs 8.2 (*)    Monocytes Absolute 1.2 (*)    All other components within normal limits  URINALYSIS, W/ REFLEX TO CULTURE (INFECTION SUSPECTED) - Abnormal; Notable for the following components:   APPearance HAZY (*)    Bacteria, UA MANY (*)    All other components within normal limits  RESP PANEL BY RT-PCR (RSV, FLU A&B, COVID)  RVPGX2  CULTURE, BLOOD (ROUTINE X 2)  CULTURE, BLOOD (ROUTINE X 2)  URINE CULTURE  PROTIME-INR  PROCALCITONIN  PROCALCITONIN  SEDIMENTATION RATE  C-REACTIVE PROTEIN  I-STAT CG4 LACTIC ACID, ED    EKG: None  Radiology: DG Chest 1 View Result Date: 09/03/2024 CLINICAL DATA:  Fever. EXAM: CHEST  1 VIEW COMPARISON:  September 19, 2018 FINDINGS: The heart size and mediastinal contours are within normal limits. Low lung volumes are noted. Mild linear atelectasis and/or infiltrate is seen within the left lung base. No pleural effusion or pneumothorax is identified. The visualized skeletal structures are unremarkable. IMPRESSION: Low lung volumes with mild left basilar linear atelectasis and/or infiltrate. Electronically Signed   By: Suzen Dials M.D.   On: 09/03/2024 18:17     Procedures   Medications Ordered in the ED  morphine  (PF) 2 MG/ML injection 2 mg (has no administration in time range)  HYDROcodone -acetaminophen  (NORCO/VICODIN) 5-325 MG per tablet 1-2 tablet (has no administration in time range)  HYDROmorphone  (DILAUDID ) injection 0.5 mg (0.5 mg Intravenous Given 09/03/24 1746)  sodium chloride  0.9 % bolus 1,000 mL (0 mLs Intravenous Stopped  09/03/24 2018)  cefTRIAXone  (ROCEPHIN ) 1 g in sodium chloride  0.9 % 100 mL IVPB (0 g Intravenous Stopped 09/03/24 1900)  vancomycin  (VANCOREADY) IVPB 1750 mg/350 mL (0 mg Intravenous Stopped 09/03/24 2031)                                    Medical Decision Making Avacyn D Wolfgang is a 54 y.o. female presenting for new onset fever and severe post-op pain from surgery 1/8. On presentation vital signs significant for tachycardia, temp 100.1. On exam she is ill appearing, lower extremities warm, no pitting edema, dressings removed and showed well healing incisions bilaterally. Lab work significant for lactic acid 1, leukocytosis 11.7, negative quad screen, negative UA, negative chest x-ray. Obtained blood cultures and started IV CTX  and vancomycin .    Called on call podiatrist Dr. Tobie, he will see tomorrow morning. Plan to admit for IV antibiotics and observation.   Amount and/or Complexity of Data Reviewed Labs: ordered. Radiology: ordered.  Risk Prescription drug management. Decision regarding hospitalization.       Final diagnoses:  Postoperative fever    ED Discharge Orders     None          Cleotilde Perkins, DO 09/03/24 2149  "

## 2024-09-03 NOTE — Assessment & Plan Note (Signed)
 POD #1 Excruciating pain, given oral dilaudid  as morphine  and other pain medication do not work per patient IV dilaudid  here as needed Podiatry consulted  Inflammatory markers, PCT pending

## 2024-09-03 NOTE — Assessment & Plan Note (Addendum)
 54 year old presenting to ED who is POD #1 after bilateral great toe arthroplasty with Silastic double stem implants by podiatry with fever to 102.7 and meeting sepsis criteria with tachycardia.  -obs to med surg -bilateral toes with no erythema or drainage -podiatry consulted -CXR with mild left atelectasis vs. Infiltrate, but no cough, shortness of breath or other upper respiratory finding -urine doesn't appear grossly infected -check inflammatory markers -pan cultured  -lactic acid wnl -trend PCT -continue IVF  -continue vanc and rocephin  for now

## 2024-09-03 NOTE — ED Notes (Signed)
 ED TO INPATIENT HANDOFF REPORT  Name/Age/Gender Mackenzie Meyers 54 y.o. female  Code Status    Code Status Orders  (From admission, onward)           Start     Ordered   09/03/24 2220  Full code  Continuous       Question:  By:  Answer:  Consent: discussion documented in EHR   09/03/24 2220           Code Status History     Date Active Date Inactive Code Status Order ID Comments User Context   09/17/2018 1223 09/20/2018 1825 Full Code [id number]  Cory Alan RIGGERS Inpatient   02/07/2015 1612 02/09/2015 1703 Full Code [id number]  Gretta Gums, MD Inpatient       Home/SNF/Other Home  Chief Complaint Sepsis [facility]) [A41.9]  Level of Care/Admitting Diagnosis ED Disposition     ED Disposition  Admit   Condition  --   Comment  Hospital Area: [facility] [[id number]]  Level of Care: Med-Surg [16]  May place patient in observation at [facility] or [facility] if equivalent level of care is available:: No  Diagnosis: Sepsis [facility]) [[id number]]  Admitting Physician: WADDELL RAKE [[id number]]  Attending Physician: WADDELL RAKE [phone]          Medical History Past Medical History:  Diagnosis Date   Anxiety    currently managed on CBD oil, was on medication briefly but made her OCD worse   Arthritis    hands, DDD, and back 12-2017   Breast mass, right 09/2012   removed because it was painful, was found to be calcified scar tissue   Cancer (HCC)    left renal mass   Cervical dysplasia age 25   Complication of anesthesia    Cries coming out of anesthesia    Family history of adverse reaction to anesthesia    Maternal grandmother PONV   Geographical tongue    Gestational diabetes    gestational   Heart murmur    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   History of kidney stones 10/2011   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   Normal cytology   Hx of migraines    OCD (obsessive compulsive disorder)    cleanliness with  home and workspace   Rosacea    Sinus congestion 10/12/2012   Sleep apnea 04/2017   patient is wearing C-pap   Tobacco abuse 1989   max was 1.5 packs per [place]   Vitamin D  deficiency 10/2013   Value 29   Vitiligo     Allergies Allergies[1]  IV Location/Drains/Wounds Patient Lines/Drains/Airways Status     Active Line/Drains/Airways     Name Placement date Placement time Site Days   Peripheral IV 09/03/24 20 G 1 Anterior;Proximal;Right Forearm 09/03/24  1653  Forearm  less than 1   Peripheral IV 09/03/24 22 G 1 Left;Posterior Hand 09/03/24  1743  Hand  less than 1            Labs/Imaging Results for orders placed or performed during the hospital encounter of 09/03/24 (from the past 48 hours)  Comprehensive metabolic panel     Status: Abnormal   Collection Time: 09/03/24  4:10 PM  Result Value Ref Range   Sodium 137 135 - 145 mmol/L   Potassium 3.8 3.5 - 5.1 mmol/L   Chloride 102 98 - 111 mmol/L   CO2 25 22 - 32 mmol/L   Glucose, Bld 170 (H)  70 - 99 mg/dL    Comment: Glucose reference range applies only to samples taken after fasting for at least 8 hours.   BUN 12 6 - 20 mg/dL   Creatinine, Ser 9.27 0.44 - 1.00 mg/dL   Calcium  9.5 8.9 - 10.3 mg/dL   Total Protein 7.4 6.5 - 8.1 g/dL   Albumin 4.2 3.5 - 5.0 g/dL   AST 24 15 - 41 U/L   ALT 16 0 - 44 U/L   Alkaline Phosphatase 82 38 - 126 U/L   Total Bilirubin 0.6 0.0 - 1.2 mg/dL   GFR, Estimated >39 >39 mL/min    Comment: (NOTE) Calculated using the CKD-EPI Creatinine Equation (2021)    Anion gap 11 5 - 15    Comment: Performed at [facility], [address] [place], [place] [address]  CBC with Differential     Status: Abnormal   Collection Time: 09/03/24  4:10 PM  Result Value Ref Range   WBC 11.7 (H) 4.0 - 10.5 K/uL   RBC 4.90 3.87 - 5.11 MIL/uL   Hemoglobin 14.0 12.0 - 15.0 g/dL   HCT 56.9 63.9 - 53.9 %   MCV 87.8 80.0 - 100.0 fL   MCH 28.6 26.0 - 34.0 pg   MCHC 32.6 30.0 - 36.0 g/dL    RDW 86.1 88.4 - 84.4 %   Platelets 287 150 - 400 K/uL   nRBC 0.0 0.0 - 0.2 %   Neutrophils Relative % 70 %   Neutro Abs 8.2 (H) 1.7 - 7.7 K/uL   Lymphocytes Relative 18 %   Lymphs Abs 2.1 0.7 - 4.0 K/uL   Monocytes Relative 10 %   Monocytes Absolute 1.2 (H) 0.1 - 1.0 K/uL   Eosinophils Relative 1 %   Eosinophils Absolute 0.1 0.0 - 0.5 K/uL   Basophils Relative 1 %   Basophils Absolute 0.1 0.0 - 0.1 K/uL   Immature Granulocytes 0 %   Abs Immature Granulocytes 0.04 0.00 - 0.07 K/uL    Comment: Performed at [facility], [address] [place], [place] [address]  Protime-INR     Status: None   Collection Time: 09/03/24  4:10 PM  Result Value Ref Range   Prothrombin Time 12.6 11.4 - 15.2 seconds   INR 0.9 0.8 - 1.2    Comment: (NOTE) INR goal varies based on device and disease states. Performed at [facility], [address], [place], [place] [address]   Culture, blood (Routine x 2)     Status: None (Preliminary result)   Collection Time: 09/03/24  4:50 PM   Specimen: BLOOD RIGHT ARM  Result Value Ref Range   Specimen Description BLOOD RIGHT ARM    Special Requests      BOTTLES DRAWN AEROBIC AND ANAEROBIC Blood Culture adequate volume Performed at [facility], [address] [place], [place] [address]    Culture PENDING    Report Status PENDING   I-Stat Lactic Acid, ED     Status: None   Collection Time: 09/03/24  5:01 PM  Result Value Ref Range   Lactic Acid, Venous 1.0 0.5 - 1.9 mmol/L  Resp panel by RT-PCR (RSV, Flu A&B, Covid) Anterior Nasal Swab     Status: None   Collection Time: 09/03/24  5:16 PM   Specimen: Anterior Nasal Swab  Result Value Ref Range   SARS Coronavirus 2 by RT PCR NEGATIVE NEGATIVE    Comment: (NOTE) SARS-CoV-2 target nucleic acids are  NOT DETECTED.  The SARS-CoV-2 RNA is generally detectable in upper respiratory specimens during the acute phase of infection. The lowest concentration of  SARS-CoV-2 viral copies this assay can detect is 138 copies/mL. A negative result does not preclude SARS-Cov-2 infection and should not be used as the sole basis for treatment or other patient management decisions. A negative result may occur with  improper specimen collection/handling, submission of specimen other than nasopharyngeal swab, presence of viral mutation(s) within the areas targeted by this assay, and inadequate number of viral copies(<138 copies/mL). A negative result must be combined with clinical observations, patient history, and epidemiological information. The expected result is Negative.  Fact Sheet for Patients:  bloggercourse.com  Fact Sheet for Healthcare Providers:  seriousbroker.it  This test is no t yet approved or cleared by the [place]  FDA and  has been authorized for detection and/or diagnosis of SARS-CoV-2 by FDA under an Emergency Use Authorization (EUA). This EUA will remain  in effect (meaning this test can be used) for the duration of the COVID-19 declaration under Section 564(b)(1) of the Act, 21 U.S.C.section 360bbb-3(b)(1), unless the authorization is terminated  or revoked sooner.       Influenza A by PCR NEGATIVE NEGATIVE   Influenza B by PCR NEGATIVE NEGATIVE    Comment: (NOTE) The Xpert Xpress SARS-CoV-2/FLU/RSV plus assay is intended as an aid in the diagnosis of influenza from Nasopharyngeal swab specimens and should not be used as a sole basis for treatment. Nasal washings and aspirates are unacceptable for Xpert Xpress SARS-CoV-2/FLU/RSV testing.  Fact Sheet for Patients: bloggercourse.com  Fact Sheet for Healthcare Providers: seriousbroker.it  This test is not yet approved or cleared by the [place]  FDA and has been authorized for detection and/or diagnosis of SARS-CoV-2 by FDA under an Emergency Use Authorization (EUA).  This EUA will remain in effect (meaning this test can be used) for the duration of the COVID-19 declaration under Section 564(b)(1) of the Act, 21 U.S.C. section 360bbb-3(b)(1), unless the authorization is terminated or revoked.     Resp Syncytial Virus by PCR NEGATIVE NEGATIVE    Comment: (NOTE) Fact Sheet for Patients: bloggercourse.com  Fact Sheet for Healthcare Providers: seriousbroker.it  This test is not yet approved or cleared by the [place]  FDA and has been authorized for detection and/or diagnosis of SARS-CoV-2 by FDA under an Emergency Use Authorization (EUA). This EUA will remain in effect (meaning this test can be used) for the duration of the COVID-19 declaration under Section 564(b)(1) of the Act, 21 U.S.C. section 360bbb-3(b)(1), unless the authorization is terminated or revoked.  Performed at [facility], [address] [place], [place] [address]   Urinalysis, w/ Reflex to Culture (Infection Suspected) -Urine, Clean Catch     Status: Abnormal   Collection Time: 09/03/24  5:28 PM  Result Value Ref Range   Specimen Source URINE, CATHETERIZED    Color, Urine YELLOW YELLOW   APPearance HAZY (A) CLEAR   Specific Gravity, Urine 1.015 1.005 - 1.030   pH 5.0 5.0 - 8.0   Glucose, UA NEGATIVE NEGATIVE mg/dL   Hgb urine dipstick NEGATIVE NEGATIVE   Bilirubin Urine NEGATIVE NEGATIVE   Ketones, ur NEGATIVE NEGATIVE mg/dL   Protein, ur NEGATIVE NEGATIVE mg/dL   Nitrite NEGATIVE NEGATIVE   Leukocytes,Ua NEGATIVE NEGATIVE   RBC / HPF 0-5 0 - 5 RBC/hpf   WBC, UA 0-5 0 - 5 WBC/hpf    Comment:        Reflex urine  culture not performed if WBC <=10, OR if Squamous epithelial cells >5. If Squamous epithelial cells >5 suggest recollection.    Bacteria, UA MANY (A) NONE SEEN   Squamous Epithelial / HPF 6-10 0 - 5 /HPF   Mucus PRESENT     Comment: Performed at [facility], [address] [place], [place] [address]  Procalcitonin     Status: None   Collection Time: 09/03/24 10:00 PM  Result Value Ref Range   Procalcitonin <0.10 ng/mL    Comment: (NOTE)   Sepsis PCT Algorithm          Lower Respiratory Tract Infection                                         PCT Algorithm -----------------------------------------------------------------  <0.5 ng/mL                    <0.10 ng/mL  Associated with low           Antibiotic therapy strongly   risk for progression          discouraged. Indicates absence   to severe sepsis              of bacteria infection  and/or septic shock             --------------------------------------------------------------  0.5-2.0 ng/mL                 0.10-0.25 ng/mL  Recommended to retest         Antibiotic therapy discouraged.  PCT within 6-24 hours         Bacterial infection unlikely  ------------------------------------------------------------  >2 ng/mL                      0.26-0.50 ng/mL  Associated with high risk     Antibiotic therapy encouraged.  for progression to severe     Bacterial infection possible  sepsis/and or septic shock    ------------------------------                                 >0.50 ng/mL                                Antibiotic therapy strongly                                 encouraged.                                Suggestive of presence of                                 bacterial infection.                                 -------------------------------------------------------------------  < or = 0.50 ng/mL OR          < or = 0.25 OR 80% decrease in PCT  80% decrease in PCT  Antibiotic therapy   Antibiotic therapy may        may be discontinued  be discontinued                                 Performed at [facility], [address], [place], [place] [address]   Sedimentation rate     Status: None   Collection Time: 09/03/24 10:00 PM  Result Value Ref Range   Sed  Rate 18 0 - 22 mm/hr    Comment: Performed at [facility], [address], [place], [place] [address]   DG Chest 1 View Result Date: 09/03/2024 CLINICAL DATA:  Fever. EXAM: CHEST  1 VIEW COMPARISON:  September 19, 2018 FINDINGS: The heart size and mediastinal contours are within normal limits. Low lung volumes are noted. Mild linear atelectasis and/or infiltrate is seen within the left lung base. No pleural effusion or pneumothorax is identified. The visualized skeletal structures are unremarkable. IMPRESSION: Low lung volumes with mild left basilar linear atelectasis and/or infiltrate. Electronically Signed   By: Suzen Dials M.D.   On: 09/03/2024 18:17    Pending Labs Unresulted Labs (From admission, onward)     Start     Ordered   09/04/24 0500  CBC  Tomorrow morning,   R        09/03/24 2220   09/04/24 0500  Basic metabolic panel  Tomorrow morning,   R        09/03/24 2220   09/03/24 2220  HIV Antibody (routine testing w rflx)  (HIV Antibody (Routine testing w reflex) panel)  Once,   R        09/03/24 2220   09/03/24 2139  C-reactive protein  Once,   R        09/03/24 2138   09/03/24 2135  Urine Culture (for pregnant, neutropenic or urologic patients or patients with an indwelling urinary catheter)  (Urine Labs)  Once,   URGENT       Question:  Indication  Answer:  Sepsis   09/03/24 2135   09/03/24 2134  Procalcitonin  Daily,   R      09/03/24 2133   09/03/24 1617  Culture, blood (Routine x 2)  BLOOD CULTURE X 2,   R (with STAT occurrences)      09/03/24 1617            Vitals/Pain Today's Vitals   09/03/24 2206 09/03/24 2257 09/03/24 2300 09/03/24 2304  BP:   (!) 111/55   Pulse:   67   Resp:      Temp:    98.1 F (36.7 C)  TempSrc:    Oral  SpO2:   94%   Weight:      Height:      PainSc: 6  6       Isolation Precautions No active isolations  Medications Medications  HYDROcodone -acetaminophen  (NORCO/VICODIN) 5-325 MG per tablet 1-2 tablet  (has no administration in time range)  acetaminophen  (TYLENOL ) tablet 650 mg (has no administration in time range)    Or  acetaminophen  (TYLENOL ) suppository 650 mg (has no administration in time range)  0.9 %  sodium chloride  infusion ( Intravenous New Bag/Given 09/03/24 2302)  ondansetron  (ZOFRAN ) tablet 4 mg ( Oral See Alternative 09/03/24 2257)    Or  ondansetron  (ZOFRAN ) injection 4 mg (4 mg Intravenous Given 09/03/24 2257)  HYDROmorphone  (DILAUDID ) injection 0.5 mg (0.5  mg Intravenous Given 09/03/24 2257)  cefTRIAXone  (ROCEPHIN ) 1 g in sodium chloride  0.9 % 100 mL IVPB (has no administration in time range)  vancomycin  (VANCOREADY) IVPB 2000 mg/400 mL (has no administration in time range)  HYDROmorphone  (DILAUDID ) injection 0.5 mg (0.5 mg Intravenous Given 09/03/24 1746)  sodium chloride  0.9 % bolus 1,000 mL (0 mLs Intravenous Stopped 09/03/24 2018)  cefTRIAXone  (ROCEPHIN ) 1 g in sodium chloride  0.9 % 100 mL IVPB (0 g Intravenous Stopped 09/03/24 1900)  vancomycin  (VANCOREADY) IVPB 1750 mg/350 mL (0 mg Intravenous Stopped 09/03/24 2031)  morphine  (PF) 2 MG/ML injection 2 mg (2 mg Intravenous Given 09/03/24 2159)    Mobility walks with device     [1]  Allergies Allergen Reactions   Ampicillin Hives    Can tolerate amoxicillin  and penicillin   Dust Mite Extract Cough   Erythromycin Nausea And Vomiting   Mold Extract [Trichophyton] Cough   Other Hives    SAUERKRAUT   Nyquil Hbp Cold & Flu [Dm-Doxylamine-Acetaminophen ]     Flushing, body temp increases   Wine [Alcohol] Hives   Ciprofloxacin  Hives and Rash

## 2024-09-03 NOTE — Progress Notes (Signed)
 Pharmacy Note   A consult was received from an ED physician for vancomycin  per pharmacy dosing.    The patient's profile has been reviewed for ht/wt/allergies/indication/available labs.    A one time order has been placed for vancomycin  1750 mg IV x1 .    Further antibiotics/pharmacy consults should be ordered by admitting physician if indicated.                       Thank you,  Teniyah Seivert, PharmD, BCPS 09/03/2024 6:00 PM

## 2024-09-03 NOTE — Assessment & Plan Note (Signed)
 Continue prozac .

## 2024-09-03 NOTE — H&P (Signed)
 " History and Physical    Patient: Mackenzie Meyers FMW:[id number] DOB: September 12, 1970 DOA: 09/03/2024 DOS: the patient was seen and examined on 09/03/2024 PCP: Nena Rosina CROME, PA-C  Patient coming from: Home - lives with her husband and kids.    Chief Complaint: post op fever   HPI: Mackenzie Meyers is a 54 y.o. female with medical history significant of left RCC, anxiety and OCD, OSA on cpap, HSV, Sjogren syndrome who presented to ED with fever. She had surgery yesterday on her bilateral great toes for arthritis. She had fever at home up to 102.7 and was told to come to ED. Per note from podiatry appears she had a bilateral great toe arthroplasty with Silastic double stem implant yesterday. Her fever started today this morning, but she didn't check her temperature until this afternoon and it was 102.7. She was instructed to go to ED. She states the pain has been excruciating since she woke up from surgery. No drainage, erythema of wounds. She has had no upper respiratory symptoms, no cough or shortness of breath and no urinary symptoms. Eating and drinking fine.    Denies any  vision changes/headaches, chest pain or palpitations, shortness of breath or cough, abdominal pain, N/V/D, dysuria or leg swelling.    She does not smoke or drink alcohol, but she vapes.   ER Course:  vitals: temp: 100.1, bp: 137/67, HR: 102, RR: 18, oxygen: 94% on RA Pertinent labs: wbc: 11.7, lactic acid 1.0, rsv/covid/flu: negative  CXR: clear  In ED: podiatry consulted. Cultures obtained. Started on vanc/rocephin  and [facility] asked to admit    Review of Systems: As mentioned in the history of present illness. All other systems reviewed and are negative. 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   History of kidney stones 10/2011   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   Normal cytology   Hx of migraines    OCD (obsessive compulsive disorder)    cleanliness with home and workspace   Rosacea    Sinus congestion 10/12/2012   Sleep apnea 04/2017   patient is wearing C-pap   Tobacco abuse 1989   max was 1.5 packs per [place]   Vitamin D  deficiency 10/2013   Value 29   Vitiligo    Past Surgical History:  Procedure Laterality Date   ABDOMINOPLASTY  2008   BREAST BIOPSY Right 10/15/2012   Procedure: BREAST BIOPSY;  Surgeon: Sherlean JINNY Laughter, MD;  Location: [facility];  Service: General;  Laterality: Right;  removal right breast mass   BREAST EXCISIONAL BIOPSY Right    BREAST SURGERY     Lift   CESAREAN SECTION  2004, 2007   CESAREAN SECTION N/A 02/07/2015   Procedure: CESAREAN SECTION;  Surgeon: Jolene Gaskins, MD;  Location: [facility];  Service: Obstetrics;  Laterality: N/A;  EDD: 02/13/15    dialation and cutterage     FOOT SURGERY Right 06/19/2021   GYNECOLOGIC CRYOSURGERY  age 75   HERNIA REPAIR  03/03/2019   INTRAUTERINE DEVICE INSERTION  03/2015   Mirena  at [facility]   MICRODISCECTOMY LUMBAR     12-2017    REDUCTION MAMMAPLASTY Bilateral    ROBOTIC ASSITED PARTIAL NEPHRECTOMY Left 09/17/2018   Procedure: XI ROBOTIC ASSITED PARTIAL NEPHRECTOMY;  Surgeon: Sherrilee Belvie CROME, MD;  Location: [facility];  Service: Urology;  Laterality: Left;   TONSILLECTOMY  2000   Social History:  reports that she has been smoking cigarettes. She started smoking about 5 years ago. She has a 5.9 pack-year smoking history. She has  never used smokeless tobacco. She reports current alcohol use. She reports that she does not use drugs.  Allergies[1]  Family History  Problem Relation Age of Onset   Arthritis Mother    High Cholesterol Mother    High blood pressure Mother    Hearing loss Father    High Cholesterol Father    High blood pressure Father    Diabetes Father    Asthma Brother    Lung cancer Maternal Grandmother        and maternal uncle; small cell    Arthritis Maternal Grandmother    Hyperlipidemia Maternal Grandmother    Hypertension Maternal Grandmother    Hearing loss Maternal Grandmother    Cancer Maternal Grandfather        lymphoma   Alcohol abuse Paternal Grandmother    Hypertension Paternal Grandmother    Cancer Paternal Grandfather        liver   Alcohol abuse Paternal Grandfather    Eczema Son    Cancer Maternal Uncle        Lung cancer   Hyperlipidemia Other    Colon cancer Neg Hx    Breast cancer Neg Hx    Allergic rhinitis Neg Hx    Angioedema Neg Hx    Urticaria Neg Hx     Prior to Admission medications  Medication Sig Start Date End Date Taking? Authorizing Provider  acetaminophen  (TYLENOL ) 500 MG tablet Take 500 mg by mouth every 6 (six) hours as needed.    [provider]  Cyanocobalamin  (VITAMIN B-12 PO) Take by mouth.    [provider]  diazepam  (VALIUM ) 5 MG tablet Take 1 tablet (5 mg total) by mouth every 12 (twelve) hours as needed for muscle spasms. 01/14/22   Job Lukes, PA  FLUoxetine  (PROZAC ) 20 MG capsule Take 20 mg by mouth daily. 02/07/22   [provider]  HYDROmorphone  (DILAUDID ) 4 MG tablet Take 1 tablet (4 mg total) by mouth every 4 (four) hours as needed for severe pain (pain score 7-10). 09/02/24   Tobie Franky SQUIBB, DPM  levonorgestrel  (MIRENA ) 20 MCG/24HR IUD 1 each by Intrauterine route once. Inserted by GYN on 03/2015 needs to be removed 03/2020.    [provider]  MAGNESIUM  GLYCINATE PO Take by mouth.    [provider]  meloxicam  (MOBIC ) 15 MG tablet Take 7.5 mg by mouth 2 (two) times daily.    [provider]  valACYclovir  (VALTREX ) 500 MG tablet Take 1 tablet (500 mg total) by mouth daily. 07/30/21   Job Lukes, PA  VITAMIN D  PO Take by mouth.    [provider]    Physical Exam: Vitals:   09/03/24 2019 09/03/24 2023 09/03/24  2130 09/03/24 2200  BP:   (!) 100/55 (!) 135/56  Pulse:   (!) 58 71  Resp:      Temp:  98.3 F (36.8 C)  98.4 F (36.9 C)  TempSrc:  Oral  Oral  SpO2:   100% 98%  Weight: 90.3 kg     Height: 5' 4 (1.626 m)      General:  Appears calm and in discomfort due to pain in her toes  Eyes:  PERRL, EOMI, normal lids, iris ENT:  grossly normal hearing, lips & tongue, mmm; appropriate dentition Neck:  no LAD, masses or thyromegaly; no carotid bruits Cardiovascular:  RRR, no m/r/g. No LE edema.  Respiratory:   CTA bilaterally with no wheezes/rales/rhonchi.  Normal respiratory effort. Abdomen:  soft, NT, ND, NABS Back:   normal alignment, no CVAT Skin:  no rash or induration seen on limited exam Musculoskeletal:  grossly normal tone BUE/BLE, good ROM,  Lower extremity:  No LE edema.  Limited foot exam with no ulcerations.  2+ distal pulses. She has bilateral boots on and big toes with no erythema, no drainage   Psychiatric:  grossly normal mood and affect, speech fluent and appropriate, AOx3 Neurologic:  CN 2-12 grossly intact, moves all extremities in coordinated fashion, sensation intact   Radiological Exams on Admission: Independently reviewed - see discussion in A/P where applicable  DG Chest 1 View Result Date: 09/03/2024 CLINICAL DATA:  Fever. EXAM: CHEST  1 VIEW COMPARISON:  September 19, 2018 FINDINGS: The heart size and mediastinal contours are within normal limits. Low lung volumes are noted. Mild linear atelectasis and/or infiltrate is seen within the left lung base. No pleural effusion or pneumothorax is identified. The visualized  skeletal structures are unremarkable. IMPRESSION: Low lung volumes with mild left basilar linear atelectasis and/or infiltrate. Electronically Signed   By: Suzen Dials M.D.   On: 09/03/2024 18:17     Labs on Admission: I have personally reviewed the available labs and imaging studies at the time of the admission.  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  -obs to med surg -bilateral toes with no erythema or drainage -podiatry consulted -CXR with mild left atelectasis vs. 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 Tobie    Procedures: None    Antibiotics: Vancomycin  and rocephin  09/03/24  DVT Prophylaxis: SCDs  Family Communication: husband at bedside   Severity  of Illness: The appropriate patient status for this patient is OBSERVATION. Observation status is judged to be reasonable and necessary in order to provide the required intensity of service to ensure the patient's safety. The patient's presenting symptoms, physical exam findings, and initial radiographic and laboratory data in the context of their medical condition is felt to place them at decreased risk for further clinical deterioration. Furthermore, it is anticipated that the patient will be medically stable for discharge from the hospital within 2 midnights of admission.   Author: Isaiah Geralds, MD 09/03/2024 10:24 PM  For on call review [email or web address].      [1]  Allergies Allergen Reactions   Ampicillin Hives    Can tolerate amoxicillin  and penicillin   Dust Mite Extract Cough   Erythromycin Nausea And Vomiting   Mold Extract [Trichophyton] Cough   Other Hives    SAUERKRAUT   Nyquil Hbp Cold & Flu [Dm-Doxylamine-Acetaminophen ]     Flushing, body temp increases   Wine [Alcohol] Hives   Ciprofloxacin  Hives and Rash   "

## 2024-09-03 NOTE — ED Triage Notes (Signed)
 Pt reports surgery on bilateral big toes yesterday, pt was told to come to ER  if she developed a fever greater than 102, pt reports fever today at home was 102.7, took tylenol  and temp now 100.1.Pt denies any other complaints other than pain at surgery location

## 2024-09-04 DIAGNOSIS — M19071 Primary osteoarthritis, right ankle and foot: Secondary | ICD-10-CM | POA: Diagnosis present

## 2024-09-04 DIAGNOSIS — Z801 Family history of malignant neoplasm of trachea, bronchus and lung: Secondary | ICD-10-CM | POA: Diagnosis not present

## 2024-09-04 DIAGNOSIS — E669 Obesity, unspecified: Secondary | ICD-10-CM | POA: Diagnosis present

## 2024-09-04 DIAGNOSIS — Z6834 Body mass index (BMI) 34.0-34.9, adult: Secondary | ICD-10-CM | POA: Diagnosis not present

## 2024-09-04 DIAGNOSIS — R5082 Postprocedural fever: Secondary | ICD-10-CM | POA: Diagnosis present

## 2024-09-04 DIAGNOSIS — M19072 Primary osteoarthritis, left ankle and foot: Secondary | ICD-10-CM | POA: Diagnosis present

## 2024-09-04 DIAGNOSIS — Z8632 Personal history of gestational diabetes: Secondary | ICD-10-CM | POA: Diagnosis not present

## 2024-09-04 DIAGNOSIS — Z87410 Personal history of cervical dysplasia: Secondary | ICD-10-CM | POA: Diagnosis not present

## 2024-09-04 DIAGNOSIS — Z1152 Encounter for screening for COVID-19: Secondary | ICD-10-CM | POA: Diagnosis not present

## 2024-09-04 DIAGNOSIS — Z822 Family history of deafness and hearing loss: Secondary | ICD-10-CM | POA: Diagnosis not present

## 2024-09-04 DIAGNOSIS — Z87442 Personal history of urinary calculi: Secondary | ICD-10-CM | POA: Diagnosis not present

## 2024-09-04 DIAGNOSIS — F17210 Nicotine dependence, cigarettes, uncomplicated: Secondary | ICD-10-CM | POA: Diagnosis present

## 2024-09-04 DIAGNOSIS — Z9989 Dependence on other enabling machines and devices: Secondary | ICD-10-CM | POA: Diagnosis not present

## 2024-09-04 DIAGNOSIS — Z833 Family history of diabetes mellitus: Secondary | ICD-10-CM | POA: Diagnosis not present

## 2024-09-04 DIAGNOSIS — Z825 Family history of asthma and other chronic lower respiratory diseases: Secondary | ICD-10-CM | POA: Diagnosis not present

## 2024-09-04 DIAGNOSIS — F429 Obsessive-compulsive disorder, unspecified: Secondary | ICD-10-CM | POA: Diagnosis present

## 2024-09-04 DIAGNOSIS — Y838 Other surgical procedures as the cause of abnormal reaction of the patient, or of later complication, without mention of misadventure at the time of the procedure: Secondary | ICD-10-CM | POA: Diagnosis present

## 2024-09-04 DIAGNOSIS — G4733 Obstructive sleep apnea (adult) (pediatric): Secondary | ICD-10-CM | POA: Diagnosis present

## 2024-09-04 DIAGNOSIS — Z8249 Family history of ischemic heart disease and other diseases of the circulatory system: Secondary | ICD-10-CM | POA: Diagnosis not present

## 2024-09-04 DIAGNOSIS — F419 Anxiety disorder, unspecified: Secondary | ICD-10-CM | POA: Diagnosis present

## 2024-09-04 DIAGNOSIS — A419 Sepsis, unspecified organism: Secondary | ICD-10-CM | POA: Diagnosis present

## 2024-09-04 DIAGNOSIS — Z83438 Family history of other disorder of lipoprotein metabolism and other lipidemia: Secondary | ICD-10-CM | POA: Diagnosis not present

## 2024-09-04 DIAGNOSIS — T8144XA Sepsis following a procedure, initial encounter: Secondary | ICD-10-CM | POA: Diagnosis present

## 2024-09-04 DIAGNOSIS — Z905 Acquired absence of kidney: Secondary | ICD-10-CM | POA: Diagnosis not present

## 2024-09-04 DIAGNOSIS — M3500 Sicca syndrome, unspecified: Secondary | ICD-10-CM | POA: Diagnosis present

## 2024-09-04 DIAGNOSIS — Z807 Family history of other malignant neoplasms of lymphoid, hematopoietic and related tissues: Secondary | ICD-10-CM | POA: Diagnosis not present

## 2024-09-04 LAB — CBC
HCT: 37.9 % (ref 36.0–46.0)
Hemoglobin: 12.2 g/dL (ref 12.0–15.0)
MCH: 28.4 pg (ref 26.0–34.0)
MCHC: 32.2 g/dL (ref 30.0–36.0)
MCV: 88.3 fL (ref 80.0–100.0)
Platelets: 225 K/uL (ref 150–400)
RBC: 4.29 MIL/uL (ref 3.87–5.11)
RDW: 13.9 % (ref 11.5–15.5)
WBC: 9.5 K/uL (ref 4.0–10.5)
nRBC: 0 % (ref 0.0–0.2)

## 2024-09-04 LAB — BASIC METABOLIC PANEL WITH GFR
Anion gap: 10 (ref 5–15)
BUN: 8 mg/dL (ref 6–20)
CO2: 24 mmol/L (ref 22–32)
Calcium: 8.9 mg/dL (ref 8.9–10.3)
Chloride: 104 mmol/L (ref 98–111)
Creatinine, Ser: 0.67 mg/dL (ref 0.44–1.00)
GFR, Estimated: >60 mL/min
Glucose, Bld: 150 mg/dL — ABNORMAL HIGH (ref 70–99)
Potassium: 3.8 mmol/L (ref 3.5–5.1)
Sodium: 138 mmol/L (ref 135–145)

## 2024-09-04 LAB — PROCALCITONIN: Procalcitonin: <0.1 ng/mL

## 2024-09-04 LAB — C-REACTIVE PROTEIN: CRP: 6.4 mg/dL — ABNORMAL HIGH

## 2024-09-04 LAB — HIV ANTIBODY (ROUTINE TESTING W REFLEX): HIV Screen 4th Generation wRfx: NONREACTIVE

## 2024-09-04 MED ORDER — PREGABALIN 25 MG PO CAPS
50.0000 mg | ORAL_CAPSULE | Freq: Three times a day (TID) | ORAL | Status: DC
  Administered 2024-09-04 – 2024-09-05 (×3): 50 mg via ORAL
  Filled 2024-09-04 (×3): qty 2

## 2024-09-04 MED ORDER — VALACYCLOVIR HCL 500 MG PO TABS
500.0000 mg | ORAL_TABLET | Freq: Every day | ORAL | Status: DC
  Administered 2024-09-05: 500 mg via ORAL
  Filled 2024-09-04: qty 1

## 2024-09-04 MED ORDER — FLUOXETINE HCL 20 MG PO CAPS
20.0000 mg | ORAL_CAPSULE | Freq: Every day | ORAL | Status: DC
  Administered 2024-09-04 – 2024-09-05 (×2): 20 mg via ORAL
  Filled 2024-09-04 (×2): qty 1

## 2024-09-04 MED ORDER — ENOXAPARIN SODIUM 40 MG/0.4ML IJ SOSY
40.0000 mg | PREFILLED_SYRINGE | Freq: Every evening | INTRAMUSCULAR | Status: DC
  Administered 2024-09-04: 40 mg via SUBCUTANEOUS
  Filled 2024-09-04: qty 0.4

## 2024-09-04 MED ORDER — IBUPROFEN 400 MG PO TABS
600.0000 mg | ORAL_TABLET | Freq: Four times a day (QID) | ORAL | Status: DC | PRN
  Administered 2024-09-04 – 2024-09-05 (×2): 600 mg via ORAL
  Filled 2024-09-04 (×2): qty 1

## 2024-09-04 MED ORDER — IBUPROFEN 400 MG PO TABS
800.0000 mg | ORAL_TABLET | Freq: Once | ORAL | Status: AC
  Administered 2024-09-04: 800 mg via ORAL
  Filled 2024-09-04: qty 2

## 2024-09-04 NOTE — Progress Notes (Signed)
" °   09/04/24 2208  BiPAP/CPAP/SIPAP  Reason BIPAP/CPAP not in use Other(comment) (patient is waiting for her home cpap mask to use. she does not want to wear it with our mask. She is hoping to have it tomorrow night. I told her if she changes her mind to just let us  knowl)    "

## 2024-09-04 NOTE — Consult Note (Addendum)
"  °  Subjective:  Patient ID: Mackenzie Meyers, female    DOB: Oct 03, 1970,  MRN: [id number]  A 54 y.o. female  medical history significant of left RCC, anxiety and OCD, OSA on cpap, HSV, Sjogren syndrome who presented to ED with fever.  Patient had recently undergone surgery by Dr. Janit to bilateral feet with Silastic arthroplasty of the first metatarsophalangeal joint.  She states she is having burning shooting tingling pain to both of her feet however she had 102.7 fever spike and came to the emergency room to be evaluated.  She denies any acute complaints no nausea fever chills vomiting no shortness of breath.  Objective:   Vitals:   09/04/24 1012 09/04/24 1310  BP: (!) 105/54 (!) 116/53  Pulse: 71 74  Resp: 18 18  Temp: 98.7 F (37.1 C) 99.8 F (37.7 C)  SpO2: 97% 94%   General AA&O x3. Normal mood and affect.  Vascular Dorsalis pedis and posterior tibial pulses 2/4 bilat. Brisk capillary refill to all digits. Pedal hair present.  Neurologic Epicritic sensation grossly intact.  Dermatologic Incision is well coapted.  No signs of infection noted no crepitus noted.  No erythema noted.  Motor or sensory functions are intact.  Mild swelling noted  Orthopedic: MMT 5/5 in dorsiflexion, plantarflexion, inversion, and eversion. Normal joint ROM without pain or crepitus.       Assessment & Plan:  Patient was evaluated and treated and all questions answered.  Bilateral first metatarsophalangeal joint arthroplasty postop Giuliani 2 - All questions and concerns were discussed with the patient in extensive detail - Clinically there is no concern for infection as well as radiographically everything appears to be within normal limits no soft tissue emphysema noted. - At this time patient is okay to be discharged from podiatric standpoint. - No dressing change and to follow-up.  Follow-up already scheduled with Dr. Janit - Bilateral weightbearing as tolerated in surgical shoe - Pain control patient  will benefit from ibuprofen  800mg  and Lyrica  100 mg twice a Hirata  Mackenzie Meyers, DPM  Accessible via secure chat for questions or concerns.  "

## 2024-09-04 NOTE — Progress Notes (Signed)
" °   09/04/24 0056  BiPAP/CPAP/SIPAP  $ Non-Invasive Home Ventilator  Initial  $ Face Mask Medium Yes  BiPAP/CPAP/SIPAP Pt Type Adult  BiPAP/CPAP/SIPAP DREAMSTATIOND  Mask Type Full face mask  Dentures removed? Not applicable  Mask Size Medium  FiO2 (%) 21 %  Patient Home Machine No  Patient Home Mask No  Patient Home Tubing No  Auto Titrate Yes  Minimum cmH2O 4 cmH2O  Maximum cmH2O 20 cmH2O  Device Plugged into RED Power Outlet Yes  BiPAP/CPAP /SiPAP Vitals  Pulse Rate 73  Resp 17  SpO2 93 %  Bilateral Breath Sounds Clear  MEWS Score/Color  MEWS Score 0  MEWS Score Color Green    "

## 2024-09-04 NOTE — Progress Notes (Signed)
 [facility] Progress Note  Patient: Mackenzie Meyers    FMW:[id number]  DOA: 09/03/2024     Date of Service: the patient was seen and examined on 09/04/2024  Chief Complaint  Patient presents with   Post-op Problem   Brief hospital course:  Willowdean Luhmann Macon is a 54 y.o. female with medical history significant of left RCC, anxiety and OCD, OSA on cpap, HSV, Sjogren syndrome who presented to ED with fever. She had surgery yesterday on her bilateral great toes for arthritis. She had fever at home up to 102.7 and was told to come to ED. Per note from podiatry appears she had a bilateral great toe arthroplasty with Silastic double stem implant yesterday. Her fever started today this morning, but she didn't check her temperature until this afternoon and it was 102.7. She was instructed to go to ED. She states the pain has been excruciating since she woke up from surgery. No drainage, erythema of wounds. She has had no upper respiratory symptoms, no cough or shortness of breath and no urinary symptoms. Eating and drinking fine.     Denies any  vision changes/headaches, chest pain or palpitations, shortness of breath or cough, abdominal pain, N/V/D, dysuria or leg swelling.      She does not smoke or drink alcohol, but she vapes.    ER Course:  vitals: temp: 100.1, bp: 137/67, HR: 102, RR: 18, oxygen: 94% on RA Pertinent labs: wbc: 11.7, lactic acid 1.0, rsv/covid/flu: negative  CXR: clear  In ED: podiatry consulted. Cultures obtained. Started on vanc/rocephin  and [facility] asked to admit    Assessment and Plan:  # Sepsis most likely postop infection S/p Silastic double stem implants by podiatry  Patient presented with fever, tachycardia and leukocytosis, met sepsis criteria. Continue ceftriaxone  and vancomycin  Pharmacy consulted for dosing and trough monitoring Follow cultures Continue as needed medication for pain control Podiatry consulted, recommended no further intervention Patient would like to  use Lyrica  and ibuprofen , which has been resumed WBC count improved, procalcitonin negative. Will continue IV antibiotics today to make sure patient remains afebrile for 24 hours.  De-escalate antibiotics tomorrow a.m.   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  Interventions:  Diet: Regular diet DVT Prophylaxis: Subcutaneous Lovenox    Advance goals of care discussion: Full code  Family Communication: family was present at bedside, at the time of interview.  The pt provided permission to discuss medical plan with the family. Opportunity was given to ask question and all questions were answered satisfactorily.   Disposition:  Pt is from home, admitted with sepsis, and postop pain, still has significant pain and on IV antibiotics, which precludes a safe discharge. Discharge to home, when stable, most likely 1 to 2 days.  Subjective: No significant events overnight.  Patient is still complaining of significant pain in the bilateral feet, 7/10, denied any other complaints.   Physical Exam: General: NAD, lying comfortably Appear in no distress, affect appropriate Eyes: PERRLA ENT: Oral Mucosa Clear, moist  Neck: no JVD,  Cardiovascular: S1 and S2 Present, no  Murmur,  Respiratory: good respiratory effort, Bilateral Air entry equal and Decreased, no Crackles, no wheezes Abdomen: Bowel Sound present, Soft and no tenderness,  Skin: no rashes Extremities: s/p bilateral great toe surgery, dressing CDI Neurologic: without any new focal findings Gait  not checked due to patient safety concerns  Vitals:   09/04/24 0131 09/04/24 0509 09/04/24 1012 09/04/24 1310  BP: 112/62 (!) 105/57 (!) 105/54 (!) 116/53  Pulse: 68 67 71 74  Resp: 18 16 18 18   Temp: 98.2 F (36.8 C) 98.5 F (36.9 C) 98.7 F (37.1 C) 99.8 F (37.7 C)  TempSrc: Oral Oral Oral Oral  SpO2: 95% 91% 97% 94%  Weight:      Height:        Intake/Output Summary (Last 24 hours) at 09/04/2024 1504 Last data filed at 09/04/2024 1427 Gross per 24 hour  Intake 2282.48 ml  Output --  Net 2282.48 ml   Filed Weights   09/03/24 2019  Weight: 90.3 kg    Data Reviewed: I have personally reviewed and interpreted daily labs, tele strips, imagings as discussed above. I reviewed all nursing notes, pharmacy notes, vitals, pertinent old records I have discussed plan of care as described above with RN and patient/family.  CBC: Recent Labs  Lab 09/03/24 1610 09/04/24 0502  WBC 11.7* 9.5  NEUTROABS 8.2*  --   HGB 14.0 12.2  HCT 43.0 37.9  MCV 87.8 88.3  PLT 287 225   Basic Metabolic Panel: Recent Labs  Lab 09/03/24 1610 09/04/24 0502  NA 137 138  K 3.8 3.8  CL 102 104  CO2 25 24  GLUCOSE 170* 150*  BUN 12 8  CREATININE 0.72 0.67  CALCIUM  9.5 8.9    Studies: DG Chest 1 View Result Date: 09/03/2024 CLINICAL DATA:  Fever. EXAM: CHEST  1 VIEW COMPARISON:  September 19, 2018 FINDINGS: The heart size and mediastinal contours are within normal limits. Low lung volumes are noted. Mild linear atelectasis and/or infiltrate is seen within the left lung base. No pleural effusion or pneumothorax is identified. The visualized skeletal structures are unremarkable. IMPRESSION: Low lung volumes with mild left basilar linear atelectasis and/or infiltrate. Electronically Signed   By: Suzen Dials M.D.   On: 09/03/2024 18:17    Scheduled Meds:  pregabalin   50 mg Oral TID   Continuous Infusions:  sodium chloride  75 mL/hr at 09/04/24 1258   cefTRIAXone  (ROCEPHIN )  IV 1 g  (09/04/24 0910)   vancomycin      PRN Meds: acetaminophen  **OR** acetaminophen , HYDROcodone -acetaminophen , HYDROmorphone  (DILAUDID ) injection, ibuprofen , ondansetron  **OR** ondansetron  (ZOFRAN ) IV  Time spent: 35 minutes  Author: ELVAN SOR. MD [facility] 09/04/2024 3:04 PM  To reach On-call, see care teams to locate the attending and reach out to them via [email or web address]. If 7PM-7AM, please contact night-coverage If you still have difficulty reaching the attending provider, please page the [facility] (Director on Call) for [facility] on amion for assistance.

## 2024-09-05 ENCOUNTER — Other Ambulatory Visit (HOSPITAL_COMMUNITY): Payer: Self-pay

## 2024-09-05 LAB — BASIC METABOLIC PANEL WITH GFR
Anion gap: 7 (ref 5–15)
BUN: 9 mg/dL (ref 6–20)
CO2: 28 mmol/L (ref 22–32)
Calcium: 9.2 mg/dL (ref 8.9–10.3)
Chloride: 108 mmol/L (ref 98–111)
Creatinine, Ser: 0.68 mg/dL (ref 0.44–1.00)
GFR, Estimated: >60 mL/min
Glucose, Bld: 101 mg/dL — ABNORMAL HIGH (ref 70–99)
Potassium: 4.3 mmol/L (ref 3.5–5.1)
Sodium: 143 mmol/L (ref 135–145)

## 2024-09-05 LAB — CBC
HCT: 37.2 % (ref 36.0–46.0)
Hemoglobin: 12 g/dL (ref 12.0–15.0)
MCH: 28.7 pg (ref 26.0–34.0)
MCHC: 32.3 g/dL (ref 30.0–36.0)
MCV: 89 fL (ref 80.0–100.0)
Platelets: 222 K/uL (ref 150–400)
RBC: 4.18 MIL/uL (ref 3.87–5.11)
RDW: 13.8 % (ref 11.5–15.5)
WBC: 7 K/uL (ref 4.0–10.5)
nRBC: 0 % (ref 0.0–0.2)

## 2024-09-05 LAB — MAGNESIUM: Magnesium: 2.3 mg/dL (ref 1.7–2.4)

## 2024-09-05 LAB — URINE CULTURE: Culture: NO GROWTH

## 2024-09-05 LAB — PHOSPHORUS: Phosphorus: 3.1 mg/dL (ref 2.5–4.6)

## 2024-09-05 MED ORDER — IBUPROFEN 400 MG PO TABS
400.0000 mg | ORAL_TABLET | Freq: Three times a day (TID) | ORAL | 0 refills | Status: AC
  Filled 2024-09-05: qty 30, 10d supply, fill #0

## 2024-09-05 MED ORDER — HYDROCODONE-ACETAMINOPHEN 5-325 MG PO TABS
1.0000 | ORAL_TABLET | Freq: Four times a day (QID) | ORAL | 0 refills | Status: AC | PRN
  Filled 2024-09-05: qty 12, 3d supply, fill #0

## 2024-09-05 MED ORDER — PREGABALIN 75 MG PO CAPS
100.0000 mg | ORAL_CAPSULE | Freq: Two times a day (BID) | ORAL | Status: DC
  Administered 2024-09-05: 50 mg via ORAL
  Filled 2024-09-05 (×4): qty 1

## 2024-09-05 MED ORDER — DOXYCYCLINE HYCLATE 100 MG PO TABS
100.0000 mg | ORAL_TABLET | Freq: Two times a day (BID) | ORAL | 0 refills | Status: AC
  Filled 2024-09-05: qty 20, 10d supply, fill #0

## 2024-09-05 MED ORDER — ACIDOPHILUS PO CAPS
ORAL_CAPSULE | Freq: Three times a day (TID) | ORAL | 0 refills | Status: AC
  Filled 2024-09-05: qty 30, 10d supply, fill #0

## 2024-09-05 MED ORDER — PREGABALIN 25 MG PO CAPS
100.0000 mg | ORAL_CAPSULE | Freq: Two times a day (BID) | ORAL | 0 refills | Status: AC
  Filled 2024-09-05: qty 48, 6d supply, fill #0

## 2024-09-05 MED ORDER — IBUPROFEN 400 MG PO TABS
400.0000 mg | ORAL_TABLET | Freq: Three times a day (TID) | ORAL | Status: DC
  Administered 2024-09-05: 400 mg via ORAL
  Filled 2024-09-05: qty 1

## 2024-09-05 MED ORDER — DOXYCYCLINE HYCLATE 100 MG PO TABS
100.0000 mg | ORAL_TABLET | Freq: Two times a day (BID) | ORAL | Status: DC
  Administered 2024-09-05: 100 mg via ORAL
  Filled 2024-09-05: qty 1

## 2024-09-05 NOTE — Progress Notes (Addendum)
 Meds delivered from outpatient pharmacy. Reviewed discharge instructions with patient and husband. All questions answered. Pt transported via wheelchair to front entrance.

## 2024-09-05 NOTE — Plan of Care (Signed)

## 2024-09-05 NOTE — Discharge Summary (Signed)
 "  Physician Discharge Summary  Mackenzie Meyers FMW:[id number] DOB: 20-Nov-1970 DOA: 09/03/2024  PCP: Nena Rosina CROME, PA-C  Admit date: 09/03/2024 Discharge date: 09/05/2024  Admitted from: Home Discharge disposition: Home  Recommendations at discharge:  Complete course of antibiotics with 10 more days of oral doxycycline  with probiotics Lyrica  and ibuprofen  for pain Follow-up with podiatry as an outpatient   Subjective: Patient was seen and examined this morning.  Pleasant middle-aged Caucasian female.  Lying on bed.  Not in distress.  Feels better and ready to go home. Remains afebrile, hemodynamically stable  Brief narrative: Mackenzie Meyers is a 54 y.o. female with PMH significant for left RCC, Sjogren syndrome, obesity, OSA on CPAP, anxiety, HSV. Patient recently underwent bilateral Silastic arthroplasty of the first metatarsophalangeal joint by podiatry Dr. Janit.   At home, patient had worsening bilateral shooting pain as well as fever of 102.7 and hence presented to ED on 1/9.  In the ED, patient had temperature 100.1, heart rate in 70s, blood pressure 120s, breathing on room air. 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 Janit podiatry Presented the [facility] after foot surgery with high fever of 102.7 at home  Initial WBC count 11.7, met sepsis criteria with fever, tachycardia, leukocytosis On admission, was started on broad-spectrum IV antibiotics. Seen by podiatry.  Per note, no clinical concern of infection. WBC count has normalized. Cleared for discharge with weightbearing as tolerated in surgical shoe I chatted with podiatrist Dr. Tobie this morning.  Recommended doxycycline  for 10 days. Lyrica  and ibuprofen  for pain control Recent Labs  Lab 09/03/24 1610 09/03/24 1701 09/03/24 2200  09/04/24 0502 09/05/24 0453  WBC 11.7*  --   --  9.5 7.0  LATICACIDVEN  --  1.0  --   --   --   PROCALCITON  --   --  <0.10 <0.10  --    OCD and anxiety:  Continue prozac     Obstructive sleep apnea Cpap nightly    Vapes nicotine  containing substance Vapes, declines any nicotine  patch   HSV Valacyclovir  daily  Goals of care   Code Status: Full Code   Diet:  Diet Order             Diet general           Diet regular Room service appropriate? Yes; Fluid consistency: Thin  Diet effective now                   Nutritional status:  Body mass index is 34.17 kg/m.       Wounds:  -    Discharge Medications:   Allergies as of 09/05/2024       Reactions   Ampicillin Hives   Can tolerate amoxicillin  and penicillin   Dust Mite Extract Cough   Erythromycin Nausea And Vomiting   Mold Extract [trichophyton] Cough   Other Hives   SAUERKRAUT   Nyquil Hbp Cold & Flu [dm-doxylamine-acetaminophen ]    Flushing, body temp increases   Wine [alcohol] Hives   Ciprofloxacin  Hives, Rash        Medication List     STOP taking these medications    HYDROmorphone  4 MG tablet Commonly known as: Dilaudid    meloxicam  15 MG tablet Commonly known as: MOBIC        TAKE these medications  acetaminophen  500 MG tablet Commonly known as: TYLENOL  Take 500 mg by mouth every 6 (six) hours as needed.   diazepam  5 MG tablet Commonly known as: VALIUM  Take 1 tablet (5 mg total) by mouth every 12 (twelve) hours as needed for muscle spasms.   doxycycline  100 MG tablet Commonly known as: VIBRA -TABS Take 1 tablet (100 mg total) by mouth every 12 (twelve) hours for 10 days.   FLUoxetine  20 MG capsule Commonly known as: PROZAC  Take 20 mg by mouth daily.   HYDROcodone -acetaminophen  5-325 MG tablet Commonly known as: NORCO/VICODIN Take 1 tablet by mouth every 6 (six) hours as needed for up to 3 days for moderate pain (pain score 4-6).   ibuprofen  400 MG tablet Commonly  known as: ADVIL  Take 1 tablet (400 mg total) by mouth 3 (three) times daily.   lactobacillus acidophilus & bulgar chewable tablet Chew 1 tablet by mouth 3 (three) times daily with meals for 10 days.   levonorgestrel  20 MCG/24HR IUD Commonly known as: MIRENA  1 each by Intrauterine route once. Inserted by GYN on 03/2015 needs to be removed 03/2020.   MAGNESIUM  GLYCINATE PO Take by mouth.   pregabalin  25 MG capsule Commonly known as: LYRICA  Take 4 capsules (100 mg total) by mouth 2 (two) times daily.   valACYclovir  500 MG tablet Commonly known as: Valtrex  Take 1 tablet (500 mg total) by mouth daily.   VITAMIN B-12 PO Take by mouth.   VITAMIN D  PO Take by mouth.         Follow ups:    Follow-up Information     Nena Rosina CROME, PA-C Follow up.   Specialty: Physician Assistant Contact information: [address] [place] [address] [phone]                 Discharge Instructions:   Discharge Instructions     Call MD for:  difficulty breathing, headache or visual disturbances   Complete by: As directed    Call MD for:  extreme fatigue   Complete by: As directed    Call MD for:  hives   Complete by: As directed    Call MD for:  persistant dizziness or light-headedness   Complete by: As directed    Call MD for:  persistant nausea and vomiting   Complete by: As directed    Call MD for:  severe uncontrolled pain   Complete by: As directed    Call MD for:  temperature >100.4   Complete by: As directed    Diet general   Complete by: As directed    Discharge instructions   Complete by: As directed    Recommendations at discharge:   Complete course of antibiotics with 10 more days of oral doxycycline  with probiotics  Lyrica  and ibuprofen  for pain  Follow-up with podiatry as an outpatient  PDMP reviewed this encounter.   Opioid taper instructions: It is important to wean off of your opioid medication as soon as possible. If you do not need  pain medication after your surgery it is ok to stop [place] one. Opioids include: Codeine, Hydrocodone (Norco, Vicodin), Oxycodone (Percocet, oxycontin ) and hydromorphone  amongst others.  Long term and even short term use of opiods can cause: Increased pain response Dependence Constipation Depression Respiratory depression And more.  Withdrawal symptoms can include Flu like symptoms Nausea, vomiting And more Techniques to manage these symptoms Hydrate well Eat regular healthy meals Stay active Use relaxation techniques(deep breathing, meditating, yoga) Do Not substitute Alcohol to help with  tapering If you have been on opioids for less than two weeks and do not have pain than it is ok to stop all together.  Plan to wean off of opioids This plan should start within one week post op of your joint replacement. Maintain the same interval or time between taking each dose and first decrease the dose.  Cut the total daily intake of opioids by one tablet each Bick Next start to increase the time between doses. The last dose that should be eliminated is the evening dose.        General discharge instructions: Follow with Primary MD Nena Rosina CROME, PA-C in 7 days  Please request your PCP  to go over your hospital tests, procedures, radiology results at the follow up. Please get your medicines reviewed and adjusted.  Your PCP may decide to repeat certain labs or tests as needed. Do not drive, operate heavy machinery, perform activities at heights, swimming or participation in water  activities or provide baby sitting services if your were admitted for syncope or siezures until you have seen by Primary MD or a Neurologist and advised to do so again. [place]  Controlled Substance Reporting System database was reviewed. Do not drive, operate heavy machinery, perform activities at heights, swim, participate in water  activities or provide baby-sitting services while on medications for pain,  sleep and mood until your outpatient physician has reevaluated you and advised to do so again.  You are strongly recommended to comply with the dose, frequency and duration of prescribed medications. Activity: As tolerated with Full fall precautions use walker/cane & assistance as needed Avoid using any recreational substances like cigarette, tobacco, alcohol, or non-prescribed drug. If you experience worsening of your admission symptoms, develop shortness of breath, life threatening emergency, suicidal or homicidal thoughts you must seek medical attention immediately by calling 911 or calling your MD immediately  if symptoms less severe. You must read complete instructions/literature along with all the possible adverse reactions/side effects for all the medicines you take and that have been prescribed to you. Take any new medicine only after you have completely understood and accepted all the possible adverse reactions/side effects.  Wear Seat belts while driving. You were cared for by a hospitalist during your hospital stay. If you have any questions about your discharge medications or the care you received while you were in the hospital after you are discharged, you can call the unit and ask to speak with the hospitalist or the covering physician. Once you are discharged, your primary care physician will handle any further medical issues. Please note that NO REFILLS for any discharge medications will be authorized once you are discharged, as it is imperative that you return to your primary care physician (or establish a relationship with a primary care physician if you do not have one).   Increase activity slowly   Complete by: As directed        Discharge Exam:   Vitals:   09/04/24 2115 09/05/24 0143 09/05/24 0613 09/05/24 1056  BP: (!) 117/57 123/60 (!) 114/57 (!) 120/57  Pulse: 71 60 71 77  Resp: 16 16 18 16   Temp: 99.3 F (37.4 C) 98.8 F (37.1 C) 98.8 F (37.1 C) 98.7 F (37.1 C)   TempSrc: Oral Oral Oral Oral  SpO2: 94% 95% 93% 98%  Weight:      Height:        Body mass index is 34.17 kg/m.  General exam: Pleasant, middle-aged Caucasian female.  Pain controlled  Skin: No rashes, lesions or ulcers. HEENT: Atraumatic, normocephalic, no obvious bleeding Lungs: Clear to auscultation bilaterally,  CVS: S1, S2, no murmur,   GI/Abd: Soft, nontender, nondistended, bowel sound present,   CNS: Alert, awake, oriented x 3 Psychiatry: Mood appropriate Extremities: No pedal edema, no calf tenderness,.  Foot wound remains dressed   The results of significant diagnostics from this hospitalization (including imaging, microbiology, ancillary and laboratory) are listed below for reference.    Procedures and Diagnostic Studies:   DG Chest 1 View Result Date: 09/03/2024 CLINICAL DATA:  Fever. EXAM: CHEST  1 VIEW COMPARISON:  September 19, 2018 FINDINGS: The heart size and mediastinal contours are within normal limits. Low lung volumes are noted. Mild linear atelectasis and/or infiltrate is seen within the left lung base. No pleural effusion or pneumothorax is identified. The visualized skeletal structures are unremarkable. IMPRESSION: Low lung volumes with mild left basilar linear atelectasis and/or infiltrate. Electronically Signed   By: Suzen Dials M.D.   On: 09/03/2024 18:17     Labs:   Basic Metabolic Panel: Recent Labs  Lab 09/03/24 1610 09/04/24 0502 09/05/24 0453  NA 137 138 143  K 3.8 3.8 4.3  CL 102 104 108  CO2 25 24 28   GLUCOSE 170* 150* 101*  BUN 12 8 9   CREATININE 0.72 0.67 0.68  CALCIUM  9.5 8.9 9.2  MG  --   --  2.3  PHOS  --   --  3.1   GFR Estimated Creatinine Clearance: 90.5 mL/min (by C-G formula based on SCr of 0.68 mg/dL). Liver Function Tests: Recent Labs  Lab 09/03/24 1610  AST 24  ALT 16  ALKPHOS 82  BILITOT 0.6  PROT 7.4  ALBUMIN 4.2   No results for input(s): LIPASE, AMYLASE in the last 168 hours. No results for  input(s): AMMONIA in the last 168 hours. Coagulation profile Recent Labs  Lab 09/03/24 1610  INR 0.9    CBC: Recent Labs  Lab 09/03/24 1610 09/04/24 0502 09/05/24 0453  WBC 11.7* 9.5 7.0  NEUTROABS 8.2*  --   --   HGB 14.0 12.2 12.0  HCT 43.0 37.9 37.2  MCV 87.8 88.3 89.0  PLT 287 225 222   Cardiac Enzymes: No results for input(s): CKTOTAL, CKMB, CKMBINDEX, TROPONINI in the last 168 hours. BNP: Invalid input(s): POCBNP CBG: No results for input(s): GLUCAP in the last 168 hours. D-Dimer No results for input(s): DDIMER in the last 72 hours. Hgb A1c No results for input(s): HGBA1C in the last 72 hours. Lipid Profile No results for input(s): CHOL, HDL, LDLCALC, TRIG, CHOLHDL, LDLDIRECT in the last 72 hours. Thyroid  function studies No results for input(s): TSH, T4TOTAL, T3FREE, THYROIDAB in the last 72 hours.  Invalid input(s): FREET3 Anemia work up No results for input(s): VITAMINB12, FOLATE, FERRITIN, TIBC, IRON, RETICCTPCT in the last 72 hours. Microbiology Recent Results (from the past 240 hours)  Culture, blood (Routine x 2)     Status: None (Preliminary result)   Collection Time: 09/03/24  4:50 PM   Specimen: BLOOD RIGHT ARM  Result Value Ref Range Status   Specimen Description BLOOD RIGHT ARM  Final   Special Requests   Final    BOTTLES DRAWN AEROBIC AND ANAEROBIC Blood Culture adequate volume   Culture   Final    NO GROWTH 2 DAYS Performed at [facility], [address], [place], [place] [address]    Report Status PENDING  Incomplete  Resp panel by RT-PCR (RSV, Flu A&B,  Covid) Anterior Nasal Swab     Status: None   Collection Time: 09/03/24  5:16 PM   Specimen: Anterior Nasal Swab  Result Value Ref Range Status   SARS Coronavirus 2 by RT PCR NEGATIVE NEGATIVE Final    Comment: (NOTE) SARS-CoV-2 target nucleic acids are NOT DETECTED.  The SARS-CoV-2 RNA is generally detectable in upper  respiratory specimens during the acute phase of infection. The lowest concentration of SARS-CoV-2 viral copies this assay can detect is 138 copies/mL. A negative result does not preclude SARS-Cov-2 infection and should not be used as the sole basis for treatment or other patient management decisions. A negative result may occur with  improper specimen collection/handling, submission of specimen other than nasopharyngeal swab, presence of viral mutation(s) within the areas targeted by this assay, and inadequate number of viral copies(<138 copies/mL). A negative result must be combined with clinical observations, patient history, and epidemiological information. The expected result is Negative.  Fact Sheet for Patients:  bloggercourse.com  Fact Sheet for Healthcare Providers:  seriousbroker.it  This test is no t yet approved or cleared by the [place]  FDA and  has been authorized for detection and/or diagnosis of SARS-CoV-2 by FDA under an Emergency Use Authorization (EUA). This EUA will remain  in effect (meaning this test can be used) for the duration of the COVID-19 declaration under Section 564(b)(1) of the Act, 21 U.S.C.section 360bbb-3(b)(1), unless the authorization is terminated  or revoked sooner.       Influenza A by PCR NEGATIVE NEGATIVE Final   Influenza B by PCR NEGATIVE NEGATIVE Final    Comment: (NOTE) The Xpert Xpress SARS-CoV-2/FLU/RSV plus assay is intended as an aid in the diagnosis of influenza from Nasopharyngeal swab specimens and should not be used as a sole basis for treatment. Nasal washings and aspirates are unacceptable for Xpert Xpress SARS-CoV-2/FLU/RSV testing.  Fact Sheet for Patients: bloggercourse.com  Fact Sheet for Healthcare Providers: seriousbroker.it  This test is not yet approved or cleared by the [place]  FDA and has been  authorized for detection and/or diagnosis of SARS-CoV-2 by FDA under an Emergency Use Authorization (EUA). This EUA will remain in effect (meaning this test can be used) for the duration of the COVID-19 declaration under Section 564(b)(1) of the Act, 21 U.S.C. section 360bbb-3(b)(1), unless the authorization is terminated or revoked.     Resp Syncytial Virus by PCR NEGATIVE NEGATIVE Final    Comment: (NOTE) Fact Sheet for Patients: bloggercourse.com  Fact Sheet for Healthcare Providers: seriousbroker.it  This test is not yet approved or cleared by the [place]  FDA and has been authorized for detection and/or diagnosis of SARS-CoV-2 by FDA under an Emergency Use Authorization (EUA). This EUA will remain in effect (meaning this test can be used) for the duration of the COVID-19 declaration under Section 564(b)(1) of the Act, 21 U.S.C. section 360bbb-3(b)(1), unless the authorization is terminated or revoked.  Performed at [facility], [address] [place], [place] [address]   Culture, blood (Routine x 2)     Status: None (Preliminary result)   Collection Time: 09/03/24  5:59 PM   Specimen: BLOOD LEFT HAND  Result Value Ref Range Status   Specimen Description   Final    BLOOD LEFT HAND BOTTLES DRAWN AEROBIC AND ANAEROBIC Performed at [facility], [address] [place], [place] [address]    Special Requests   Final    Blood Culture adequate volume Performed at [facility], [address] [place], [place] [address]    Culture   Final    NO GROWTH 2 DAYS Performed at [facility], [address] [place], [place] [address]    Report Status PENDING  Incomplete  Urine Culture (for pregnant, neutropenic or urologic patients or patients with an indwelling urinary catheter)     Status: None   Collection Time: 09/03/24  9:59 PM   Specimen: Urine, Clean Catch   Result Value Ref Range Status   Specimen Description   Final    URINE, CLEAN CATCH Performed at [facility], [address], [place], [place] [address]    Special Requests   Final    NONE Performed at [facility], [address], [place], [place] [address]    Culture   Final    NO GROWTH Performed at [facility], [address] [place], [place] [address]    Report Status 09/05/2024 FINAL  Final    Time coordinating discharge: 45 minutes  Signed: Henrik Orihuela  [facility] 09/05/2024, 11:17 AM  "

## 2024-09-08 ENCOUNTER — Ambulatory Visit (INDEPENDENT_AMBULATORY_CARE_PROVIDER_SITE_OTHER): Payer: PRIVATE HEALTH INSURANCE | Admitting: Podiatry

## 2024-09-08 ENCOUNTER — Ambulatory Visit: Payer: PRIVATE HEALTH INSURANCE

## 2024-09-08 ENCOUNTER — Encounter: Payer: Self-pay | Admitting: Podiatry

## 2024-09-08 VITALS — Ht 64.0 in | Wt 199.8 lb

## 2024-09-08 DIAGNOSIS — M2021 Hallux rigidus, right foot: Secondary | ICD-10-CM | POA: Diagnosis not present

## 2024-09-08 DIAGNOSIS — M2022 Hallux rigidus, left foot: Secondary | ICD-10-CM | POA: Diagnosis not present

## 2024-09-08 LAB — CULTURE, BLOOD (ROUTINE X 2)
Culture: NO GROWTH
Culture: NO GROWTH
Special Requests: ADEQUATE
Special Requests: ADEQUATE

## 2024-09-15 ENCOUNTER — Ambulatory Visit (INDEPENDENT_AMBULATORY_CARE_PROVIDER_SITE_OTHER): Payer: PRIVATE HEALTH INSURANCE | Admitting: Podiatry

## 2024-09-15 ENCOUNTER — Encounter: Payer: Self-pay | Admitting: Podiatry

## 2024-09-15 VITALS — Ht 64.0 in | Wt 199.8 lb

## 2024-09-15 DIAGNOSIS — M2021 Hallux rigidus, right foot: Secondary | ICD-10-CM

## 2024-09-15 NOTE — Progress Notes (Signed)
 "  Chief Complaint  Patient presents with   Routine Post Op     POV #2 DOS 09/02/2024 BL GREAT TOE ARTHROPLASTY W/ IMPLANT, RT FOOT HARDWARE REMOVAL, states the pain is a lot better then before.     Subjective:  Patient presents today status post bilateral great toe arthroplasty with implant and removal of K wire right first metatarsal.  DOS: 09/02/2024.  Patient doing well.  WBAT surgical shoes bilateral  Past Medical History:  Diagnosis Date   Anxiety    currently managed on CBD oil, was on medication briefly but made her OCD worse   Arthritis    hands, DDD, and back 12-2017   Breast mass, right 09/2012   removed because it was painful, was found to be calcified scar tissue   Cancer (HCC)    left renal mass   Cervical dysplasia age 7   Complication of anesthesia    Cries coming out of anesthesia    Family history of adverse reaction to anesthesia    Maternal grandmother PONV   Geographical tongue    Gestational diabetes    gestational   Heart murmur    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   History of kidney stones 10/2011   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   Normal cytology   Hx of migraines    OCD (obsessive compulsive disorder)    cleanliness with home and workspace   Rosacea    Sinus congestion 10/12/2012   Sleep apnea 04/2017   patient is wearing C-pap   Tobacco abuse 1989   max was 1.5 packs per [place]   Vitamin D  deficiency 10/2013   Value 29   Vitiligo     Past Surgical History:  Procedure Laterality Date   ABDOMINOPLASTY  2008   BREAST BIOPSY Right 10/15/2012   Procedure: BREAST BIOPSY;  Surgeon: Sherlean JINNY Laughter, MD;  Location: [facility];  Service: General;  Laterality: Right;  removal right breast mass   BREAST EXCISIONAL BIOPSY Right    BREAST SURGERY     Lift   CESAREAN SECTION  2004, 2007   CESAREAN SECTION N/A 02/07/2015   Procedure: CESAREAN SECTION;  Surgeon: Jolene Gaskins, MD;  Location: [facility];   Service: Obstetrics;  Laterality: N/A;  EDD: 02/13/15    dialation and cutterage     FOOT SURGERY Right 06/19/2021   GYNECOLOGIC CRYOSURGERY  age 54   HERNIA REPAIR  03/03/2019   INTRAUTERINE DEVICE INSERTION  03/2015   Mirena  at [facility]   MICRODISCECTOMY LUMBAR     12-2017    REDUCTION MAMMAPLASTY Bilateral    ROBOTIC ASSITED PARTIAL NEPHRECTOMY Left 09/17/2018   Procedure: XI ROBOTIC ASSITED PARTIAL NEPHRECTOMY;  Surgeon: Sherrilee Belvie CROME, MD;  Location: [facility];  Service: Urology;  Laterality: Left;   TONSILLECTOMY  2000    Allergies[1]  Objective/Physical Exam Neurovascular status intact.  Incision well coapted with sutures intact. No sign of infectious process noted. No dehiscence. No active bleeding noted.  Moderate edema noted to the surgical extremity.  Radiographic Exam B/L feet 09/08/2024:  Arthroplasty with osteotomies to the first MTP bilateral with Silastic implant.  No complicating features noted  Assessment: 1. s/p bilateral great toe arthroplasty with implant and removal of K wire fixation RT first metatarsal. DOS: 09/02/2024   Plan of Care:  -Patient was evaluated.  -Sutures removed -Okay to begin washing and showering getting the foot wet -Continue minimal WBAT postop shoes bilateral -Return to  clinic 2 weeks follow-up x-ray  Thresa EMERSON Sar, DPM [facility]  Dr. Thresa EMERSON Sar, DPM    [address], [place] [address]                Office [phone]  Fax [phone]           [1]  Allergies Allergen Reactions   Ampicillin Hives    Can tolerate amoxicillin  and penicillin   Dust Mite Extract Cough   Erythromycin Nausea And Vomiting   Mold Extract [Trichophyton] Cough   Other Hives    SAUERKRAUT   Nyquil Hbp Cold & Flu [Dm-Doxylamine-Acetaminophen ]     Flushing, body temp increases   Wine [Alcohol] Hives   Ciprofloxacin  Hives and Rash   "

## 2024-09-21 NOTE — Progress Notes (Signed)
 "  Chief Complaint  Patient presents with   Routine Post Op     POV #1 DOS 09/02/2024 BL GREAT TOE ARTHROPLASTY W/ IMPLANT, RT FOOT HARDWARE REMOVAL, pt states everything is going well now pain level at a 5, states she was in the hospital over the weekend with sepsis, feeling a lot better now.       Subjective:  Patient presents today status post bilateral great toe arthroplasty with implant.  DOS: 09/02/2024.  Doing well.  Minimal WBAT bilateral.  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   History of kidney stones 10/2011   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   Normal cytology   Hx of migraines    OCD (obsessive compulsive disorder)    cleanliness with home and workspace   Rosacea    Sinus congestion 10/12/2012   Sleep apnea 04/2017   patient is wearing C-pap   Tobacco abuse 1989   max was 1.5 packs per [place]   Vitamin D  deficiency 10/2013   Value 29   Vitiligo     Past Surgical History:  Procedure Laterality Date   ABDOMINOPLASTY  2008   BREAST BIOPSY Right 10/15/2012   Procedure: BREAST BIOPSY;  Surgeon: Sherlean JINNY Laughter, MD;  Location: [facility];  Service: General;  Laterality: Right;  removal right breast mass   BREAST EXCISIONAL BIOPSY Right    BREAST SURGERY     Lift   CESAREAN SECTION  2004, 2007   CESAREAN SECTION N/A 02/07/2015   Procedure: CESAREAN SECTION;  Surgeon:  Jolene Gaskins, MD;  Location: [facility];  Service: Obstetrics;  Laterality: N/A;  EDD: 02/13/15    dialation and cutterage     FOOT SURGERY Right 06/19/2021   GYNECOLOGIC CRYOSURGERY  age 32   HERNIA REPAIR  03/03/2019   INTRAUTERINE DEVICE INSERTION  03/2015   [facility]rena  at [facility]   MICRODISCECTOMY LUMBAR     12-2017    REDUCTION MAMMAPLASTY Bilateral    ROBOTIC ASSITED PARTIAL NEPHRECTOMY Left 09/17/2018   Procedure: XI ROBOTIC ASSITED PARTIAL NEPHRECTOMY;  Surgeon: Sherrilee Belvie CROME, MD;  Location: [facility];  Service: Urology;  Laterality: Left;   TONSILLECTOMY  2000    Allergies[1]  Objective/Physical Exam Neurovascular status intact.  Incision well coapted with sutures intact. No sign of infectious process noted. No dehiscence. No active bleeding noted.  Moderate edema noted to the surgical extremity.  Radiographic Exam B/L feet 09/21/2024:  Arthroplasty noted to the first MTP bilateral with clean osteotomies and well-seated Silastic implant  Assessment: 1. s/p bilateral great toe arthroplasty with implant.  ROH RT first metatarsal. DOS: 09/02/2024   Plan of Care:  -Patient was evaluated. X-rays reviewed - Dressings changed.  Leave clean dry and intact x 1 week -Continue minimal  WBAT postop shoes -Return to clinic 1 week suture removal   Thresa EMERSON Sar, DPM [facility]  Dr. Thresa EMERSON Sar, DPM    [address], [place] [address]                Office [phone]  Fax [phone]         [1]  Allergies Allergen Reactions   Ampicillin Hives    Can tolerate amoxicillin  and penicillin   Dust Mite Extract Cough   Erythromycin Nausea And Vomiting   Mold Extract [Trichophyton] Cough   Other Hives    SAUERKRAUT   Nyquil Hbp Cold & Flu [Dm-Doxylamine-Acetaminophen ]     Flushing, body temp increases   Wine [Alcohol] Hives   Ciprofloxacin  Hives and Rash   "

## 2024-09-29 ENCOUNTER — Encounter: Payer: PRIVATE HEALTH INSURANCE | Admitting: Podiatry

## 2024-09-29 ENCOUNTER — Encounter: Payer: Self-pay | Admitting: Podiatry

## 2024-09-29 ENCOUNTER — Ambulatory Visit: Payer: PRIVATE HEALTH INSURANCE | Admitting: Podiatry

## 2024-09-29 ENCOUNTER — Ambulatory Visit (INDEPENDENT_AMBULATORY_CARE_PROVIDER_SITE_OTHER): Payer: PRIVATE HEALTH INSURANCE

## 2024-09-29 VITALS — Ht 64.0 in | Wt 199.8 lb

## 2024-09-29 DIAGNOSIS — M2022 Hallux rigidus, left foot: Secondary | ICD-10-CM | POA: Diagnosis not present

## 2024-09-29 DIAGNOSIS — M2021 Hallux rigidus, right foot: Secondary | ICD-10-CM | POA: Diagnosis not present

## 2024-09-29 NOTE — Progress Notes (Unsigned)
 "  Chief Complaint  Patient presents with   Routine Post Op    POV #3 DOS 09/02/2024 BL GREAT TOE ARTHROPLASTY W/ IMPLANT, RT FOOT HARDWARE REMOVAL, she states everything is going well, has no complaints.     Subjective:  Patient presents today status post bilateral great toe arthroplasty with implant and removal of K wire right first metatarsal.  DOS: 09/02/2024.  Patient doing well.  WBAT surgical shoes bilateral  Past Medical History:  Diagnosis Date   Anxiety    currently managed on CBD oil, was on medication briefly but made her OCD worse   Arthritis    hands, DDD, and back 12-2017   Breast mass, right 09/2012   removed because it was painful, was found to be calcified scar tissue   Cancer (HCC)    left renal mass   Cervical dysplasia age 8   Complication of anesthesia    Cries coming out of anesthesia    Family history of adverse reaction to anesthesia    Maternal grandmother PONV   Geographical tongue    Gestational diabetes    gestational   Heart murmur    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   History of kidney stones 10/2011   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   Normal cytology   Hx of migraines    OCD (obsessive compulsive disorder)    cleanliness with home and workspace   Rosacea    Sinus congestion 10/12/2012   Sleep apnea 04/2017   patient is wearing C-pap   Tobacco abuse 1989   max was 1.5 packs per [place]   Vitamin D  deficiency 10/2013   Value 29   Vitiligo     Past Surgical History:  Procedure Laterality Date   ABDOMINOPLASTY  2008   BREAST BIOPSY Right 10/15/2012   Procedure: BREAST BIOPSY;  Surgeon: Sherlean JINNY Laughter, MD;  Location: [facility];  Service: General;  Laterality: Right;  removal right breast mass   BREAST EXCISIONAL BIOPSY Right    BREAST SURGERY     Lift   CESAREAN SECTION  2004, 2007   CESAREAN SECTION N/A 02/07/2015   Procedure: CESAREAN SECTION;  Surgeon: Jolene Gaskins, MD;  Location: [facility];  Service: Obstetrics;  Laterality: N/A;  EDD: 02/13/15    dialation and cutterage     FOOT SURGERY Right 06/19/2021   GYNECOLOGIC CRYOSURGERY  age 21   HERNIA REPAIR  03/03/2019   INTRAUTERINE DEVICE INSERTION  03/2015   Mirena  at [facility]   MICRODISCECTOMY LUMBAR     12-2017    REDUCTION MAMMAPLASTY Bilateral    ROBOTIC ASSITED PARTIAL NEPHRECTOMY Left 09/17/2018   Procedure: XI ROBOTIC ASSITED PARTIAL NEPHRECTOMY;  Surgeon: Sherrilee Belvie CROME, MD;  Location: [facility];  Service: Urology;  Laterality: Left;   TONSILLECTOMY  2000    Allergies[1]  Objective/Physical Exam Neurovascular status intact.  Incision well coapted with sutures intact. No sign of infectious process noted. No dehiscence. No active bleeding noted.  Moderate edema noted to the surgical extremity.  Radiographic Exam B/L feet 09/08/2024:  Arthroplasty with osteotomies to the first MTP bilateral with Silastic implant.  No complicating features noted  Assessment: 1. s/p bilateral great toe arthroplasty with implant and removal of K wire fixation RT first metatarsal. DOS: 09/02/2024   Plan of Care:  -Patient was evaluated.  -Sutures removed -Okay to begin washing and showering getting the foot wet -Continue minimal WBAT postop shoes bilateral -Return to clinic  2 weeks follow-up x-ray  Thresa EMERSON Sar, DPM [facility]  Dr. Thresa EMERSON Sar, DPM    [address], [place] [address]                Office [phone]  Fax [phone]           [1]  Allergies Allergen Reactions   Ampicillin Hives    Can tolerate amoxicillin  and penicillin   Dust Mite Extract Cough   Erythromycin Nausea And Vomiting   Mold Extract [Trichophyton] Cough   Other Hives    SAUERKRAUT   Nyquil Hbp Cold & Flu [Dm-Doxylamine-Acetaminophen ]     Flushing, body temp increases   Wine [Alcohol] Hives   Ciprofloxacin  Hives and Rash   "

## 2024-11-29 ENCOUNTER — Encounter: Payer: PRIVATE HEALTH INSURANCE | Admitting: Podiatry
